# Patient Record
Sex: Male | Born: 1935 | Race: White | Hispanic: No | Smoking: Never smoker
Health system: Southern US, Community
[De-identification: ages and names within clinical notes are randomized; demographics above are authoritative.]

## PROBLEM LIST (undated history)

## (undated) DIAGNOSIS — I639 Cerebral infarction, unspecified: Secondary | ICD-10-CM

## (undated) DIAGNOSIS — E785 Hyperlipidemia, unspecified: Secondary | ICD-10-CM

## (undated) DIAGNOSIS — J189 Pneumonia, unspecified organism: Secondary | ICD-10-CM

## (undated) DIAGNOSIS — I482 Chronic atrial fibrillation, unspecified: Secondary | ICD-10-CM

## (undated) DIAGNOSIS — I10 Essential (primary) hypertension: Secondary | ICD-10-CM

## (undated) DIAGNOSIS — N183 Chronic kidney disease, stage 3 unspecified: Secondary | ICD-10-CM

## (undated) DIAGNOSIS — I4891 Unspecified atrial fibrillation: Secondary | ICD-10-CM

## (undated) DIAGNOSIS — Z8719 Personal history of other diseases of the digestive system: Secondary | ICD-10-CM

## (undated) HISTORY — PX: JOINT REPLACEMENT: SHX530

---

## 1995-08-29 HISTORY — PX: ROTATOR CUFF REPAIR: SHX139

## 1998-12-30 ENCOUNTER — Emergency Department (HOSPITAL_COMMUNITY): Admission: EM | Admit: 1998-12-30 | Discharge: 1998-12-30 | Payer: Self-pay

## 2002-06-22 ENCOUNTER — Encounter: Payer: Self-pay | Admitting: *Deleted

## 2002-06-22 ENCOUNTER — Ambulatory Visit (HOSPITAL_COMMUNITY): Admission: RE | Admit: 2002-06-22 | Discharge: 2002-06-22 | Payer: Self-pay | Admitting: *Deleted

## 2004-05-01 ENCOUNTER — Encounter: Admission: RE | Admit: 2004-05-01 | Discharge: 2004-05-01 | Payer: Self-pay | Admitting: Orthopedic Surgery

## 2004-05-02 ENCOUNTER — Encounter: Admission: RE | Admit: 2004-05-02 | Discharge: 2004-05-02 | Payer: Self-pay | Admitting: Orthopedic Surgery

## 2006-10-28 HISTORY — PX: ANAL FISTULOTOMY: SHX6423

## 2006-11-04 ENCOUNTER — Ambulatory Visit (HOSPITAL_COMMUNITY): Admission: RE | Admit: 2006-11-04 | Discharge: 2006-11-04 | Payer: Self-pay | Admitting: General Surgery

## 2007-03-29 HISTORY — PX: TOTAL KNEE ARTHROPLASTY: SHX125

## 2007-04-27 ENCOUNTER — Inpatient Hospital Stay (HOSPITAL_COMMUNITY): Admission: RE | Admit: 2007-04-27 | Discharge: 2007-05-04 | Payer: Self-pay | Admitting: Orthopedic Surgery

## 2007-07-23 ENCOUNTER — Ambulatory Visit: Payer: Self-pay | Admitting: Gastroenterology

## 2007-07-27 ENCOUNTER — Encounter: Payer: Self-pay | Admitting: Gastroenterology

## 2007-07-27 ENCOUNTER — Ambulatory Visit (HOSPITAL_COMMUNITY): Admission: RE | Admit: 2007-07-27 | Discharge: 2007-07-27 | Payer: Self-pay | Admitting: Gastroenterology

## 2007-07-29 ENCOUNTER — Ambulatory Visit: Payer: Self-pay | Admitting: Gastroenterology

## 2008-01-08 DIAGNOSIS — D518 Other vitamin B12 deficiency anemias: Secondary | ICD-10-CM | POA: Insufficient documentation

## 2008-01-08 DIAGNOSIS — K603 Anal fistula: Secondary | ICD-10-CM | POA: Insufficient documentation

## 2008-04-19 ENCOUNTER — Emergency Department (HOSPITAL_COMMUNITY): Admission: EM | Admit: 2008-04-19 | Discharge: 2008-04-19 | Payer: Self-pay | Admitting: Emergency Medicine

## 2008-04-20 ENCOUNTER — Inpatient Hospital Stay (HOSPITAL_COMMUNITY): Admission: RE | Admit: 2008-04-20 | Discharge: 2008-04-22 | Payer: Self-pay | Admitting: Emergency Medicine

## 2008-04-20 DIAGNOSIS — I639 Cerebral infarction, unspecified: Secondary | ICD-10-CM

## 2008-04-20 HISTORY — DX: Cerebral infarction, unspecified: I63.9

## 2008-04-21 ENCOUNTER — Encounter (INDEPENDENT_AMBULATORY_CARE_PROVIDER_SITE_OTHER): Payer: Self-pay | Admitting: Internal Medicine

## 2008-04-21 ENCOUNTER — Ambulatory Visit: Payer: Self-pay | Admitting: Vascular Surgery

## 2009-03-28 HISTORY — PX: TOTAL KNEE ARTHROPLASTY: SHX125

## 2009-04-10 ENCOUNTER — Inpatient Hospital Stay (HOSPITAL_COMMUNITY): Admission: RE | Admit: 2009-04-10 | Discharge: 2009-04-18 | Payer: Self-pay | Admitting: Orthopedic Surgery

## 2009-12-13 ENCOUNTER — Emergency Department (HOSPITAL_COMMUNITY): Admission: EM | Admit: 2009-12-13 | Discharge: 2009-12-13 | Payer: Self-pay | Admitting: Emergency Medicine

## 2010-01-23 ENCOUNTER — Inpatient Hospital Stay (HOSPITAL_COMMUNITY): Admission: EM | Admit: 2010-01-23 | Discharge: 2010-01-24 | Payer: Self-pay | Admitting: Emergency Medicine

## 2010-01-29 ENCOUNTER — Telehealth (INDEPENDENT_AMBULATORY_CARE_PROVIDER_SITE_OTHER): Payer: Self-pay | Admitting: *Deleted

## 2010-08-21 ENCOUNTER — Emergency Department (HOSPITAL_COMMUNITY): Admission: EM | Admit: 2010-08-21 | Discharge: 2010-08-21 | Payer: Self-pay | Admitting: Emergency Medicine

## 2010-11-18 ENCOUNTER — Encounter: Payer: Self-pay | Admitting: Emergency Medicine

## 2010-11-27 NOTE — Procedures (Signed)
Summary: Gastroenterology colon  Gastroenterology colon   Imported By: Donneta Romberg 01/08/2008 16:45:09  _____________________________________________________________________  External Attachment:    Type:   Image     Comment:   External Document

## 2010-11-27 NOTE — Progress Notes (Signed)
  Phone Note Other Incoming   Request: Send information Summary of Call: Request received from [facility] forwarded to [employer].       

## 2011-01-20 LAB — PROTIME-INR
INR: 1.2 (ref 0.00–1.49)
Prothrombin Time: 15.1 seconds (ref 11.6–15.2)

## 2011-01-20 LAB — CBC
HCT: 34 % — ABNORMAL LOW (ref 39.0–52.0)
HCT: 35 % — ABNORMAL LOW (ref 39.0–52.0)
HCT: 39.2 % (ref 39.0–52.0)
Hemoglobin: 11.5 g/dL — ABNORMAL LOW (ref 13.0–17.0)
Hemoglobin: 11.8 g/dL — ABNORMAL LOW (ref 13.0–17.0)
Hemoglobin: 13.2 g/dL (ref 13.0–17.0)
MCHC: 32.9 g/dL (ref 30.0–36.0)
MCHC: 33.6 g/dL (ref 30.0–36.0)
MCHC: 34.7 g/dL (ref 30.0–36.0)
MCV: 94.5 fL (ref 78.0–100.0)
MCV: 96.1 fL (ref 78.0–100.0)
MCV: 96.4 fL (ref 78.0–100.0)
Platelets: 173 10*3/uL (ref 150–400)
Platelets: 189 10*3/uL (ref 150–400)
Platelets: 249 10*3/uL (ref 150–400)
RBC: 3.6 MIL/uL — ABNORMAL LOW (ref 4.22–5.81)
RBC: 3.64 MIL/uL — ABNORMAL LOW (ref 4.22–5.81)
RBC: 4.07 MIL/uL — ABNORMAL LOW (ref 4.22–5.81)
RDW: 12.5 % (ref 11.5–15.5)
RDW: 12.5 % (ref 11.5–15.5)
RDW: 13.3 % (ref 11.5–15.5)
WBC: 12.1 10*3/uL — ABNORMAL HIGH (ref 4.0–10.5)
WBC: 18.2 10*3/uL — ABNORMAL HIGH (ref 4.0–10.5)
WBC: 20.9 10*3/uL — ABNORMAL HIGH (ref 4.0–10.5)

## 2011-01-20 LAB — HEMOCCULT GUIAC POC 1CARD (OFFICE)
Fecal Occult Bld: NEGATIVE
Fecal Occult Bld: POSITIVE

## 2011-01-20 LAB — URINALYSIS, ROUTINE W REFLEX MICROSCOPIC
Bilirubin Urine: NEGATIVE
Glucose, UA: NEGATIVE mg/dL
Hgb urine dipstick: NEGATIVE
Ketones, ur: NEGATIVE mg/dL
Leukocytes, UA: NEGATIVE
Nitrite: NEGATIVE
Protein, ur: 30 mg/dL — AB
Specific Gravity, Urine: 1.018 (ref 1.005–1.030)
Urobilinogen, UA: 1 mg/dL (ref 0.0–1.0)
pH: 5 (ref 5.0–8.0)

## 2011-01-20 LAB — COMPREHENSIVE METABOLIC PANEL
ALT: 16 U/L (ref 0–53)
ALT: 19 U/L (ref 0–53)
AST: 29 U/L (ref 0–37)
AST: 35 U/L (ref 0–37)
Albumin: 2.7 g/dL — ABNORMAL LOW (ref 3.5–5.2)
Albumin: 3.6 g/dL (ref 3.5–5.2)
Alkaline Phosphatase: 47 U/L (ref 39–117)
Alkaline Phosphatase: 70 U/L (ref 39–117)
BUN: 30 mg/dL — ABNORMAL HIGH (ref 6–23)
BUN: 31 mg/dL — ABNORMAL HIGH (ref 6–23)
CO2: 16 mEq/L — ABNORMAL LOW (ref 19–32)
CO2: 17 mEq/L — ABNORMAL LOW (ref 19–32)
Calcium: 7.2 mg/dL — ABNORMAL LOW (ref 8.4–10.5)
Calcium: 8.6 mg/dL (ref 8.4–10.5)
Chloride: 112 mEq/L (ref 96–112)
Chloride: 120 mEq/L — ABNORMAL HIGH (ref 96–112)
Creatinine, Ser: 1.76 mg/dL — ABNORMAL HIGH (ref 0.4–1.5)
Creatinine, Ser: 2 mg/dL — ABNORMAL HIGH (ref 0.4–1.5)
GFR calc Af Amer: 39 mL/min — ABNORMAL LOW (ref >60)
GFR calc Af Amer: 45 mL/min — ABNORMAL LOW (ref >60)
GFR calc non Af Amer: 32 mL/min — ABNORMAL LOW (ref >60)
GFR calc non Af Amer: 37 mL/min — ABNORMAL LOW (ref >60)
Glucose, Bld: 130 mg/dL — ABNORMAL HIGH (ref 70–99)
Glucose, Bld: 136 mg/dL — ABNORMAL HIGH (ref 70–99)
Potassium: 4.3 mEq/L (ref 3.5–5.1)
Potassium: 4.4 mEq/L (ref 3.5–5.1)
Sodium: 142 mEq/L (ref 135–145)
Sodium: 142 mEq/L (ref 135–145)
Total Bilirubin: 0.4 mg/dL (ref 0.3–1.2)
Total Bilirubin: 0.4 mg/dL (ref 0.3–1.2)
Total Protein: 5.3 g/dL — ABNORMAL LOW (ref 6.0–8.3)
Total Protein: 6.9 g/dL (ref 6.0–8.3)

## 2011-01-20 LAB — TYPE AND SCREEN
ABO/RH(D): O POS
Antibody Screen: NEGATIVE

## 2011-01-20 LAB — POCT CARDIAC MARKERS
CKMB, poc: <1 ng/mL — ABNORMAL LOW (ref 1.0–8.0)
Myoglobin, poc: >500 ng/mL (ref 12–200)
Troponin i, poc: <0.05 ng/mL (ref 0.00–0.09)

## 2011-01-20 LAB — LACTIC ACID, PLASMA
Lactic Acid, Venous: 2.1 mmol/L (ref 0.5–2.2)
Lactic Acid, Venous: 5.5 mmol/L — ABNORMAL HIGH (ref 0.5–2.2)

## 2011-01-20 LAB — BASIC METABOLIC PANEL
BUN: 17 mg/dL (ref 6–23)
CO2: 25 mEq/L (ref 19–32)
Calcium: 7.5 mg/dL — ABNORMAL LOW (ref 8.4–10.5)
Chloride: 108 mEq/L (ref 96–112)
Creatinine, Ser: 1.32 mg/dL (ref 0.4–1.5)
GFR calc Af Amer: >60 mL/min (ref >60)
GFR calc non Af Amer: 51 mL/min — ABNORMAL LOW (ref >60)
Glucose, Bld: 111 mg/dL — ABNORMAL HIGH (ref 70–99)
Potassium: 3.4 mEq/L — ABNORMAL LOW (ref 3.5–5.1)
Sodium: 138 mEq/L (ref 135–145)

## 2011-01-20 LAB — DIFFERENTIAL
Basophils Absolute: 0 10*3/uL (ref 0.0–0.1)
Basophils Absolute: 0 10*3/uL (ref 0.0–0.1)
Basophils Absolute: 0.2 10*3/uL — ABNORMAL HIGH (ref 0.0–0.1)
Basophils Relative: 0 % (ref 0–1)
Basophils Relative: 0 % (ref 0–1)
Basophils Relative: 1 % (ref 0–1)
Eosinophils Absolute: 0 10*3/uL (ref 0.0–0.7)
Eosinophils Absolute: 0 10*3/uL (ref 0.0–0.7)
Eosinophils Absolute: 0.1 10*3/uL (ref 0.0–0.7)
Eosinophils Relative: 0 % (ref 0–5)
Eosinophils Relative: 0 % (ref 0–5)
Eosinophils Relative: 0 % (ref 0–5)
Lymphocytes Relative: 3 % — ABNORMAL LOW (ref 12–46)
Lymphocytes Relative: 8 % — ABNORMAL LOW (ref 12–46)
Lymphocytes Relative: 9 % — ABNORMAL LOW (ref 12–46)
Lymphs Abs: 0.5 10*3/uL — ABNORMAL LOW (ref 0.7–4.0)
Lymphs Abs: 1 10*3/uL (ref 0.7–4.0)
Lymphs Abs: 1.9 10*3/uL (ref 0.7–4.0)
Monocytes Absolute: 0.4 10*3/uL (ref 0.1–1.0)
Monocytes Absolute: 0.6 10*3/uL (ref 0.1–1.0)
Monocytes Absolute: 0.7 10*3/uL (ref 0.1–1.0)
Monocytes Relative: 2 % — ABNORMAL LOW (ref 3–12)
Monocytes Relative: 4 % (ref 3–12)
Monocytes Relative: 5 % (ref 3–12)
Neutro Abs: 10.4 10*3/uL — ABNORMAL HIGH (ref 1.7–7.7)
Neutro Abs: 16.8 10*3/uL — ABNORMAL HIGH (ref 1.7–7.7)
Neutro Abs: 18.5 10*3/uL — ABNORMAL HIGH (ref 1.7–7.7)
Neutrophils Relative %: 86 % — ABNORMAL HIGH (ref 43–77)
Neutrophils Relative %: 88 % — ABNORMAL HIGH (ref 43–77)
Neutrophils Relative %: 92 % — ABNORMAL HIGH (ref 43–77)

## 2011-01-20 LAB — URINE MICROSCOPIC-ADD ON

## 2011-01-20 LAB — CULTURE, BLOOD (ROUTINE X 2)
Culture: NO GROWTH
Culture: NO GROWTH

## 2011-01-20 LAB — RAPID URINE DRUG SCREEN, HOSP PERFORMED
Amphetamines: NOT DETECTED
Barbiturates: NOT DETECTED
Benzodiazepines: POSITIVE — AB
Cocaine: NOT DETECTED
Opiates: POSITIVE — AB
Tetrahydrocannabinol: NOT DETECTED

## 2011-01-20 LAB — DIGOXIN LEVEL: Digoxin Level: 0.2 ng/mL — ABNORMAL LOW (ref 0.8–2.0)

## 2011-01-20 LAB — AMYLASE: Amylase: 164 U/L — ABNORMAL HIGH (ref 0–105)

## 2011-01-20 LAB — CREATININE, URINE, RANDOM: Creatinine, Urine: 97 mg/dL

## 2011-01-20 LAB — APTT: aPTT: 27 seconds (ref 24–37)

## 2011-01-20 LAB — SODIUM, URINE, RANDOM: Sodium, Ur: 92 mEq/L

## 2011-01-20 LAB — LIPASE, BLOOD
Lipase: 25 U/L (ref 11–59)
Lipase: 844 U/L — ABNORMAL HIGH (ref 11–59)
Lipase: 98 U/L — ABNORMAL HIGH (ref 11–59)

## 2011-01-20 LAB — MRSA PCR SCREENING: MRSA by PCR: NEGATIVE

## 2011-01-20 LAB — LACTATE DEHYDROGENASE: LDH: 137 U/L (ref 94–250)

## 2011-02-04 LAB — CBC
HCT: 23.9 % — ABNORMAL LOW (ref 39.0–52.0)
HCT: 25.3 % — ABNORMAL LOW (ref 39.0–52.0)
HCT: 27.4 % — ABNORMAL LOW (ref 39.0–52.0)
HCT: 27.7 % — ABNORMAL LOW (ref 39.0–52.0)
HCT: 31.3 % — ABNORMAL LOW (ref 39.0–52.0)
HCT: 29.3 % — ABNORMAL LOW (ref 39.0–52.0)
HCT: 36.5 % — ABNORMAL LOW (ref 39.0–52.0)
Hemoglobin: 10.6 g/dL — ABNORMAL LOW (ref 13.0–17.0)
Hemoglobin: 8.2 g/dL — ABNORMAL LOW (ref 13.0–17.0)
Hemoglobin: 8.7 g/dL — ABNORMAL LOW (ref 13.0–17.0)
Hemoglobin: 9.4 g/dL — ABNORMAL LOW (ref 13.0–17.0)
Hemoglobin: 9.4 g/dL — ABNORMAL LOW (ref 13.0–17.0)
Hemoglobin: 12.9 g/dL — ABNORMAL LOW (ref 13.0–17.0)
Hemoglobin: 9.9 g/dL — ABNORMAL LOW (ref 13.0–17.0)
MCHC: 33.8 g/dL (ref 30.0–36.0)
MCHC: 33.9 g/dL (ref 30.0–36.0)
MCHC: 34.1 g/dL (ref 30.0–36.0)
MCHC: 34.3 g/dL (ref 30.0–36.0)
MCHC: 34.4 g/dL (ref 30.0–36.0)
MCHC: 33.6 g/dL (ref 30.0–36.0)
MCHC: 35.5 g/dL (ref 30.0–36.0)
MCV: 93.7 fL (ref 78.0–100.0)
MCV: 94.7 fL (ref 78.0–100.0)
MCV: 94.8 fL (ref 78.0–100.0)
MCV: 94.8 fL (ref 78.0–100.0)
MCV: 94.9 fL (ref 78.0–100.0)
MCV: 93.9 fL (ref 78.0–100.0)
MCV: 94.6 fL (ref 78.0–100.0)
Platelets: 164 10*3/uL (ref 150–400)
Platelets: 168 10*3/uL (ref 150–400)
Platelets: 174 10*3/uL (ref 150–400)
Platelets: 193 10*3/uL (ref 150–400)
Platelets: 433 10*3/uL — ABNORMAL HIGH (ref 150–400)
Platelets: 195 10*3/uL (ref 150–400)
Platelets: 222 10*3/uL (ref 150–400)
RBC: 2.52 MIL/uL — ABNORMAL LOW (ref 4.22–5.81)
RBC: 2.67 MIL/uL — ABNORMAL LOW (ref 4.22–5.81)
RBC: 2.9 MIL/uL — ABNORMAL LOW (ref 4.22–5.81)
RBC: 2.92 MIL/uL — ABNORMAL LOW (ref 4.22–5.81)
RBC: 3.34 MIL/uL — ABNORMAL LOW (ref 4.22–5.81)
RBC: 3.12 MIL/uL — ABNORMAL LOW (ref 4.22–5.81)
RBC: 3.85 MIL/uL — ABNORMAL LOW (ref 4.22–5.81)
RDW: 12.3 % (ref 11.5–15.5)
RDW: 12.5 % (ref 11.5–15.5)
RDW: 12.6 % (ref 11.5–15.5)
RDW: 12.7 % (ref 11.5–15.5)
RDW: 13 % (ref 11.5–15.5)
RDW: 11.6 % (ref 11.5–15.5)
RDW: 11.8 % (ref 11.5–15.5)
WBC: 10.1 10*3/uL (ref 4.0–10.5)
WBC: 10.4 10*3/uL (ref 4.0–10.5)
WBC: 11.2 10*3/uL — ABNORMAL HIGH (ref 4.0–10.5)
WBC: 8.1 10*3/uL (ref 4.0–10.5)
WBC: 9.6 10*3/uL (ref 4.0–10.5)
WBC: 5.9 10*3/uL (ref 4.0–10.5)
WBC: 7 10*3/uL (ref 4.0–10.5)

## 2011-02-04 LAB — URINE CULTURE
Colony Count: NO GROWTH
Culture: NO GROWTH
Special Requests: NEGATIVE

## 2011-02-04 LAB — URINALYSIS, ROUTINE W REFLEX MICROSCOPIC
Bilirubin Urine: NEGATIVE
Glucose, UA: NEGATIVE mg/dL
Glucose, UA: NEGATIVE mg/dL
Hgb urine dipstick: NEGATIVE
Ketones, ur: NEGATIVE mg/dL
Leukocytes, UA: NEGATIVE
Nitrite: NEGATIVE
Nitrite: NEGATIVE
Protein, ur: 30 mg/dL — AB
Protein, ur: NEGATIVE mg/dL
Specific Gravity, Urine: 1.019 (ref 1.005–1.030)
Specific Gravity, Urine: 1.024 (ref 1.005–1.030)
Urobilinogen, UA: 1 mg/dL (ref 0.0–1.0)
Urobilinogen, UA: 1 mg/dL (ref 0.0–1.0)
pH: 5.5 (ref 5.0–8.0)
pH: 5 (ref 5.0–8.0)

## 2011-02-04 LAB — CROSSMATCH
ABO/RH(D): O POS
Antibody Screen: NEGATIVE

## 2011-02-04 LAB — PROTIME-INR
INR: 1.6 — ABNORMAL HIGH (ref 0.00–1.49)
INR: 1.6 — ABNORMAL HIGH (ref 0.00–1.49)
INR: 1.7 — ABNORMAL HIGH (ref 0.00–1.49)
INR: 1 (ref 0.00–1.49)
INR: 1.3 (ref 0.00–1.49)
Prothrombin Time: 19.3 seconds — ABNORMAL HIGH (ref 11.6–15.2)
Prothrombin Time: 20 seconds — ABNORMAL HIGH (ref 11.6–15.2)
Prothrombin Time: 21 seconds — ABNORMAL HIGH (ref 11.6–15.2)
Prothrombin Time: 13.4 seconds (ref 11.6–15.2)
Prothrombin Time: 16.3 seconds — ABNORMAL HIGH (ref 11.6–15.2)

## 2011-02-04 LAB — CULTURE, BLOOD (ROUTINE X 2)
Culture: NO GROWTH
Culture: NO GROWTH

## 2011-02-04 LAB — CARDIAC PANEL(CRET KIN+CKTOT+MB+TROPI)
CK, MB: 1.5 ng/mL (ref 0.3–4.0)
CK, MB: 2.1 ng/mL (ref 0.3–4.0)
Relative Index: 0.9 (ref 0.0–2.5)
Relative Index: 0.9 (ref 0.0–2.5)
Total CK: 166 U/L (ref 7–232)
Total CK: 242 U/L — ABNORMAL HIGH (ref 7–232)
Troponin I: 0.08 ng/mL — ABNORMAL HIGH (ref 0.00–0.06)
Troponin I: 0.08 ng/mL — ABNORMAL HIGH (ref 0.00–0.06)

## 2011-02-04 LAB — BASIC METABOLIC PANEL
BUN: 14 mg/dL (ref 6–23)
BUN: 19 mg/dL (ref 6–23)
CO2: 24 mEq/L (ref 19–32)
CO2: 25 mEq/L (ref 19–32)
Calcium: 7.8 mg/dL — ABNORMAL LOW (ref 8.4–10.5)
Calcium: 7.8 mg/dL — ABNORMAL LOW (ref 8.4–10.5)
Chloride: 106 mEq/L (ref 96–112)
Chloride: 106 mEq/L (ref 96–112)
Creatinine, Ser: 1.4 mg/dL (ref 0.4–1.5)
Creatinine, Ser: 1.22 mg/dL (ref 0.4–1.5)
GFR calc Af Amer: 58 mL/min — ABNORMAL LOW (ref >60)
GFR calc Af Amer: >60 mL/min (ref >60)
GFR calc non Af Amer: 48 mL/min — ABNORMAL LOW (ref >60)
GFR calc non Af Amer: 56 mL/min — ABNORMAL LOW (ref >60)
Glucose, Bld: 102 mg/dL — ABNORMAL HIGH (ref 70–99)
Glucose, Bld: 131 mg/dL — ABNORMAL HIGH (ref 70–99)
Potassium: 3.9 mEq/L (ref 3.5–5.1)
Potassium: 4 mEq/L (ref 3.5–5.1)
Sodium: 135 mEq/L (ref 135–145)
Sodium: 135 mEq/L (ref 135–145)

## 2011-02-04 LAB — COMPREHENSIVE METABOLIC PANEL
ALT: 11 U/L (ref 0–53)
ALT: 14 U/L (ref 0–53)
AST: 22 U/L (ref 0–37)
AST: 28 U/L (ref 0–37)
Albumin: 2.1 g/dL — ABNORMAL LOW (ref 3.5–5.2)
Albumin: 3.5 g/dL (ref 3.5–5.2)
Alkaline Phosphatase: 46 U/L (ref 39–117)
Alkaline Phosphatase: 71 U/L (ref 39–117)
BUN: 17 mg/dL (ref 6–23)
BUN: 31 mg/dL — ABNORMAL HIGH (ref 6–23)
CO2: 25 mEq/L (ref 19–32)
CO2: 24 mEq/L (ref 19–32)
Calcium: 7.7 mg/dL — ABNORMAL LOW (ref 8.4–10.5)
Calcium: 9.2 mg/dL (ref 8.4–10.5)
Chloride: 105 mEq/L (ref 96–112)
Chloride: 110 mEq/L (ref 96–112)
Creatinine, Ser: 1.55 mg/dL — ABNORMAL HIGH (ref 0.4–1.5)
Creatinine, Ser: 1.53 mg/dL — ABNORMAL HIGH (ref 0.4–1.5)
GFR calc Af Amer: 52 mL/min — ABNORMAL LOW (ref >60)
GFR calc Af Amer: 53 mL/min — ABNORMAL LOW (ref >60)
GFR calc non Af Amer: 43 mL/min — ABNORMAL LOW (ref >60)
GFR calc non Af Amer: 43 mL/min — ABNORMAL LOW (ref >60)
Glucose, Bld: 133 mg/dL — ABNORMAL HIGH (ref 70–99)
Glucose, Bld: 108 mg/dL — ABNORMAL HIGH (ref 70–99)
Potassium: 3.9 mEq/L (ref 3.5–5.1)
Potassium: 4.5 mEq/L (ref 3.5–5.1)
Sodium: 134 mEq/L — ABNORMAL LOW (ref 135–145)
Sodium: 142 mEq/L (ref 135–145)
Total Bilirubin: 0.8 mg/dL (ref 0.3–1.2)
Total Bilirubin: 0.7 mg/dL (ref 0.3–1.2)
Total Protein: 5.2 g/dL — ABNORMAL LOW (ref 6.0–8.3)
Total Protein: 6.9 g/dL (ref 6.0–8.3)

## 2011-02-04 LAB — URINE MICROSCOPIC-ADD ON

## 2011-02-04 LAB — APTT: aPTT: 27 seconds (ref 24–37)

## 2011-02-04 LAB — TYPE AND SCREEN
ABO/RH(D): O POS
Antibody Screen: NEGATIVE

## 2011-03-12 NOTE — Discharge Summary (Signed)
NAME:  [name], [name] NO.:  [id number]   MEDICAL RECORD NO.:  [id number]          PATIENT TYPE:  INP   LOCATION:  1406                         FACILITY:  [facility]   PHYSICIAN:  Gaspar Garbe, M.D.DATE OF BIRTH:  1922/04/19   DATE OF ADMISSION:  04/20/2008  DATE OF DISCHARGE:                               DISCHARGE SUMMARY   ADMISSION DIAGNOSES:  1. 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  2. Moderate carotid artery disease.  3. Negative echocardiogram with clot.  4. Hypertension.  5. Hyperlipidemia.  6. B12 deficiency.  7. __________carotid artery, stenosed 40%right greater than left.   Echocardiogram shows a normal ejection fraction of 98%.  Clot noted.  Laboratory testsshowed B12 low at 104, TSH normal at 4.99.  C reactive  protein negative at 0.1.  Elevated homocysteine 21.5.  Hemoglobin A1C  5%.  Hemoglobin A1c 5.5%.  Lipid panel:  Cholesterol 152, LDL 109, HDL  57, triglycerides 80.  Sed rate was slightly elevated at 18.  Comprehensive metabolic panel shows BUN and creatinine of 25and  __________ respectively with normalelectrolytes.  CBC normal with white  count of 7, hemoglobin 12.5, hematocrit 36.6, platelets are 39. Marylene Land  are __________ on admission with ___________ obtained at discharge.   PHYSICAL EXAMINATION:  VITAL SIGNS:  Temperature 97, pulse 56, oxygen  saturation 20, blood pressure 126/70, 95% on room air.  HEENT:  Head is normocephalic and atraumatic.  PERRLA.  EOMI.  Mucous  membranes are moist.  NECK:  Supple.  No JVD.  No bruits appreciated.  HEART:  Regular rate and rhythm.  No murmurs, rubs or gallops.  LUNGS:  Clear to auscultation bilaterally.  ABDOMEN:  Soft, nontender, normoactive bowel sounds.   HOSPITAL COURSE:  [name] came to the emergency room with right  upper extremity weakness mostly at the shoulder.  [name] has had  multiple shoulder complaints and surgery in the past and negative  CT's  __________ at that time.  Instructed to take a __________ and follow  with MRI subsequently. __________workup  echocardiogram and carotids without the aforementioned workup.  Neurologic consultation with Dr. __________ as well.  The patient did  not have any recurrence of symptoms.  __________   Follow up in this office __________blood pressure monitor.      Gaspar Garbe, M.D.  Electronically Signed     RWT/MEDQ  D:  04/22/2008  T:  04/22/2008  Job:  [id number]   cc:   Levert Feinstein, MD

## 2011-03-12 NOTE — Discharge Summary (Signed)
NAME:  [name], MASELLI NO.:  [id number]   MEDICAL RECORD NO.:  [id number]          PATIENT TYPE:  INP   LOCATION:  1401                         FACILITY:  [facility]   PHYSICIAN:  Ollen Gross, M.D.    DATE OF BIRTH:  Jan 01, 1922   DATE OF ADMISSION:  04/10/2009  DATE OF DISCHARGE:  04/18/2009                               DISCHARGE SUMMARY   ADMITTING DIAGNOSES:  1. Osteoarthritis of the left knee.  2. Seasonal allergies.  3. History of bronchitis.  4. Hiatal hernia.  5. Reflux disease.  6. History of stroke, June 2009.   DISCHARGE DIAGNOSES:  1. Osteoarthritis of the left knee, status post left total knee      replacement arthroplasty.  2. Postop acute blood loss anemia.  3. Status post transfusion without sequela.  4. Postop supraventricular tachycardia.  5. Postop atrial flutter on top of supraventricular tachycardia.  6. Seasonal allergies.  7. History of bronchitis.  8. Hiatal hernia.  9. Reflux disease.  10.History of stroke, June 2009.   PROCEDURE:  April 10, 2009, left total knee.  Surgeon, Dr. Lequita Halt.  Assistant, Dr. Worthy Rancher.  Anesthesia, general.  Tourniquet time, 35  minutes.   CONSULTS:  Cardiology, Dr. Patty Sermons.   BRIEF HISTORY:  [name] is an 77 year old male with severe end-stage  arthritis of the left knee, progressively worsening pain and  dysfunction, successful right total knee, now presents for left total  knee.   LABORATORY DATA:  Preop CBC showed hemoglobin of 12.9, hematocrit of  36.5, white cell count 5.9, platelets 222, PT/INR 13.4 and 1.0 with a  PTT of 27.  Chem panel on admission slightly elevated BUN of 31 and  elevated creatinine of 1.5 preop, remaining chem panel within normal  limits.  Preop UA was negative.  Serial CBCs were followed.  Hemoglobin  dropped to 9.9 then 9.4 drifted down, got as low as 8.2, given 2 units  of blood, last checked hemoglobin and hematocrit prior to discharge was  hemoglobin 10.6 and  hematocrit of 31.3.  Serial BMETs were followed.  Sodium did drop down from 142 to 135, last noted was 134.  He did have 2  sets of cardiac enzymes drawn, one on April 12, 2009, and one on April 13, 2009.  The first set showed elevated CK of 241, normal MB of 2.1, normal  index of 0.9, and troponin slightly elevated at 0.08.  Second set  normal, CK of 166, normal MB of 1.5, relative index 0.9, troponin  slightly elevated at 0.08.  He had a UA checked on April 12, 2009, also  which showed some small bili, trace ketones, trace blood, few bacteria,  and 3 to 6 red cells.  Urine culture, no growth.  Blood cultures x2, no  growth to date.  X-rays, 2 view, chest, April 04, 2009, large hiatal  hernia, no active cardiopulmonary disease.  EKG, April 04, 2009, sinus  rhythm with premature atrial complexes, otherwise normal, confirmed, Dr.  Armanda Magic.  EKG on April 12, 2009, atrial flutter, variable AV block,  nonspecific ST abnormality, abnormal QR/ST  angle, consider primary T-  wave abnormality, confirmed by Dr. Patty Sermons.  Followup EKG on April 13, 2009, normal sinus rhythm with frequent PVCs, Dr. Patty Sermons.  Followup  EKG, April 15, 2009, atrial fibrillation, rapid ventricular response,  confirmed by Dr. Garen Lah.   HOSPITAL COURSE:  Patient admitted to [facility], taken to the  OR, underwent the above-stated procedure without complication.  Patient  tolerated procedure well, later transferred to recovery room on  Orthopedic floor, started on PCA and p.o. analgesics.  Doing pretty well  on the morning of day 1.  Started getting up out of bed.  Weaned off his  PCA and switched him over to p.o. meds.  Hep locked the IV once he was  taking p.o. as well.  He initially wanted to go to skilled nursing  facility at [place] so we got social work involved.  They were going  to start looking in for placement.  By day 2, he was a little bit  nauseated with a Percocet so we switched that to Dilaudid  which he did a  little bit better.  Dressing changed.  Incision healing well.  Hemoglobin was 9.4.  He was asymptomatic with the anemia.  We started  him on Coumadin protocol.  We were going to make arrangements for him to  go to the skilled nursing facility over the next day or two.  An interim  discharge was done and just waiting on a bed.  Unfortunately, on the  late evening of the postop day 2, he went in to some abnormal cardiac  rhythm and he was moved to monitor floor.  He was seen by Dr. Patty Sermons  and found to have SVT with probable underlying atrial flutter.  He was  started on IV Cardizem drip which did control his rate and by the  following day he was back into sinus rhythm with frequent PACs.  He was  feeling much better.  Hemoglobin was down.  Continued to monitor.  Switched him over to oral Cardizem by the next day and he was placed on  iron supplement.  From a therapy standpoint, once the cardiac issues  resolved, he started getting up and walking with therapy, walking about  50 feet, doing pretty well but then the following day he refused to get  up with therapy.  By April 15, 2009, rhythm continued to vary between  sinus rhythm and atrial fib which was confirmed by another EKG on April 15, 2009.  Patient at that point was refusing therapy, had refused some  blood draws, did not know what his INR was.  The hemoglobin had dropped  a little bit further and he had received a transfusion on the evening of  April 14, 2009, and did not allow his blood to be taken on the following  day of April 15, 2009.  Due to the inability to monitor the Coumadin  safely and effectively, the Coumadin was discontinued and switched over  to Lovenox injections.  Following the Lovenox injections, he did have a  little bit more swelling and ecchymosis develop in the posterior aspect  of the leg and just had a little bit of oozing and bleeding in to the  tissues.  He stayed in the hospital through the  end of the week and into  the weekend for close monitoring.  He had started to refuse his cardiac  meds at that time.  Over the weekend, Dr. Patty Sermons placed cardiac  prescriptions in  the form of Cardizem and also low-dose Lanoxin on his  chart, did recommend that the patient take these on an outpatient basis  and the patient told him at that time that if he still had problems on  an outpatient basis he would make a decision whether he would follow up  at that point but did not know if he would take his meds.  They were  recommended to be filled.  With the extra little bit of oozing and  swelling into the leg, he had started to develop erythema in and around  which was felt to be due to locally irritation from the blood and he was  placed on Keflex for coverage.  He was seen back on the rounds on the  April 17, 2009, on the following Monday.  He still had a fair amount of  erythema but it appeared to be stable.  He discussed with Dr. Lequita Halt  that he had decided he wanted to go home so we wanted to make sure we  got another full day of therapy to be evaluated because the original  plan was him to go home and he had made arrangements with a friend to  have some help at home.  We decided to set up home arrangements.  He  stayed until the next day of April 18, 2009, and he was seen on rounds,  doing well, no complaints.  His rate was high 90s up to 105.  He did  allow his blood to be drawn that last day of the hospital stay and his  hemoglobin was stable at 10.6 but he was asymptomatic with any of the  odd rhythms.  He progressed well with Physical Therapy and was  discharged home at that time.   DISCHARGE PLAN:  1. Patient discharged home on April 18, 2009.  2. Discharge diagnoses:  Please see above.  3. Discharge meds:  Keflex for 7 days, Lovenox for 6 more days,      Lanoxin prescription per Dr. Patty Sermons, Cardizem prescription per      Dr. Patty Sermons, Dilaudid for pain, Robaxin for  spasm.   DIET:  Cardiac, heart-healthy diet.   ACTIVITY:  He is weightbearing as tolerated to the left lower extremity.  Home health PT for home therapy.  We will make those arrangements before  he goes home.  Walker for mobility.  He may start showering, however, do  not submerge the incision under water.   DISPOSITION:  Home.   CONDITION UPON DISCHARGE:  Improving.      Alexzandrew L. Perkins, P.A.C.      Ollen Gross, M.D.  Electronically Signed    ALP/MEDQ  D:  04/18/2009  T:  04/18/2009  Job:  [id number]   cc:   Ollen Gross, M.D.  Fax: [phone]   Cassell Clement, M.D.  Fax: [phone]   Gaspar Garbe, M.D.  Fax: [phone]

## 2011-03-12 NOTE — H&P (Signed)
NAME:  [name], [name] NO.:  [id number]   MEDICAL RECORD NO.:  [id number]          PATIENT TYPE:  INP   LOCATION:  NA                           FACILITY:  [facility]   PHYSICIAN:  Ollen Gross, M.D.    DATE OF BIRTH:  May 30, 1922   DATE OF ADMISSION:  DATE OF DISCHARGE:                              HISTORY & PHYSICAL   CHIEF COMPLAINT:  Left knee pain.   HISTORY OF PRESENT ILLNESS:  The patient is an 77 year old male who has  been seen by Dr. Lequita Halt and has previously undergone a right knee  surgery.  Unfortunately, the left knee continues to be problematic and  progressively getting worse, the right total knee is doing well.  He has  been seen preoperatively by Dr. Wylene Simmer and felt to be stable for  surgery.  Risks and benefits have been discussed and it is felt he would  benefit undergoing total knee replacement on the left.   ALLERGIES:  NO KNOWN DRUG ALLERGIES.   INTOLERANCES:  Codeine causes sickness.   CURRENT MEDICATIONS:  Chloride tabs, Plavix, simvastatin, lisinopril,  vitamin E, vitamin D, B12.   PAST MEDICAL HISTORY:  1. Seasonal allergies.  2. History of bronchitis.  3. Hiatal hernia.  4. Reflux disease.  5. History of stroke June 2009   PAST SURGICAL HISTORY:  1. Rotator cuff surgery November 1996.  2. Rectal fistula repair January 2008.  3. Right total knee replacement July 2008.   FAMILY HISTORY:  Sister with history of cancer.  Brother with rheumatoid  arthritis.  Mother with history of blood clot and father with a ruptured  abdominal vessel.   SOCIAL HISTORY:  Widowed, retired.  No alcohol.  Past smoker, quit 1948.  He does want to look into a skilled rehab facility.   REVIEW OF SYSTEMS:  GENERAL:  No fevers, chills or night sweats.  Neuro:  Seizures, syncope or paralysis.  Respiratory: No shortness breath,  productive cough or hemoptysis.  Cardiovascular:  No chest pain or  orthopnea.  GI: No nausea, vomiting, diarrhea or  constipation.  GU: No  dysuria, hematuria or discharge.  Musculoskeletal: Left knee.   PHYSICAL EXAMINATION:  VITAL SIGNS: Pulse 68, respirations 12, blood  pressure 152/62.  GENERAL: An 77 year old white male, small frame, alert and oriented,  cooperative, pleasant.  HEENT: Normocephalic, atraumatic.  Pupils are reactive.  EOMs intact.  NECK: Supple.  CHEST: Clear.  HEART: Regular rate and rhythm with occasional ectopic beat.  ABDOMEN: Soft, flat, nontender.  Bowel sounds present.  RECTAL/BREASTS/GENITALIA:  Not done, not pertinent to history of present  illness.  EXTREMITIES:  Left knee slight varus malalignment deformity.  Range of  motion 5-115.  No instability.   IMPRESSION:  Osteoarthritis left knee.   PLAN:  The patient is admitted to [facility] to undergo a left  total knee replacement arthroplasty.  Surgery will be performed by Dr.  Ollen Gross.  Dr. Wylene Simmer, his medical physician, will be notified of  the room number on admission and will be consulted if needed for medical  assistance with the patient throughout the  hospital course.  The patient  does want to look into a skilled nursing facility after his hospital  stay.      Alexzandrew L. Perkins, P.A.C.      Ollen Gross, M.D.  Electronically Signed    ALP/MEDQ  D:  04/07/2009  T:  04/08/2009  Job:  [id number]   cc:   Gaspar Garbe, M.D.  Fax: [phone]

## 2011-03-12 NOTE — Assessment & Plan Note (Signed)
[facility]                         GASTROENTEROLOGY OFFICE NOTE   JAJA, SWITALSKI                     MRN:          [id number]  DATE:07/23/2007                            DOB:          16-Jun-1922    PHYSICIAN REQUESTING CONSULT:  Gaspar Garbe, M.D.   REASON FOR CONSULTATION:  Hemoccult positive stool.   HISTORY OF PRESENT ILLNESS:  The patient is a pleasant 77 year old white  male referred through the courtesy of Dr. Wylene Simmer.  He was recently  diagnosed with B12 deficiency anemia.  He was found to have hemoccult  positive stool.  He has not noted any gastrointestinal symptoms and  specifically denies any change in bowel habits, hematochezia, melena,  change in stool caliber, diarrhea, constipation, nausea, vomiting,  abdominal pain, or rectal pain.  He states that he has lost about 10  pounds since his right total knee replacement at the end of June.  There  is no family history of colon cancer, colon polyps, or inflammatory  bowel disease.  He has not previously had colonoscopy.   PAST MEDICAL HISTORY:  Vitamin B12 deficiency anemia  arthritis status  post left rotator cuff surgery  status post right total knee replacement June 2008  status post rectal fistula repair January of 2008  status post hemorrhoidectomy in the remote past   CURRENT MEDICATIONS:  Listed on the chart, updated, and reviewed.   ALLERGIES:  CODEINE.   SOCIAL HISTORY:   REVIEW OF SYSTEMS:  Per the handwritten form.   PHYSICAL EXAMINATION:  GENERAL:  A well-developed, well-nourished,  elderly white male in no acute distress.  VITAL SIGNS:  Height 5 feet 8 inches, weight 147 pounds, blood pressure  130/82, pulse 64 and regular.  HEENT:  Anicteric sclerae, oropharynx clear.  CHEST:  Clear to auscultation bilaterally.  HEART:  Regular rate and rhythm without murmurs appreciated.  ABDOMEN:  Soft, nontender, and nondistended.  Normal active bowel  sounds.  No  palpable organomegaly, masses, or hernias.  RECTAL:  Deferred to time of colonoscopy.  EXTREMITIES:  Without cyanosis, clubbing, or edema.  NEUROLOGY:  Alert and oriented x3.  Grossly nonfocal.   ASSESSMENT:  Hemoccult positive stool and mild weight loss.  Recently  diagnosed B12 deficiency anemia.  Rule out colorectal neoplasms.  Risks,  benefits, and alternatives to colonoscopy with possible biopsy and  possible polypectomy discussed with the patient and he consents to  proceed.  This will be scheduled electively.     Venita Lick. Russella Dar, MD, [facility]  Electronically Signed    MTS/MedQ  DD: 07/23/2007  DT: 07/23/2007  Job #: [id number]   cc:   Gaspar Garbe, M.D.

## 2011-03-12 NOTE — Discharge Summary (Signed)
NAME:  NARADA, UZZLE NO.:  [id number]   MEDICAL RECORD NO.:  [id number]          PATIENT TYPE:  INP   LOCATION:  1619                         FACILITY:  [facility]   PHYSICIAN:  Ollen Gross, M.D.    DATE OF BIRTH:  November 27, 1921   DATE OF ADMISSION:  04/27/2007  DATE OF DISCHARGE:                               DISCHARGE SUMMARY   ADDENDUM   ADMITTING AND DISCHARGE DIAGNOSES:  As previously dictated.   ADDENDUM:  There was a possibility the patient could go last Friday,  although being a holiday was unable to arrange a bed.  He stayed through  the weekend.  He did have an episode of increased pain Thursday  night/Friday morning.  X-rays were done and x-rays were okay.  It was  felt that he over-worked himself in the CPM.  He had some increased  swelling.  He did develop a little bit of erythema and I think it was  more reaction.  He was started on Keflex by the covering physician over  the weekend.  He was seen back on this morning, May 05, 2007.  His  swelling had improved, the erythema had improved.  We think it was just  more of an inflammatory response with the CPM.  The CPM is discontinued  at the time of transfer.  He is feeling much better, afebrile.  He had a  normal white count on his blood work.  We have a bed that came up over  at [facility].  The patient is ready to go and we will transfer him  over at this time.   DISCHARGE PLAN:  Transferred over to [facility], May 04, 2007.   DISCHARGE DIAGNOSES:  Above.   DISCHARGE MEDICATIONS:  Please continue all the current medications as  previously dictated.   New medications:  1. Hydrocortisone 1% cream to back t.i.d. p.r.n.  2. Benadryl 25 mg p.o. b.i.d. to t.i.d. p.r.n. rash.  3. Keflex p.o. t.i.d. for 7 more days to complete course and finish by      May 11, 2007.  4. Tessalon 100 mg p.o. t.i.d. p.r.n. cough.   ACTIVITY AND FOLLOWUP:  As previously dictated.   CONDITION UPON DISCHARGE:   Improving.   DISPOSITION:  [facility].      Alexzandrew L. Perkins, P.A.C.      Ollen Gross, M.D.  Electronically Signed    ALP/MEDQ  D:  05/04/2007  T:  05/04/2007  Job:  [id number]   cc:   Lynne Leader, MD  [facility]   [facility] with the patient

## 2011-03-12 NOTE — Group Therapy Note (Signed)
NAME:  DAMARIO, GILLIE NO.:  [id number]   MEDICAL RECORD NO.:  [id number]          PATIENT TYPE:  INP   LOCATION:  1607                         FACILITY:  [facility]   PHYSICIAN:  Ollen Gross, M.D.    DATE OF BIRTH:  [birth year]                                 PROGRESS NOTE   DISCHARGE DIAGNOSIS:  Osteoarthritis, left knee.   OTHER DIAGNOSES:  1. History of cerebrovascular accident, June 2009.  2. History of bronchitis.  3. Hiatal hernia.  4. Reflux disease.   PROCEDURE:  Left total knee arthroplasty on April 10, 2009.   HOSPITAL COURSE:  Mr. Norwood is an 77 year old male admitted to [facility] via  the operating room on April 10, 2009, at which time he underwent a left  total knee arthroplasty.  Procedure was performed by Dr. Lequita Halt.  Tourniquet time was 35 minutes and there were no complications.  Dr.  Darrelyn Hillock assisted on the procedure.  Patient was transported to the  recovery room postoperatively in stable condition and then to the  orthopedic floor in stable condition.  He had intact neurovascular  function in his left lower extremity throughout the hospital course.  He  was afebrile with stable vital signs on postoperative day #1.  Hemoglobin was 9.9 on postop day 1.  His preop hemoglobin was 12.9.  His  INR was 1.3 on postop day 1 with a normal BMET.  His creatinine was 1.22  and was 1.5 preop.  Postop day 1 he was out of bed to chair.  He became  nauseated after taking Percocet and with his CODEINE ALLERGY we  subsequently switched him over to Dilaudid which he tolerated much  better.  As of postoperative day #2, he is sitting up in bed tolerating  a regular diet.  His PCA was discontinued and his IV is hep locked.  His  Foley catheter was discontinued on postop day 2.  Hemoglobin is 9.4.  He  remains hemodynamically stable.  His temperature was elevated to 100.9  and then he was afebrile after that.  His dressing is changed and  incision is  clean with no erythema or exudate.  Calf is soft and  nontender.  BMET remains within normal limits.  Creatinine is 1.4 which  is still lower than preop.  He was said to ambulate in the hallway with  physical therapy on postop day 2.  I anticipate that he will be stable  and ready for discharge on April 13, 2009, or April 14, 2009.   DISCHARGE MEDICATIONS:  Will include:  1. Dilaudid 2 mg 1 to 2 tablets p.o. every 4 to 6 hours as needed for      pain.  2. Robaxin 500 mg p.o. every 6 hours as needed for muscle spasm.  3. Pravastatin 10 mg q.p.m.  4. Lisinopril 10 mg 1/2 tablet at breakfast daily.  Please do not      restart his lisinopril until the morning of April 15, 2009.  He has      been off of the lisinopril while in the hospital and restart that  on April 15, 2009.  5. Coumadin as per pharmacy protocol to keep his INR between 2 and 3.      He will be on Coumadin with end date being April 30, 2009.  As of      postoperative day #2, his INR is 1.6.  His doses on the night of      surgery and postoperative day #1 were 3 mg on each day.  Once he is      off of the Coumadin, then on May 01, 2009, may restart his Plavix      75 mg p.o. daily.  He is to remain off the Plavix until he is off      the Coumadin.   ACTIVITY LEVEL:  Weightbearing as tolerated, left lower extremity.  Will not need CPM in  the skilled nursing facility.  He may shower at the time of discharge  with the incision uncovered.  Please put a dry dressing on after  showering.   FOLLOWUP:  With Dr. Lequita Halt on April 25, 2009, or April 27, 2009.  Please call [phone] to arrange the appointment.   Addendum will be dictated prior to discharge.      Ollen Gross, M.D.  Electronically Signed     FA/MEDQ  D:  04/12/2009  T:  04/12/2009  Job:  [id number]

## 2011-03-12 NOTE — Consult Note (Signed)
NAME:  [name], [name] NO.:  [id number]   MEDICAL RECORD NO.:  [id number]          PATIENT TYPE:  INP   LOCATION:  1406                         FACILITY:  [facility]   PHYSICIAN:  Levert Feinstein, MD          DATE OF BIRTH:  October 14, 1922   DATE OF CONSULTATION:  04/20/2008  DATE OF DISCHARGE:                                 CONSULTATION   CHIEF COMPLAINT:  Stroke.   HISTORY OF PRESENT ILLNESS:  Patient is an 77 year old very pleasant  right-handed Caucasian male who woke up yesterday morning on April 19, 2008 at 9:00, noticed that he has difficulty raising his right arm above  the shoulder.  He described it as lifeless, but he denies pain or  paresthesias, which has prompted his ER visit.  CT of the brain at that  time did not show any acute lesion, and the symptoms have resolved after  5-6 hours.  He returned to the ER today for prescheduled MRI of the  brain, which has demonstrated small left frontal motor cortex acute  stroke.  In addition, during his MRI scan, he has transient right upper  extremity paresthesias and proximal weakness,  lasting one hour.  He is  currently at his baseline.   REVIEW OF SYSTEMS:  He denied vision change, dysarthria, or gait  difficulty, chest pain, shortness of breath.   PAST MEDICAL HISTORY:  None.   PAST SURGICAL HISTORY:  Left rotator cuff surgery.  He did have a fall  accident a couple of years ago, which has resulting left shoulder  atrophy and right knee replacement.   FAMILY HISTORY:  Noncontributory.   SOCIAL HISTORY:  He lives at his own home by himself and independent on  daily activities.  Denies smoking, drinking.   MEDICATIONS:  1. Aspirin 325 mg every day.  2. Multivitamin.   ALLERGIES:  CODEINE:  Cough, nausea.   PHYSICAL EXAMINATION:  He is afebrile.  Blood pressure 160-175/68-74,  heart rate 67, respirations 18.  CARDIAC:  Regular rate and rhythm.  PULMONARY:  Clear to auscultation bilaterally.  NECK:  Supple.   No carotid bruits.  NEUROLOGIC:  He is alert and oriented.  No dysarthria.  No aphasia.  Cranial nerves II-XII are status post cataract surgery.  Pupils are  equal, round and reactive.  Fundi were sharp bilaterally.  Visual fields  were full on confrontational test.  Facial sensation, strength was  normal.  Shoulder shrug and head-turning were normal and symmetric.  Motor examination:  There was limitation on motor examination due to  bilateral shoulder pain and deformity.  He has atrophy of the left supra-  and infraspinatus muscles, status post previous fall accident, mild left  shoulder abduction weakness.  The right upper extremity, proximal distal  motor strength is normal.  So was the bilateral lower extremity,  essentially was normal to light touch, pinprick, deep tendon reflex,  hypoactive, symmetric plantars, responses were flexor.  Coordination:  Normal finger-to-nose, heel-to-shin.  Gait was deferred.   MRI of the brain without contrast has demonstrated a small left acute  frontal motor cortex stroke.  MRA was mild stenosis in basilar arteries.   MRI cervical has demonstrated multilevel advanced cervical degenerative  changes, but there was no cord signal abnormality.   LABORATORY EVALUATION:  Normal CMP, CBC.  INR was 1.   ASSESSMENT/PLAN:  An 77 year old presenting with acute small left  frontal stroke.  1. Complete stroke workup, including echocardiogram, ultrasound of      carotid artery.  2. He has been  on aspirin for an extended period of time, and we will      switch him to Plavix.  3. Complete stroke lab evaluation, including fasting lipid profile,      TSH, B12, homocysteine, A1C.  4. Goal blood pressure less than or equal to 130/80.  LDL less than      100.      Levert Feinstein, MD  Electronically Signed     YY/MEDQ  D:  04/20/2008  T:  04/20/2008  Job:  [id number]

## 2011-03-12 NOTE — Discharge Summary (Signed)
NAME:  [name], [name] NO.:  [id number]   MEDICAL RECORD NO.:  [id number]          PATIENT TYPE:  INP   LOCATION:  1619                         FACILITY:  [facility]   PHYSICIAN:  Ollen Gross, M.D.    DATE OF BIRTH:  02-05-1922   DATE OF ADMISSION:  04/27/2007  DATE OF DISCHARGE:                               DISCHARGE SUMMARY   TENTATIVE DATE OF DISCHARGE:  Today's date, April 30, 2007.   ADMISSION DIAGNOSES:  1. Osteoarthritis, right knee.  2. Seasonal allergies.  3. Past history of bronchitis.  4. Hiatal hernia.  5. Reflux disease.   DISCHARGE DIAGNOSES:  1. Osteoarthritis, right knee, status post right total knee      arthroplasty.  2. Mild postoperative blood loss anemia.  3. Mild postoperative hyponatremia.  4. Osteoarthritis, right knee.  5. Seasonal allergies.  6. Past history of bronchitis.  7. Hiatal hernia.  8. Reflux disease.   PROCEDURES:  April 27, 2007, right total knee.  Surgeon:  Dr. Lequita Halt.  Assistant:  Avel Peace, PA-C.  Anesthesia:  General.   CONSULTATIONS:  None.   BRIEF HISTORY:  Mr. Coppolino is an 77 year old male with severe end-stage  arthritis of the right knee with severe varus deformity, approximately  20 degrees, progressive worsening pain and dysfunction, and now presents  for a total knee arthroplasty.   LABORATORY DATA:  Preop CBC showed a hemoglobin of 13.1, hematocrit of  39.0, normal white count of 7.2.  Serial CBCs were followed.  Hemoglobin  dropped down to 10.0.  Last noted 9.1, where it stabilized for a couple  of days.  PT/PTT on admission 13.6 and 27, respectively, INR of 1.0.  Serial pro times followed.  Last noted INR 1.9.  Chemistry panel on  admission within normal limits.  Serial BMETs were followed.  Sodium did  drop down from 138 to 134.  Preop UA negative.  Follow-up UA on April 29, 2007, negative with the exception of 0-2 white cells, 0-2 red cells.  Blood group type O positive.  Urine culture pending at  the time of this  dictation.   EKG April 17, 2007:  Possible ectopic atrial rhythm, premature atrial  contractures, confirmed, unable to read signature.   X-RAYS:  November 04, 2006:  Tortuous aorta and COPD, no active disease.  Follow-up chest April 29, 2007:  Small bilateral pleural effusions,  minimal bibasilar atelectasis.   HOSPITAL COURSE:  The patient was admitted to [facility],  tolerated the procedure well, later transferred to the recovery room and  the orthopedic floor.  Started on PCA and p.o. analgesics for pain  control following surgery.  Did fairly well on the evening of surgery  and the morning of day #1, started getting up out of bed.  Due to the  patient's social status, it was felt he would require a skilled nursing  facility.  We got the social worker involved quickly to help arrange for  placement of this patient.  Reduced fluids.  He started getting up out  of bed with therapy.  By day #2 he was doing  very well.  Had a little  bit of elevated temperature so we checked a chest x-ray, which showed a  little congestion and bibasilar atelectasis, so we encouraged incentive  spirometer and antipyretics.  Hemoglobin was down at 9.1 by day #2.  We  placed him on iron.  Hemoglobin stabilized, it was 9.1 the following day  also.  He was weaned off his PCA over to p.o. medications.  From a  therapy standpoint on postop day #2, he was starting to get up out of  bed and did a little bit more therapy.  He walked about 25 feet that  morning, then 95 feet that afternoon.  His urinalysis looked good.  The  urine culture was pending.  On day #3 hemoglobin was stable.  He was  doing well with his therapy.  He was using the incentive spirometer.  His temperature was back down.  It was felt if a bed opened up, they  were working on a skilled nursing facility, that he would be able to go,  making arrangements with discharge planning and social services, and  would have a  possible discharge later that day.   DISCHARGE PLAN:  Possible discharge on April 30, 2007.   DISCHARGE DIAGNOSES:  Please see above.   DISCHARGE MEDICATIONS:  1. Current medications include Coumadin.  He is on Coumadin protocol.      Please titrate the Coumadin level for a target INR between 2.0 and      3.0.  He needs to be on Coumadin for 3 weeks from date of surgery      of April 27, 2007.  2. Colace 100 mg p.o. b.i.d.  3. Nu-Iron 150 mg p.o. b.i.d. x3 weeks.  4. Nexium 20 mg p.o. daily.  Do not substitute.  5. Ambien 5 mg p.o. q.h.s. p.r.n. sleep.  6. Percocet 5 mg one or two every 4-6 hours as needed for pain.  7. Tylenol 325 mg one or two every 4-6 hours as needed for mild pain,      temperature or headache.  8. Reglan 10 mg p.o. q.6h. a.c. and h.s. for 3 more days following      discharge.  9. Robaxin 500 mg p.o. q.6-8h. p.r.n. spasm.  10.Maalox, Mylanta p.o. p.r.n.   DIET:  Diet as tolerated.   ACTIVITY:  He can be weightbearing as tolerated to the right lower  extremity.  He needs total knee protocol, out of bed minimum b.i.d. for  ambulation, range of motion and strengthening exercises for total knee.  Daily dressing change.  He may start showering; however, do not submerge  the incision under water.   FOLLOW-UP:  He needs to follow up with Dr. Lequita Halt approximately 2 weeks  from discharge.  Please contact the office at [phone] to arrange  appointment time and transfer the patient.   DISPOSITION:  Pending, awaiting bed offers.   CONDITION UPON DISCHARGE:  Clinically improving at time of dictation.      Alexzandrew L. Perkins, P.A.C.      Ollen Gross, M.D.  Electronically Signed    ALP/MEDQ  D:  04/30/2007  T:  04/30/2007  Job:  [id number]   cc:   [facility]. [facility]

## 2011-03-12 NOTE — Consult Note (Signed)
NAME:  [name], [name] NO.:  [id number]   MEDICAL RECORD NO.:  [id number]          PATIENT TYPE:  INP   LOCATION:  1401                         [facility]   PHYSICIAN:  Cassell Clement, M.D. DATE OF BIRTH:  [birth year]   DATE OF CONSULTATION:  04/12/2009  DATE OF DISCHARGE:                                 CONSULTATION   I was asked to see this elderly gentleman by Dr. Darrelyn Hillock covering for  Dr. Lequita Halt in regard to sudden onset of rapid heart rate.  This is a  pleasant 77 year old widowed Caucasian gentleman who 2 days ago  underwent uneventful left total knee replacement by Dr. Lequita Halt.  He had  been doing well and has been participating in therapy and was  anticipating going to a skilled nursing rehab facility tomorrow.  Today,  he went into sudden tachycardia.  He denies any chest pain associated  with the tachycardia or any dyspnea.  Despite the rapid rate, he was in  no distress.  He gives a history that Dr. Smitty Cords used to be his doctor  and that he has had these episodes of paroxysmal tachycardia going back  20-25 years.  When they would occur, they would be so forceful that they  would shake the bed.  Normally, they would resolve within several hours  without specific treatment.  The patient was never placed on any  medication by Dr. Smitty Cords for these.  He was on no cardiac medications  until June 2009 when he was hospitalized with a stroke involving  weakness of the right arm.  At that time, he was found to have 40%  stenosis of his carotid artery on the right and was placed on  simvastatin, lisinopril and Plavix.  He has had no recurrent TIA or  stroke since then.  Plavix is temporarily on hold while he is on  Coumadin postoperative for his knee replacement.  As noted today, the  patient did spike a temperature of 102 coincident with the onset of his  tachycardia.  He has had a mild nonproductive cough.  He has had no  dysuria.  He has had no chest pain.   He is not having any evidence of  deep vein thrombosis or phlebitis.  He has had no dysuria.   The patient has no known drug allergies, but he does not tolerate  CODEINE.   MEDICATIONS ON ADMISSION:  Chloride tablets, Plavix, simvastatin,  lisinopril vitamin E, vitamin D, and vitamin B12.   PAST SURGICAL HISTORY:  Includes previous right total knee replacement  in July 2008.  He has also had a rectal fistula repair in January 2008  and a rotator cuff surgery in November 1996.   SOCIAL HISTORY:  Reveals that he has been a widow.  He does not use  alcohol, and he quit smoking in 1948.  He still works as a Editor, commissioning.   FAMILY HISTORY:  Positive for cancer, and mother had a history of blood  clots.  Father had a history of a ruptured abdominal vessel.   REVIEW OF SYSTEMS:  Otherwise unremarkable.  He  denies any change in  gastrointestinal function.  He has had no nausea or vomiting.  He denies  any dysuria.  He has had no pleurisy.  All other systems negative in  detail.   PHYSICAL EXAMINATION:  On physical examination this evening, his blood  pressure is 100/60.  The pulse is 160 and regular.  Temperature is 102.  Respirations are unlabored.  This is an elderly, alert gentleman, smiling and in no distress.  The  skin is flushed.  His skin is warm to the touch.  HEAD AND NECK EXAM:  Revealed the pupils were equal.  Sclerae  nonicteric.  Mouth and pharynx normal.  Carotids reveal no audible bruits.  The jugular venous pressure is  normal.  Thyroid is normal.  The chest is clear to percussion and auscultation.  The heart reveals a regular tachycardia.  There is no murmur, gallop,  rub or click.  There is no abnormal lift or heave.  The abdomen is soft and nontender.  Liver not enlarged.  The extremities show no phlebitis or edema.   His electrocardiogram shows a regular narrow complex supraventricular  tachycardia, probably atrial flutter, although at this rate it  is  difficult to see the flutter waves clearly.  There are no acute ST-  segment changes to suggest ischemia.   RECOMMENDATIONS:  Will going to use IV diltiazem for rate control.  Because of his blood pressure of 100/60, we will increase his IV fluid  rate.  Would encourage p.o. fluids in the face of his fever.  We will  obtain blood cultures, urine culture and a CBC.  Will continue Coumadin  therapy.  Will recheck his cardiac enzymes and recheck his EKG in the  morning.  Many thanks for the opportunity to see this pleasant gentleman  with you.   Will follow with you during the hospital stay.           ______________________________  Cassell Clement, M.D.     TB/MEDQ  D:  04/12/2009  T:  04/13/2009  Job:  [id number]   cc:   Ollen Gross, M.D.  Fax: [phone]   Gaspar Garbe, M.D.  Fax: [phone]

## 2011-03-12 NOTE — Op Note (Signed)
NAME:  [name], [name] NO.:  [id number]   MEDICAL RECORD NO.:  [id number]          PATIENT TYPE:  INP   LOCATION:  1619                         [facility]ACILITY:  [facility]   PHYSICIAN:  Ollen Gross, M.D.    DATE OF BIRTH:  11/23/1921   DATE OF PROCEDURE:  04/27/2007  DATE OF DISCHARGE:                               OPERATIVE REPORT   PREOPERATIVE DIAGNOSIS:  Osteoarthritis right knee.   POSTOPERATIVE DIAGNOSIS:  Osteoarthritis right knee.   PROCEDURE:  Right total knee arthroplasty.   SURGEON:  Dr. Lequita Halt   ASSISTANT:  Avel Peace PA-C   ANESTHESIA:  General with postop Marcaine pain pump.   ESTIMATED BLOOD LOSS:  Minimal.   DRAIN:  None.   TOURNIQUET TIME:  57 minutes at 300 mmHg.   COMPLICATIONS:  None.   CONDITION:  Stable to recovery.   CLINICAL NOTE:  Mr. Bostwick is an 77 year old male who has severe end-  stage arthritis of the right knee with severe varus deformity  approximately 20 degrees.  He had progressively worsening dysfunction.  Despite physical therapy still having a very difficult time with  ambulation and activities of daily living.  Presents now for total knee  arthroplasty.   PROCEDURE IN DETAIL:  After successful administration of general  anesthetic, tourniquet placed on the right thigh and right lower  extremity prepped and draped in the usual sterile fashion.  Extremities  wrapped in Esmarch, knee flexed, tourniquet inflated 300 mmHg.  Midline  incision made with a 10 blade through subcutaneous tissue to the level  of the extensor mechanism.  Fresh blade is used make a medial  parapatellar arthrotomy.  Soft tissue over the proximal medial tibia is  subperiosteally elevated the joint line with the knife into the  semimembranosus bursa with a Cobb elevator.  Soft tissue laterally is  elevated attention being paid to avoid patellar tendon on tibial  tubercle.  Patella subluxed laterally, knee flexed 90 degrees and PCL  removed.  The  ACL was already gone.  Drill was used create a starting  hole in the distal femur canal was thoroughly irrigated.  5 degrees  right valgus alignment guide is placed and referencing off the posterior  condyles rotations marked and the block pinned to remove 12 mm of distal  femur.  Additional resection due to the fact that he had significant  flexion contracture.  Distal femoral resection is made an oscillating  saw.  Sizing blocks placed, size 4 is most appropriate.  Rotations  marked off the epicondylar axis.  Size 4 cutting blocks placed in the  anterior-posterior chamfer cuts are made.   Tibia subluxed forward and menisci removed.  Significant deficiency  medial proximal tibia.  All the osteophytes are removed.  The  extramedullary tibial alignment guides placed referencing proximally at  medial aspect of the tibial tubercle and distally along the second  metatarsal axis tibial crest.  Blocks pinned to remove about 8 mm from  the lateral side which is slightly deficient.  Tibial resection is made  with an oscillating saw.  This did not get to the base  of the medial  defect which was about 5 mm below.  I used a step resection guide and  cut for a 5 mm wedge medially.  We then prepared the proximal tibia with  the modular drill proximal drill for the MB2 revision tray.  Placed a  trial size 4 tray with a 5-mm medial buildup.  This had excellent fit on  the cut bone surface.  We then prepared the proximal tibia with the keel  punch.  The femoral preparation is completed with the intercondylar cut  to size 4.   The tibial trial size 4 MB2 revision tray with a 5 mm medial augment as  well as the size 4 posterior stabilized femur place with 10 mm posterior  stabilized rotating platform insert.  This allowed for full extension  with excellent varus valgus balance throughout full range of motion.  This is a huge difference with excellent alignment now compared to  preop.  Patella everted,  thickness measured 25 mm.  Freehand resection  taken to 15 mm, 38 template placed, lug holes were drilled, trial  patella was placed and it tracks normally.  Osteophytes removed off the  posterior femur with the trial in place.  All trials were removed and  the cut bone surfaces were prepared with pulsatile lavage.  Cement was  mixed and once ready for implantation the size 4 MB2 revision tray with  the 5 mm medial augment is cemented into place and impacted.  Cement is  taken down to a size 3 restricter which had placed.  The size 4  posterior stabilized femur and 38 patella were also cemented in place.  Patella was held the clamp.  Trial 10-mm inserts placed and knee held in  full extension. All extruded cement removed.  Once cement fully hardened  then the permanent 10 mm posterior stabilized rotating platform insert  is placed into the tibial tray.  Wounds copiously irrigated saline  solution and then the FloSeal injected through the posterior capsule and  the mediolateral gutters, suprapatellar area.  Tourniquet was released  with total time of 57 minutes.  Moist sponge is held the wound and minor  bleeding stopped with cautery.  Wound was again irrigated and the  extensor mechanism closed with interrupted #1 PDS.  Flexion against  gravity to 140 degrees.  Subcu closed interrupted 2-0 Vicryl,  subcuticular running 4-0 Monocryl.  Catheter for Marcaine pain pump was  placed and the pump initiated.  Steri-Strips and bulky sterile dressing  are applied and he is awakened, transferred to recovery in stable  condition.      Ollen Gross, M.D.  Electronically Signed     FA/MEDQ  D:  04/27/2007  T:  04/28/2007  Job:  [id number]

## 2011-03-12 NOTE — H&P (Signed)
NAME:  [name], [name] NO.:  [id number]   MEDICAL RECORD NO.:  [id number]          PATIENT TYPE:  INP   LOCATION:  1406                         FACILITY:  [facility]   PHYSICIAN:  Kari Baars, M.D.  DATE OF BIRTH:  05-31-1922   DATE OF ADMISSION:  04/20/2008  DATE OF DISCHARGE:                              HISTORY & PHYSICAL   CHIEF COMPLAINT:  Right arm weakness, resolved.   PRESENT ILLNESS:  [name] is an 77 year old white male, previously  healthy gentleman with osteoarthritis and left rotator cuff who  presented to the emergency department yesterday with an acute onset of  right arm weakness.  He states that he was in his usual state of health  until yesterday when he awoke with inability to move his right arm.  He  states it felt lifeless, and he had difficulty grasping with his hand.  This was a significant change from his typical decreased range of motion  in his shoulders due to osteoarthritis and left rotator cuff disease.  He presented to the emergency department where a CT scan was performed  and showed no acute intracranial abnormalities.  His symptoms did  improve while he was in the emergency room and were completely resolved  by the time he returned home.  They stated they felt that the symptoms  were most consistent with a pinched nerve, and he was scheduled for an  MRI of the brain and neck.  The MRI of the brain was performed this  morning.  This showed an acute small left frontal lobe motor cortex  infarct consistent with his symptoms.  He was sent back to the emergency  department from radiology, and we were called to admit the patient.  The  patient states that after leaving radiology he did have a little bit of  tingling in his right hand but has not had any additional symptoms since  that time.  He denies any prior history of TIA symptoms, numbness or  weakness.  He is on an aspirin 2 daily for the past 60 years which he  takes for  arthritis symptoms regularly.  His only visit with [name]  in August 2008 revealed an LDL of 96 and a blood pressure of 124/68, and  so he has not been treated with statin or antihypertensive therapy in  the past.  Upon presentation to the emergency department, his blood  pressure was 175/74 and is currently 160/66.   PAST MEDICAL HISTORY:  1. Osteoarthritis status post right total knee replacement.  2. Left rotator cuff repair.  3. B12 deficiency.  4. Erectile dysfunction.   MEDICATIONS:  1. Aspirin 325 mg 2 daily.  2. Viagra p.r.n.   ALLERGIES:  Is no known drug allergies.   SOCIAL HISTORY:  He is married with 2 children, 1 grandchild, 1 great-  grandchild.  He smoked 2 packs per day for 4 years but quit 60 years  ago.  Rare alcohol use in the past, none recently.  He is retired from  his job as a Archivist.   FAMILY HISTORY:  Negative for  coronary artery disease.  His mother did  have a brain aneurysm which he attributes to complications from blood  thinners following a dog bite.  She died at age of 1.  His father had  an abdominal aortic aneurysm.  He had one brother who died from  complications from rheumatoid arthritis.   PHYSICAL EXAMINATION:  Temperature 97.5, blood pressure 175/74 initially  - 160/66 on recheck, pulse 68, respirations 18, oxygen saturation 96% on  room air.  GENERAL:  Pleasant gentleman no acute stress.  HEENT:  Pupils equal, round, and reactive to light.  Extraocular  movements intact.  Oropharynx moist without erythema.  NECK:  Supple without lymphadenopathy, JVD or carotid bruits.  HEART:  Regular rate and rhythm without murmurs, rubs or gallops.  LUNGS:  Clear to auscultation bilaterally.  ABDOMEN:  Soft, nondistended, nontender with normoactive bowel sounds.  EXTREMITIES:  No clubbing, cyanosis or edema.  NEUROLOGIC:  Alert and oriented x4.  Motor strength is 5/5 in all  extremities with no focal weakness.  Cranial nerves II-XII  intact.  Sensation grossly intact.  EXTREMITIES:  No clubbing, cyanosis or edema.  No clonus.   LABORATORY DATA:  CBC shows a white count of 9.9, hemoglobin 14.5,  platelets 284.  BUN significant for sodium 141, potassium 4, chloride  107, bicarb 26, BUN 25, creatinine 1.2, glucose 92.   STUDIES:  1. EKG shows normal sinus rhythm with sinus arrhythmia.  No ST      changes.  2. MRI/MRA of the head showed small acute infarct in the left frontal      lobe motor cortex at the area of right upper extremity      representation.  No associated mass effect or hemorrhage.  Moderate      underlying chronic small vessel disease.  No proximal intracranial      occlusions.  Visualized left MCA branches are within normal limits.      Mild stenosis in the mid basilar artery.  3. MRI of the cervical spine without contrast:  Diffusely advanced      cervical degenerative changes with multilevel multifactorial spinal      stenosis, worse at the C4-C5 and C5-C6 levels.  No spinal cord      abnormalities seen.  Multifactorial multilevel bilateral neural      foraminal stenosis, worst at C3-C4.   ASSESSMENT/PLAN:  1. Acute left frontal stroke - his right upper extremity hemiparesis      is most consistent with acute stroke with symptoms resolved in less      than 12 hours with no recurrence.  This is likely due to      microvascular disease in the setting of his elevated blood pressure      and prior smoking history.  His other risk factors have been well      managed and were within goal range in August 2008 at that time his      last visit.  We will admit for secondary workup to include a      carotid ultrasound, echocardiogram, and telemetry monitoring.  Will      obtain fasting lipid profile, homocysteine, TSH, A1c and treat to a      goal LDL of less 70.  Will likely need a statin to achieve this.      Will use antiplatelet therapy with Plavix 75 mg daily due to prior      aspirin use.  2.  Elevated blood pressure - previously controlled blood  pressure with      current goal less than 130/80.  Will avoid hypotension in the      setting of his acute stroke.  Will start low-dose ACE inhibitor      with lisinopril 5 mg daily.  3. Osteoarthritis/spinal stenosis - Tylenol p.r.n.  4. Disposition - anticipate discharge in 24 hours.      Kari Baars, M.D.  Electronically Signed    WS/MEDQ  D:  04/20/2008  T:  04/20/2008  Job:  [id number]   cc:   Gaspar Garbe, M.D.  Fax: [phone]

## 2011-03-12 NOTE — H&P (Signed)
NAME:  [name] NO.:  [id number]   MEDICAL RECORD NO.:  [id number]          PATIENT TYPE:  INP   LOCATION:  NA                           FACILITY:  [facility]   PHYSICIAN:  Ollen Gross, M.D.    DATE OF BIRTH:  03/23/1922   DATE OF ADMISSION:  04/27/2007  DATE OF DISCHARGE:                              HISTORY & PHYSICAL   CHIEF COMPLAINT:  Right knee pain.   HISTORY OF PRESENT ILLNESS:  The patient is an 77 year old male who has  been seen by Dr. Lequita Halt for ongoing pain and dysfunction for quite some  time now.  He is known to have end-stage arthritis and has complained of  some instability and dysfunction.  He has tried physical therapy which  has given him a little bit of strength, but he still has a tremendous  amount of pain.  It is felt he would benefit from undergoing knee  replacement.  Risks and benefits have been discussed and elects to  proceed with surgery.   ALLERGIES:  No known drug allergies.  Intolerances to CODEINE causing  sickness.   CURRENT MEDICATIONS:  Glucosamine, vitamin C, vitamin E, Centrum Silver  multivitamin, antihistamine tabs and aspirin.   PAST MEDICAL HISTORY:  1. Seasonal allergies.  2. History of bronchitis.  3. Hiatal hernia.  4. Reflux disease.   PAST SURGICAL HISTORY:  1. Rotator cuff repair in November 1996.  2. Rectal fistula repair in January 2008.   SOCIAL HISTORY:  Widowed.  Retired.  A two pack smoker, quit in 1948.  No alcohol.  Two children.  Lives alone in a split-level and has no one  to assist him with care after surgery and wants to look into a rehab or  skilled nursing facility.   FAMILY HISTORY:  Sister with history of cancer.  Brother with history of  rheumatoid arthritis.   REVIEW OF SYSTEMS:  GENERAL:  No fevers, chills, night sweats.  NEUROLOGIC:  No seizures, syncope or paralysis.  RESPIRATORY:  No  shortness of breath, productive cough or hemoptysis, although he does  have some seasonal  allergies.  CARDIOVASCULAR:  No chest pain, angina or  orthopnea.  GI: No nausea, vomiting, diarrhea or constipation.  GU:  No  dysuria, hematuria or urgency.  MUSCULOSKELETAL:  Right knee.   PHYSICAL EXAMINATION:  VITAL SIGNS:  Pulse 76, respirations 12, blood  pressure 122/58.  GENERAL:  A 77 year old, white male, well-nourished, well-developed, in  no acute distress.  He is alert, oriented and cooperative, very pleasant  at time of exam.  HEENT:  Normocephalic, atraumatic.  Pupils are round and reactive.  Oropharynx clear.  EOMs intact.  NECK:  Supple.  CHEST:  Clear.  HEART:  Irregular rhythm with ectopic beats.  S1, S2 noted, but he does  have an early systolic ejection murmur best heard over aortic point.  ABDOMEN:  Soft, nontender.  Bowel sounds present.  BREASTS/RECTAL/GENITALIA:  Not done, not pertinent to present illness.  EXTREMITIES:  Left knee, opposite knee, shows range of motion 0-120 and  a slight varus malalignment deformity.  Right knee range  of motion 5-115  marked varus malalignment deformity.  Tender more medial than lateral.   IMPRESSION:  1. Osteoarthritis right knee.  2. Seasonal allergies.  3. Past history of bronchitis.  4. Hiatal hernia.  5. Reflux disease.   PLAN:  The patient admitted to [facility] to undergo a right  total knee replacement arthroplasty.  Surgery will be performed by Dr.  Ollen Gross.      Alexzandrew L. Perkins, P.A.C.      Ollen Gross, M.D.  Electronically Signed    ALP/MEDQ  D:  04/26/2007  T:  04/27/2007  Job:  [id number]   cc:   Lynne Leader, M.D.  [facility]

## 2011-03-12 NOTE — Op Note (Signed)
NAME:  JAMAR, WEATHERALL NO.:  [id number]   MEDICAL RECORD NO.:  [id number]          PATIENT TYPE:  INP   LOCATION:  0002                         FACILITY:  [facility]   PHYSICIAN:  Ollen Gross, M.D.    DATE OF BIRTH:  07-26-1922   DATE OF PROCEDURE:  04/10/2009  DATE OF DISCHARGE:                               OPERATIVE REPORT   PREOPERATIVE DIAGNOSIS:  Osteoarthritis left knee.   POSTOPERATIVE DIAGNOSIS:  Osteoarthritis left knee.   SURGEON:  Ollen Gross, M.D.   ASSISTANT:  Georges Lynch. Darrelyn Hillock, M.D.   ANESTHESIA:  General with postop Marcaine pain pump.   ESTIMATED BLOOD LOSS:  Minimal.   DRAINS:  None.   TOURNIQUET TIME:  35 minutes at 300 mmHg.   COMPLICATIONS:  None.   CONDITION:  Stable to recovery.   CLINICAL NOTE:  Mr. Mclear is an 77 year old male with severe end-stage  arthritis of the left knee with progressively worsening pain and  dysfunction.  He has had a previous successful right total knee  arthroplasty and presents now for left total knee arthroplasty.   PROCEDURE IN DETAIL:  After successful administration of general  anesthetic, a tourniquet was placed on his left thigh and left lower  extremity is prepped and draped in the usual sterile fashion.  Extremities wrapped in Esmarch, knee flexed and tourniquet inflated to  300 mmHg.  The incision was made with a 10 blade through subcutaneous  tissue to the level of the extensor mechanism.  A fresh blade is used to  make a medial parapatellar arthrotomy.  Soft tissue on the proximal  medial tibia is subperiosteally elevated to the joint line with the  knife and into the semimembranosus bursa with a Cobb elevator.  Soft  tissue laterally is elevated with attention being paid to avoiding the  patellar tendon on tibial tubercle.  The patella is subluxed laterally,  knee flexed 90 degrees and ACL and PCL removed.  Drill was used create a  starting hole in the distal femur and the canal was  thoroughly  irrigated.  The 5 degrees left valgus alignment guide is placed and  referencing off the posterior condyle, rotations marked and the block  pinned to remove 10 mm of the distal femur.  Distal femoral resection is  made with an oscillating saw.  Sizing block is placed and size 4 is most  appropriate.  Rotations marked at the epicondylar axis.  Size 4 cutting  block is placed and the anterior, posterior and chamfer cuts are made.   The tibia is subluxed forward and the menisci are removed.  The  extramedullary tibial alignment guide is placed referencing proximally  at the medial aspect of the tibial tubercle and distally along the  second metatarsal axis and tibial crest.  A block is pinned to remove 10  mm off the non deficient lateral side.  This did not get the bottom of  the medial defect.  It took an additional 2 mm to get underneath the  medial defect.  Size 4 is the most appropriate tibial component and the  proximal tibia is  prepared the modular drill and keel punch for the size  4.  Femoral preparation is completed with the intercondylar cut.   Size 4 mobile bearing, tibial trial size 4 posterior stabilized femoral  trial and a 12.5-mm posterior stabilized rotating platform insert trial  are placed.  With the 12.5 full extension is achieved with excellent  varus-valgus and anterior-posterior balance throughout full range of  motion.  The patella is then everted and thickness measured to be 24 mm.  Freehand resection taken to 14 mm, 38 template is placed, lug holes were  drilled, trial patella was placed and it tracks normally.  Osteophytes  removed off the posterior femur with the trial in place.  All trials  were removed and the cut bone surfaces are prepared with pulsatile  lavage.  Cement was mixed and once ready for implantation, the size 4  mobile bearing tibial tray, size 4 posterior stabilized femur and 38  patella are cemented into place.  The patella was held  with a clamp.  Trial 12.5 insert is placed, knee held in full extension and all  extruded cement removed.  The cement fully hardened, the permanent 12.5  mm posterior stabilized rotating platform insert was placed into the  tibial tray.  The wound was copiously irrigated with saline solution and  then the FloSeal injected on the posterior capsule, mediolateral gutters  and suprapatellar area.  Moist sponge is placed and tourniquet released  for a total time of 35 minutes.  Sponge is held for 2 minutes then  removed.  Minimal bleeding was encountered.  Bleeding that is  encountered is stopped with electrocautery.  Wounds again irrigated to  remove the FloSeal then the arthrotomy was closed with interrupted #1  PDS.  Flexion against gravity is 140 degrees with the patella tracking  normally.  Subcu was then closed with interrupted 2-0 Vicryl and  subcuticular running 4-0 Monocryl.  Catheter for Marcaine pain pump is  placed and the pump initiated.  Steri-Strips and bulky sterile dressing  are applied and he is then placed into a knee immobilizer, awakened and  taken to recovery in stable condition.      Ollen Gross, M.D.  Electronically Signed     FA/MEDQ  D:  04/10/2009  T:  04/10/2009  Job:  [id number]

## 2011-03-15 NOTE — Op Note (Signed)
NAME:  [name], [name] NO.:  [id number]   MEDICAL RECORD NO.:  [id number]          PATIENT TYPE:  AMB   LOCATION:  DAY                          FACILITY:  [facility]   PHYSICIAN:  Sharlet Salina T. 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   SURGICAL PROCEDURE:  Anal fistulotomy.   SURGEON:  Lorne Skeens. Hoxworth, M.D.   ANESTHESIA:  General.   BRIEF HISTORY:  Mr. Jaworski is an 77 year old male who several months  ago developed boil at his anus with spontaneous drainage.  He has  continued to have intermittent drainage and discomfort in this area; and  on examination in the office he has an obvious fistula in the right  anterolateral anus, which on palpation, appears to track superficially  up to the anus.  Anal fistulotomy under general anesthesia has been  recommend and accepted.  The nature of the procedure, its indications,  risks of bleeding, infection, recurrence, and degrees of incontinence  have been discussed and understood.  He is now brought to the operating  room for this procedure.   DESCRIPTION OF OPERATION:  Following a rectal prep at home, the patient  is brought to the operating room, and placed in the supine position on  the operating table, and general laryngeal mask anesthesia was induced.  He is carefully positioned in the lithotomy position; and the perineum  sterilely prepped and draped.  Correct patient and procedure were  verified.  The anus was carefully inspected which showed some mild  hemorrhoids.  I did not see an obvious internal opening.  A probe was  passed into the external opening which passed easily with no pressure  whatsoever medially and anteriorly to the dentate line near the anterior  midline where there was a small area of what appeared to be possibly  some granulation  tissue over the anoderm.  This did not pass freely  through this area, but there were no other abnormalities in the area;  and it was thin and the probe passed through this with minimal pressure.  The tissue overlying the probe was then sharply divided down to the  subcu and further division was carried down with cautery.  The fistula  appeared to traverse a minimal portion of the external sphincter; and  the fistulotomy was performed completely.  Hemostasis was obtained with  cautery.  The soft tissue was infiltrated with Marcaine.  Moist saline  gauze dressing was applied; and the patient was taken to recovery in  good condition.      Lorne Skeens. Hoxworth, M.D.  Electronically Signed     BTH/MEDQ  D:  11/04/2006  T:  11/04/2006  Job:  [id number]

## 2011-07-25 LAB — CBC
HCT: 36.6 — ABNORMAL LOW
HCT: 39.6
HCT: 42.3
Hemoglobin: 12.5 — ABNORMAL LOW
Hemoglobin: 13.5
Hemoglobin: 14.5
MCHC: 34.1
MCHC: 34.2
MCHC: 34.2
MCV: 93.9
MCV: 94.3
MCV: 94.7
Platelets: 239
Platelets: 264
Platelets: 284
RBC: 3.86 — ABNORMAL LOW
RBC: 4.21 — ABNORMAL LOW
RBC: 4.49
RDW: 13.1
RDW: 13.3
RDW: 13.5
WBC: 7
WBC: 9.6
WBC: 9.9

## 2011-07-25 LAB — COMPREHENSIVE METABOLIC PANEL
ALT: 16
ALT: 17
AST: 27
AST: 30
Albumin: 3.6
Albumin: 3.9
Alkaline Phosphatase: 68
Alkaline Phosphatase: 81
BUN: 25 — ABNORMAL HIGH
BUN: 28 — ABNORMAL HIGH
CO2: 25
CO2: 26
Calcium: 9.2
Calcium: 9.5
Chloride: 107
Chloride: 107
Creatinine, Ser: 1.2
Creatinine, Ser: 1.22
GFR calc Af Amer: >60
GFR calc Af Amer: >60
GFR calc non Af Amer: 57 — ABNORMAL LOW
GFR calc non Af Amer: 58 — ABNORMAL LOW
Glucose, Bld: 92
Glucose, Bld: 96
Potassium: 3.8
Potassium: 4
Sodium: 141
Sodium: 141
Total Bilirubin: 1
Total Bilirubin: 1
Total Protein: 6.9
Total Protein: 7.4

## 2011-07-25 LAB — DIFFERENTIAL
Basophils Absolute: 0
Basophils Absolute: 0
Basophils Relative: 0
Basophils Relative: 0
Eosinophils Absolute: 0
Eosinophils Absolute: 0
Eosinophils Relative: 0
Eosinophils Relative: 0
Lymphocytes Relative: 10 — ABNORMAL LOW
Lymphocytes Relative: 11 — ABNORMAL LOW
Lymphs Abs: 0.9
Lymphs Abs: 1.1
Monocytes Absolute: 0.5
Monocytes Absolute: 0.6
Monocytes Relative: 5
Monocytes Relative: 6
Neutro Abs: 8 — ABNORMAL HIGH
Neutro Abs: 8.3 — ABNORMAL HIGH
Neutrophils Relative %: 84 — ABNORMAL HIGH
Neutrophils Relative %: 84 — ABNORMAL HIGH

## 2011-07-25 LAB — TSH: TSH: 4.999

## 2011-07-25 LAB — BASIC METABOLIC PANEL
BUN: 25 — ABNORMAL HIGH
CO2: 24
Calcium: 8.6
Chloride: 108
Creatinine, Ser: 1.12
GFR calc Af Amer: >60
GFR calc non Af Amer: >60
Glucose, Bld: 90
Potassium: 3.7
Sodium: 138

## 2011-07-25 LAB — PROTIME-INR
INR: 1
Prothrombin Time: 13

## 2011-07-25 LAB — POCT I-STAT, CHEM 8
BUN: 27 — ABNORMAL HIGH
Calcium, Ion: 1.18
Chloride: 108
Creatinine, Ser: 1.3
Glucose, Bld: 99
HCT: 44
Hemoglobin: 15
Potassium: 4.1
Sodium: 141
TCO2: 23

## 2011-07-25 LAB — HEMOGLOBIN A1C
Hgb A1c MFr Bld: 5.5
Mean Plasma Glucose: 119

## 2011-07-25 LAB — LIPID PANEL
Cholesterol: 162
HDL: 37 — ABNORMAL LOW
LDL Cholesterol: 109 — ABNORMAL HIGH
Total CHOL/HDL Ratio: 4.4
Triglycerides: 80
VLDL: 16

## 2011-07-25 LAB — HOMOCYSTEINE: Homocysteine: 21.5 — ABNORMAL HIGH

## 2011-07-25 LAB — C-REACTIVE PROTEIN: CRP: 0.1 — ABNORMAL LOW (ref <0.6)

## 2011-07-25 LAB — VITAMIN B12: Vitamin B-12: 104 — ABNORMAL LOW (ref 211–911)

## 2011-07-25 LAB — SAMPLE TO BLOOD BANK

## 2011-07-25 LAB — SEDIMENTATION RATE: Sed Rate: 18 — ABNORMAL HIGH

## 2011-07-25 LAB — APTT: aPTT: 27

## 2011-08-13 LAB — URINE MICROSCOPIC-ADD ON

## 2011-08-13 LAB — CBC
HCT: 25.4 — ABNORMAL LOW
HCT: 25.4 — ABNORMAL LOW
HCT: 26.2 — ABNORMAL LOW
HCT: 29 — ABNORMAL LOW
HCT: 29.2 — ABNORMAL LOW
Hemoglobin: 10 — ABNORMAL LOW
Hemoglobin: 10.1 — ABNORMAL LOW
Hemoglobin: 9 — ABNORMAL LOW
Hemoglobin: 9.1 — ABNORMAL LOW
Hemoglobin: 9.1 — ABNORMAL LOW
MCHC: 34.6
MCHC: 34.7
MCHC: 34.7
MCHC: 35.5
MCHC: 35.6
MCV: 101.4 — ABNORMAL HIGH
MCV: 102.3 — ABNORMAL HIGH
MCV: 102.5 — ABNORMAL HIGH
MCV: 103 — ABNORMAL HIGH
MCV: 103.2 — ABNORMAL HIGH
Platelets: 198
Platelets: 206
Platelets: 221
Platelets: 223
Platelets: 274
RBC: 2.48 — ABNORMAL LOW
RBC: 2.5 — ABNORMAL LOW
RBC: 2.54 — ABNORMAL LOW
RBC: 2.81 — ABNORMAL LOW
RBC: 2.85 — ABNORMAL LOW
RDW: 14.8 — ABNORMAL HIGH
RDW: 15.2 — ABNORMAL HIGH
RDW: 15.3 — ABNORMAL HIGH
RDW: 15.5 — ABNORMAL HIGH
RDW: 15.6 — ABNORMAL HIGH
WBC: 6.6
WBC: 7.4
WBC: 7.7
WBC: 8.7
WBC: 9.9

## 2011-08-13 LAB — URINE CULTURE
Colony Count: NO GROWTH
Culture: NO GROWTH
Special Requests: NEGATIVE

## 2011-08-13 LAB — BASIC METABOLIC PANEL
BUN: 15
BUN: 15
BUN: 15
BUN: 16
CO2: 24
CO2: 25
CO2: 27
CO2: 27
Calcium: 7.8 — ABNORMAL LOW
Calcium: 7.8 — ABNORMAL LOW
Calcium: 7.9 — ABNORMAL LOW
Calcium: 8.1 — ABNORMAL LOW
Chloride: 102
Chloride: 104
Chloride: 105
Chloride: 106
Creatinine, Ser: 1.03
Creatinine, Ser: 1.14
Creatinine, Ser: 1.18
Creatinine, Ser: 1.2
GFR calc Af Amer: >60
GFR calc Af Amer: >60
GFR calc Af Amer: >60
GFR calc Af Amer: >60
GFR calc non Af Amer: 58 — ABNORMAL LOW
GFR calc non Af Amer: 59 — ABNORMAL LOW
GFR calc non Af Amer: >60
GFR calc non Af Amer: >60
Glucose, Bld: 105 — ABNORMAL HIGH
Glucose, Bld: 110 — ABNORMAL HIGH
Glucose, Bld: 113 — ABNORMAL HIGH
Glucose, Bld: 120 — ABNORMAL HIGH
Potassium: 3.5
Potassium: 3.7
Potassium: 4
Potassium: 4.1
Sodium: 134 — ABNORMAL LOW
Sodium: 134 — ABNORMAL LOW
Sodium: 136
Sodium: 138

## 2011-08-13 LAB — PROTIME-INR
INR: 1.2
INR: 1.7 — ABNORMAL HIGH
INR: 1.9 — ABNORMAL HIGH
INR: 1.9 — ABNORMAL HIGH
INR: 2 — ABNORMAL HIGH
INR: 2.2 — ABNORMAL HIGH
INR: 2.5 — ABNORMAL HIGH
Prothrombin Time: 15.9 — ABNORMAL HIGH
Prothrombin Time: 20.4 — ABNORMAL HIGH
Prothrombin Time: 22.9 — ABNORMAL HIGH
Prothrombin Time: 22.9 — ABNORMAL HIGH
Prothrombin Time: 23.2 — ABNORMAL HIGH
Prothrombin Time: 25.8 — ABNORMAL HIGH
Prothrombin Time: 28.6 — ABNORMAL HIGH

## 2011-08-13 LAB — URINALYSIS, ROUTINE W REFLEX MICROSCOPIC
Bilirubin Urine: NEGATIVE
Glucose, UA: NEGATIVE
Ketones, ur: NEGATIVE
Leukocytes, UA: NEGATIVE
Nitrite: NEGATIVE
Protein, ur: NEGATIVE
Specific Gravity, Urine: 1.019
Urobilinogen, UA: 1
pH: 5.5

## 2011-08-13 LAB — HEMOGLOBIN: Hemoglobin: 10.5 — ABNORMAL LOW

## 2011-08-13 LAB — CULTURE, BLOOD (ROUTINE X 2): Culture: NO GROWTH

## 2011-08-14 LAB — PROTIME-INR
INR: 1
Prothrombin Time: 13.6

## 2011-08-14 LAB — URINALYSIS, ROUTINE W REFLEX MICROSCOPIC
Glucose, UA: NEGATIVE
Hgb urine dipstick: NEGATIVE
Ketones, ur: NEGATIVE
Nitrite: NEGATIVE
Protein, ur: NEGATIVE
Specific Gravity, Urine: 1.027
Urobilinogen, UA: 1
pH: 5.5

## 2011-08-14 LAB — TYPE AND SCREEN
ABO/RH(D): O POS
Antibody Screen: NEGATIVE

## 2011-08-14 LAB — ABO/RH: ABO/RH(D): O POS

## 2011-08-14 LAB — APTT: aPTT: 27

## 2012-02-01 ENCOUNTER — Emergency Department (HOSPITAL_COMMUNITY)
Admission: EM | Admit: 2012-02-01 | Discharge: 2012-02-01 | Disposition: A | Discharge status: Home / Self Care | Payer: Medicare Other | Attending: Emergency Medicine | Admitting: Emergency Medicine

## 2012-02-01 ENCOUNTER — Encounter (HOSPITAL_COMMUNITY): Payer: Self-pay | Admitting: *Deleted

## 2012-02-01 DIAGNOSIS — I4891 Unspecified atrial fibrillation: Secondary | ICD-10-CM | POA: Insufficient documentation

## 2012-02-01 DIAGNOSIS — Z79899 Other long term (current) drug therapy: Secondary | ICD-10-CM | POA: Diagnosis not present

## 2012-02-01 DIAGNOSIS — Z760 Encounter for issue of repeat prescription: Secondary | ICD-10-CM | POA: Insufficient documentation

## 2012-02-01 DIAGNOSIS — E785 Hyperlipidemia, unspecified: Secondary | ICD-10-CM | POA: Diagnosis not present

## 2012-02-01 DIAGNOSIS — I10 Essential (primary) hypertension: Secondary | ICD-10-CM | POA: Diagnosis not present

## 2012-02-01 HISTORY — DX: Essential (primary) hypertension: I10

## 2012-02-01 HISTORY — DX: Unspecified atrial fibrillation: I48.91

## 2012-02-01 HISTORY — DX: Hyperlipidemia, unspecified: E78.5

## 2012-02-01 MED ORDER — METOPROLOL TARTRATE 50 MG PO TABS: 25.0000 mg | ORAL_TABLET | Freq: Every day | ORAL | Status: DC

## 2012-02-01 NOTE — ED Provider Notes (Signed)
History     CSN: [id number]  Arrival date & time 02/01/12  1247   First MD Initiated Contact with Patient 02/01/12 1255      Chief Complaint  Patient presents with  . Medication Refill     The history is provided by the patient.   the patient reports she ran out of his metoprolol today.  He takes 25 mg once a day.  He gets his medications refilled at the [facility] hospital.  He did not realize he was about to run out.  Patient is without complaints.  He denies chest pain shortness breath.  He denies palpitations.  He has no other complaints.  Past Medical History  Diagnosis Date  . Hypertension   . Hyperlipidemia   . Atrial fibrillation     No past surgical history on file.  No family history on file.  History  Substance Use Topics  . Smoking status: Not on file  . Smokeless tobacco: Not on file  . Alcohol Use:       Review of Systems  All other systems reviewed and are negative.    Allergies  Review of patient's allergies indicates no known allergies.  Home Medications   Current Outpatient Rx  Name Route Sig Dispense Refill  . VITAMIN D 1000 UNITS PO TABS Oral Take 1,000 Units by mouth daily.    Marland Kitchen CLOPIDOGREL BISULFATE 75 MG PO TABS Oral Take 75 mg by mouth daily.    Marland Kitchen FERROUS SULFATE 325 (65 FE) MG PO TABS Oral Take 325 mg by mouth daily with breakfast.    . LISINOPRIL 10 MG PO TABS Oral Take 10 mg by mouth daily.    Marland Kitchen METOPROLOL TARTRATE 50 MG PO TABS Oral Take 25 mg by mouth daily.    Marland Kitchen POLYETHYL GLYCOL-PROPYL GLYCOL 0.4-0.3 % OP GEL Ophthalmic Apply 1 drop to eye daily.    Marland Kitchen PRAVASTATIN SODIUM 20 MG PO TABS Oral Take 20 mg by mouth at bedtime.    Marland Kitchen VITAMIN B-12 100 MCG PO TABS Oral Take 50 mcg by mouth daily.    Marland Kitchen VITAMIN E 400 UNITS PO CAPS Oral Take 400 Units by mouth daily.    Marland Kitchen METOPROLOL TARTRATE 50 MG PO TABS Oral Take 0.5 tablets (25 mg total) by mouth daily. 30 tablet 4    BP 132/61  Pulse 60  Temp(Src) 97.7 F (36.5 C) (Oral)  Resp 18  SpO2  97%  Physical Exam  Constitutional: He is oriented to person, place, and time. He appears well-developed and well-nourished.  HENT:  Head: Normocephalic.  Eyes: EOM are normal.  Neck: Normal range of motion.  Cardiovascular: Normal rate.   Pulmonary/Chest: Effort normal.  Musculoskeletal: Normal range of motion.  Neurological: He is alert and oriented to person, place, and time.  Psychiatric: He has a normal mood and affect.    ED Course  Procedures (including critical care time)  Labs Reviewed - No data to display No results found.   1. Medication refill       MDM  Patient without complaints.  Medication refill.        Lyanne Co, MD 02/01/12 [phone]

## 2012-02-01 NOTE — ED Notes (Addendum)
Patient obtains medications through the [facility]. Did not realize he was out of some of his medications. Here today requesting refill of metoprolol succinate 50mg   (half tablet daily)

## 2012-02-01 NOTE — Discharge Instructions (Signed)
Medication Refill, Emergency Department  We have refilled your medication today as a courtesy to you. It is best for your medical care, however, to take care of getting refills done through your primary caregiver's office. They have your records and can do a better job of follow-up than we can in the emergency department.  On maintenance medications, we often only prescribe enough medications to get you by until you are able to see your regular caregiver. This is a more expensive way to refill medications.  In the future, please plan for refills so that you will not have to use the emergency department for this.  Thank you for your help. Your help allows us to better take care of the daily emergencies that enter our department.  Document Released: 01/31/2004 Document Revised: 10/03/2011 Document Reviewed: 10/14/2005  [employer] Patient Information 2012 [employer].

## 2012-04-02 DIAGNOSIS — I69998 Other sequelae following unspecified cerebrovascular disease: Secondary | ICD-10-CM | POA: Diagnosis not present

## 2012-04-02 DIAGNOSIS — R002 Palpitations: Secondary | ICD-10-CM | POA: Diagnosis not present

## 2012-04-02 DIAGNOSIS — I359 Nonrheumatic aortic valve disorder, unspecified: Secondary | ICD-10-CM | POA: Diagnosis not present

## 2012-04-02 DIAGNOSIS — I4891 Unspecified atrial fibrillation: Secondary | ICD-10-CM | POA: Diagnosis not present

## 2012-04-10 DIAGNOSIS — R002 Palpitations: Secondary | ICD-10-CM | POA: Diagnosis not present

## 2012-04-10 DIAGNOSIS — I359 Nonrheumatic aortic valve disorder, unspecified: Secondary | ICD-10-CM | POA: Diagnosis not present

## 2012-04-10 DIAGNOSIS — I4891 Unspecified atrial fibrillation: Secondary | ICD-10-CM | POA: Diagnosis not present

## 2012-04-10 DIAGNOSIS — I69998 Other sequelae following unspecified cerebrovascular disease: Secondary | ICD-10-CM | POA: Diagnosis not present

## 2012-09-01 DIAGNOSIS — Z23 Encounter for immunization: Secondary | ICD-10-CM | POA: Diagnosis not present

## 2012-09-10 DIAGNOSIS — I679 Cerebrovascular disease, unspecified: Secondary | ICD-10-CM | POA: Diagnosis not present

## 2012-09-10 DIAGNOSIS — I10 Essential (primary) hypertension: Secondary | ICD-10-CM | POA: Diagnosis not present

## 2012-09-10 DIAGNOSIS — I4891 Unspecified atrial fibrillation: Secondary | ICD-10-CM | POA: Diagnosis not present

## 2012-09-10 DIAGNOSIS — E785 Hyperlipidemia, unspecified: Secondary | ICD-10-CM | POA: Diagnosis not present

## 2012-10-08 DIAGNOSIS — I359 Nonrheumatic aortic valve disorder, unspecified: Secondary | ICD-10-CM | POA: Diagnosis not present

## 2012-10-16 DIAGNOSIS — I359 Nonrheumatic aortic valve disorder, unspecified: Secondary | ICD-10-CM | POA: Diagnosis not present

## 2012-10-16 DIAGNOSIS — R0989 Other specified symptoms and signs involving the circulatory and respiratory systems: Secondary | ICD-10-CM | POA: Diagnosis not present

## 2012-10-16 DIAGNOSIS — R002 Palpitations: Secondary | ICD-10-CM | POA: Diagnosis not present

## 2012-11-12 DIAGNOSIS — R0989 Other specified symptoms and signs involving the circulatory and respiratory systems: Secondary | ICD-10-CM | POA: Diagnosis not present

## 2013-03-08 DIAGNOSIS — I10 Essential (primary) hypertension: Secondary | ICD-10-CM | POA: Diagnosis not present

## 2013-03-08 DIAGNOSIS — Z6826 Body mass index (BMI) 26.0-26.9, adult: Secondary | ICD-10-CM | POA: Diagnosis not present

## 2013-03-08 DIAGNOSIS — I359 Nonrheumatic aortic valve disorder, unspecified: Secondary | ICD-10-CM | POA: Diagnosis not present

## 2013-03-08 DIAGNOSIS — I4891 Unspecified atrial fibrillation: Secondary | ICD-10-CM | POA: Diagnosis not present

## 2013-03-08 DIAGNOSIS — M1710 Unilateral primary osteoarthritis, unspecified knee: Secondary | ICD-10-CM | POA: Diagnosis not present

## 2013-03-08 DIAGNOSIS — IMO0002 Reserved for concepts with insufficient information to code with codable children: Secondary | ICD-10-CM | POA: Diagnosis not present

## 2013-03-08 DIAGNOSIS — E785 Hyperlipidemia, unspecified: Secondary | ICD-10-CM | POA: Diagnosis not present

## 2013-03-08 DIAGNOSIS — I679 Cerebrovascular disease, unspecified: Secondary | ICD-10-CM | POA: Diagnosis not present

## 2013-04-22 DIAGNOSIS — I359 Nonrheumatic aortic valve disorder, unspecified: Secondary | ICD-10-CM | POA: Diagnosis not present

## 2013-04-22 DIAGNOSIS — Z8679 Personal history of other diseases of the circulatory system: Secondary | ICD-10-CM | POA: Diagnosis not present

## 2013-04-22 DIAGNOSIS — I69998 Other sequelae following unspecified cerebrovascular disease: Secondary | ICD-10-CM | POA: Diagnosis not present

## 2013-05-02 ENCOUNTER — Emergency Department (HOSPITAL_COMMUNITY)
Admission: EM | Admit: 2013-05-02 | Discharge: 2013-05-02 | Disposition: A | Discharge status: Home / Self Care | Payer: Medicare Other | Attending: Emergency Medicine | Admitting: Emergency Medicine

## 2013-05-02 ENCOUNTER — Emergency Department (HOSPITAL_COMMUNITY): Payer: Medicare Other

## 2013-05-02 ENCOUNTER — Encounter (HOSPITAL_COMMUNITY): Payer: Self-pay | Admitting: Emergency Medicine

## 2013-05-02 DIAGNOSIS — Z79899 Other long term (current) drug therapy: Secondary | ICD-10-CM | POA: Insufficient documentation

## 2013-05-02 DIAGNOSIS — Y939 Activity, unspecified: Secondary | ICD-10-CM | POA: Insufficient documentation

## 2013-05-02 DIAGNOSIS — Z862 Personal history of diseases of the blood and blood-forming organs and certain disorders involving the immune mechanism: Secondary | ICD-10-CM | POA: Insufficient documentation

## 2013-05-02 DIAGNOSIS — Z7902 Long term (current) use of antithrombotics/antiplatelets: Secondary | ICD-10-CM | POA: Diagnosis not present

## 2013-05-02 DIAGNOSIS — Z23 Encounter for immunization: Secondary | ICD-10-CM | POA: Diagnosis not present

## 2013-05-02 DIAGNOSIS — I10 Essential (primary) hypertension: Secondary | ICD-10-CM | POA: Insufficient documentation

## 2013-05-02 DIAGNOSIS — Y92009 Unspecified place in unspecified non-institutional (private) residence as the place of occurrence of the external cause: Secondary | ICD-10-CM | POA: Insufficient documentation

## 2013-05-02 DIAGNOSIS — W010XXA Fall on same level from slipping, tripping and stumbling without subsequent striking against object, initial encounter: Secondary | ICD-10-CM | POA: Insufficient documentation

## 2013-05-02 DIAGNOSIS — I4891 Unspecified atrial fibrillation: Secondary | ICD-10-CM | POA: Insufficient documentation

## 2013-05-02 DIAGNOSIS — S0100XA Unspecified open wound of scalp, initial encounter: Secondary | ICD-10-CM | POA: Insufficient documentation

## 2013-05-02 DIAGNOSIS — S0190XA Unspecified open wound of unspecified part of head, initial encounter: Secondary | ICD-10-CM | POA: Insufficient documentation

## 2013-05-02 DIAGNOSIS — S0180XA Unspecified open wound of other part of head, initial encounter: Secondary | ICD-10-CM | POA: Insufficient documentation

## 2013-05-02 DIAGNOSIS — S0181XA Laceration without foreign body of other part of head, initial encounter: Secondary | ICD-10-CM

## 2013-05-02 DIAGNOSIS — Z8639 Personal history of other endocrine, nutritional and metabolic disease: Secondary | ICD-10-CM | POA: Insufficient documentation

## 2013-05-02 MED ORDER — TETANUS-DIPHTH-ACELL PERTUSSIS 5-2.5-18.5 LF-MCG/0.5 IM SUSP
0.5000 mL | Freq: Once | INTRAMUSCULAR | Status: AC
  Administered 2013-05-02: 0.5 mL via INTRAMUSCULAR
  Filled 2013-05-02: qty 0.5

## 2013-05-02 MED ORDER — TETANUS-DIPHTH-ACELL PERTUSSIS 5-2.5-18.5 LF-MCG/0.5 IM SUSP: 0.5000 mL | Freq: Once | INTRAMUSCULAR | Status: DC

## 2013-05-02 NOTE — ED Notes (Signed)
Patient reports that he tripped over a drop cord. The patient reports that he was in a standing up  - denies LOC

## 2013-05-02 NOTE — ED Provider Notes (Signed)
Medical screening examination/treatment/procedure(s) were conducted as a shared visit with non-physician practitioner(s) and myself.  I personally evaluated the patient during the encounter  Toy Baker, MD 05/02/13 2038

## 2013-05-02 NOTE — ED Provider Notes (Signed)
LACERATION REPAIR Performed by: Carolee Rota Authorized by: Carolee Rota Consent: Verbal consent obtained. Risks and benefits: risks, benefits and alternatives were discussed Consent given by: patient Patient identity confirmed: provided demographic data Prepped and Draped in normal sterile fashion Wound explored  Laceration Location: L forehead  Laceration Length: 3cm  No Foreign Bodies seen or palpated  Anesthesia: local infiltration  Local anesthetic: lidocaine 2% with epinephrine  Anesthetic total: 4 ml  Irrigation method: skin scrub with dermal cleanser Amount of cleaning: standard  Skin closure: 5-0 Prolene  Number of sutures: 4  Technique: simple interrupted  Patient tolerance: Patient tolerated the procedure well with no immediate complications.   Renne Crigler, PA-C 05/02/13 1932

## 2013-05-02 NOTE — ED Provider Notes (Signed)
History    CSN: [id number] Arrival date & time 05/02/13  1528  First MD Initiated Contact with Patient 05/02/13 1556     Chief Complaint  Patient presents with  . Head Injury  . Head Laceration   (Consider location/radiation/quality/duration/timing/severity/associated sxs/prior Treatment) Patient is a 79 y.o. male presenting with head injury and scalp laceration. The history is provided by the patient.  Head Injury Head Laceration   patient here complaining of 4 head  laceration after tripping over a cord at home. Denies a loss of consciousness. Denies any neck pain. No weakness in his upper or lower extremities. Did have some bleeding was controlled with direct pressure. Does take Plavix. Denies any chest or abdominal pain. No vomiting or confusion. No treatment used prior to arrival and symptoms have been persistent Past Medical History  Diagnosis Date  . Hypertension   . Hyperlipidemia   . Atrial fibrillation    History reviewed. No pertinent past surgical history. No family history on file. History  Substance Use Topics  . Smoking status: Never Smoker   . Smokeless tobacco: Not on file  . Alcohol Use: No    Review of Systems  All other systems reviewed and are negative.    Allergies  Review of patient's allergies indicates no known allergies.  Home Medications   Current Outpatient Rx  Name  Route  Sig  Dispense  Refill  . cholecalciferol (VITAMIN D) 1000 UNITS tablet   Oral   Take 1,000 Units by mouth daily.         . clopidogrel (PLAVIX) 75 MG tablet   Oral   Take 75 mg by mouth daily.         . ferrous sulfate 325 (65 FE) MG tablet   Oral   Take 325 mg by mouth daily with breakfast.         . lisinopril (PRINIVIL,ZESTRIL) 10 MG tablet   Oral   Take 10 mg by mouth daily.         . metoprolol (LOPRESSOR) 50 MG tablet   Oral   Take 25 mg by mouth daily.         Marland Kitchen EXPIRED: metoprolol (LOPRESSOR) 50 MG tablet   Oral   Take 0.5 tablets (25  mg total) by mouth daily.   30 tablet   4   . Polyethyl Glycol-Propyl Glycol (SYSTANE) 0.4-0.3 % GEL   Ophthalmic   Apply 1 drop to eye daily.         . pravastatin (PRAVACHOL) 20 MG tablet   Oral   Take 20 mg by mouth at bedtime.         . vitamin B-12 (CYANOCOBALAMIN) 100 MCG tablet   Oral   Take 50 mcg by mouth daily.         . vitamin E 400 UNIT capsule   Oral   Take 400 Units by mouth daily.          BP 135/71  Pulse 94  Temp(Src) 98.7 F (37.1 C) (Oral)  Resp 20  SpO2 100% Physical Exam  Nursing note and vitals reviewed. Constitutional: He is oriented to person, place, and time. He appears well-developed and well-nourished.  Non-toxic appearance. No distress.  HENT:  Head: Normocephalic and atraumatic.    Eyes: Conjunctivae, EOM and lids are normal. Pupils are equal, round, and reactive to light.  Neck: Normal range of motion. Neck supple. No tracheal deviation present. No mass present.  Cardiovascular: Normal rate, regular rhythm and  normal heart sounds.  Exam reveals no gallop.   No murmur heard. Pulmonary/Chest: Effort normal and breath sounds normal. No stridor. No respiratory distress. He has no decreased breath sounds. He has no wheezes. He has no rhonchi. He has no rales.  Abdominal: Soft. Normal appearance and bowel sounds are normal. He exhibits no distension. There is no tenderness. There is no rebound and no CVA tenderness.  Musculoskeletal: Normal range of motion. He exhibits no edema and no tenderness.  Neurological: He is alert and oriented to person, place, and time. He has normal strength. No cranial nerve deficit or sensory deficit. GCS eye subscore is 4. GCS verbal subscore is 5. GCS motor subscore is 6.  Skin: Skin is warm and dry. No abrasion and no rash noted.  Psychiatric: He has a normal mood and affect. His speech is normal and behavior is normal.    ED Course  Procedures (including critical care time) Labs Reviewed - No data to  display No results found. No diagnosis found.  MDM  Head CT is negative for acute findings. Laceration repair by the physician assistant please see their note.  Toy Baker, MD 05/02/13 [phone]

## 2013-05-07 DIAGNOSIS — Z6826 Body mass index (BMI) 26.0-26.9, adult: Secondary | ICD-10-CM | POA: Diagnosis not present

## 2013-05-07 DIAGNOSIS — I10 Essential (primary) hypertension: Secondary | ICD-10-CM | POA: Diagnosis not present

## 2013-05-07 DIAGNOSIS — Z4802 Encounter for removal of sutures: Secondary | ICD-10-CM | POA: Diagnosis not present

## 2013-05-07 DIAGNOSIS — S1095XA Superficial foreign body of unspecified part of neck, initial encounter: Secondary | ICD-10-CM | POA: Diagnosis not present

## 2013-05-07 DIAGNOSIS — S0005XA Superficial foreign body of scalp, initial encounter: Secondary | ICD-10-CM | POA: Diagnosis not present

## 2013-05-07 DIAGNOSIS — I4891 Unspecified atrial fibrillation: Secondary | ICD-10-CM | POA: Diagnosis not present

## 2013-09-07 DIAGNOSIS — IMO0002 Reserved for concepts with insufficient information to code with codable children: Secondary | ICD-10-CM | POA: Diagnosis not present

## 2013-09-07 DIAGNOSIS — R5381 Other malaise: Secondary | ICD-10-CM | POA: Diagnosis not present

## 2013-09-07 DIAGNOSIS — Z23 Encounter for immunization: Secondary | ICD-10-CM | POA: Diagnosis not present

## 2013-09-07 DIAGNOSIS — E785 Hyperlipidemia, unspecified: Secondary | ICD-10-CM | POA: Diagnosis not present

## 2013-09-07 DIAGNOSIS — I10 Essential (primary) hypertension: Secondary | ICD-10-CM | POA: Diagnosis not present

## 2013-09-07 DIAGNOSIS — I359 Nonrheumatic aortic valve disorder, unspecified: Secondary | ICD-10-CM | POA: Diagnosis not present

## 2013-09-07 DIAGNOSIS — Z0000 Encounter for general adult medical examination without abnormal findings: Secondary | ICD-10-CM | POA: Diagnosis not present

## 2013-09-07 DIAGNOSIS — I4891 Unspecified atrial fibrillation: Secondary | ICD-10-CM | POA: Diagnosis not present

## 2013-09-07 DIAGNOSIS — M1710 Unilateral primary osteoarthritis, unspecified knee: Secondary | ICD-10-CM | POA: Diagnosis not present

## 2013-09-07 DIAGNOSIS — I679 Cerebrovascular disease, unspecified: Secondary | ICD-10-CM | POA: Diagnosis not present

## 2013-09-07 DIAGNOSIS — Z1331 Encounter for screening for depression: Secondary | ICD-10-CM | POA: Diagnosis not present

## 2013-09-21 DIAGNOSIS — R5381 Other malaise: Secondary | ICD-10-CM | POA: Diagnosis not present

## 2013-09-21 DIAGNOSIS — I10 Essential (primary) hypertension: Secondary | ICD-10-CM | POA: Diagnosis not present

## 2013-09-21 DIAGNOSIS — Z6826 Body mass index (BMI) 26.0-26.9, adult: Secondary | ICD-10-CM | POA: Diagnosis not present

## 2013-09-21 DIAGNOSIS — I679 Cerebrovascular disease, unspecified: Secondary | ICD-10-CM | POA: Diagnosis not present

## 2013-09-21 DIAGNOSIS — R42 Dizziness and giddiness: Secondary | ICD-10-CM | POA: Diagnosis not present

## 2013-11-29 ENCOUNTER — Emergency Department (HOSPITAL_COMMUNITY): Payer: Medicare Other

## 2013-11-29 ENCOUNTER — Inpatient Hospital Stay (HOSPITAL_COMMUNITY)
Admission: EM | Admit: 2013-11-29 | Discharge: 2013-12-02 | DRG: 308 | Disposition: A | Discharge status: Rehabilitation Facility | Payer: Medicare Other | Attending: Cardiology | Admitting: Cardiology

## 2013-11-29 ENCOUNTER — Encounter (HOSPITAL_COMMUNITY): Payer: Self-pay | Admitting: Emergency Medicine

## 2013-11-29 DIAGNOSIS — R4182 Altered mental status, unspecified: Secondary | ICD-10-CM | POA: Diagnosis not present

## 2013-11-29 DIAGNOSIS — I672 Cerebral atherosclerosis: Secondary | ICD-10-CM | POA: Diagnosis present

## 2013-11-29 DIAGNOSIS — R6889 Other general symptoms and signs: Secondary | ICD-10-CM | POA: Diagnosis not present

## 2013-11-29 DIAGNOSIS — Z96659 Presence of unspecified artificial knee joint: Secondary | ICD-10-CM

## 2013-11-29 DIAGNOSIS — I509 Heart failure, unspecified: Secondary | ICD-10-CM | POA: Diagnosis not present

## 2013-11-29 DIAGNOSIS — I639 Cerebral infarction, unspecified: Secondary | ICD-10-CM

## 2013-11-29 DIAGNOSIS — E860 Dehydration: Secondary | ICD-10-CM | POA: Diagnosis not present

## 2013-11-29 DIAGNOSIS — G3184 Mild cognitive impairment, so stated: Secondary | ICD-10-CM | POA: Diagnosis present

## 2013-11-29 DIAGNOSIS — I5032 Chronic diastolic (congestive) heart failure: Secondary | ICD-10-CM | POA: Diagnosis not present

## 2013-11-29 DIAGNOSIS — I6789 Other cerebrovascular disease: Secondary | ICD-10-CM | POA: Diagnosis not present

## 2013-11-29 DIAGNOSIS — I10 Essential (primary) hypertension: Secondary | ICD-10-CM | POA: Diagnosis present

## 2013-11-29 DIAGNOSIS — E785 Hyperlipidemia, unspecified: Secondary | ICD-10-CM | POA: Diagnosis present

## 2013-11-29 DIAGNOSIS — Z8673 Personal history of transient ischemic attack (TIA), and cerebral infarction without residual deficits: Secondary | ICD-10-CM

## 2013-11-29 DIAGNOSIS — R5381 Other malaise: Secondary | ICD-10-CM | POA: Diagnosis not present

## 2013-11-29 DIAGNOSIS — R0989 Other specified symptoms and signs involving the circulatory and respiratory systems: Secondary | ICD-10-CM | POA: Diagnosis not present

## 2013-11-29 DIAGNOSIS — I6350 Cerebral infarction due to unspecified occlusion or stenosis of unspecified cerebral artery: Secondary | ICD-10-CM

## 2013-11-29 DIAGNOSIS — I482 Chronic atrial fibrillation, unspecified: Secondary | ICD-10-CM | POA: Diagnosis present

## 2013-11-29 DIAGNOSIS — Z79899 Other long term (current) drug therapy: Secondary | ICD-10-CM | POA: Diagnosis not present

## 2013-11-29 DIAGNOSIS — Z7902 Long term (current) use of antithrombotics/antiplatelets: Secondary | ICD-10-CM | POA: Diagnosis not present

## 2013-11-29 DIAGNOSIS — I4891 Unspecified atrial fibrillation: Secondary | ICD-10-CM

## 2013-11-29 DIAGNOSIS — I6340 Cerebral infarction due to embolism of unspecified cerebral artery: Secondary | ICD-10-CM | POA: Diagnosis not present

## 2013-11-29 DIAGNOSIS — G8110 Spastic hemiplegia affecting unspecified side: Secondary | ICD-10-CM | POA: Diagnosis not present

## 2013-11-29 DIAGNOSIS — I6330 Cerebral infarction due to thrombosis of unspecified cerebral artery: Secondary | ICD-10-CM | POA: Diagnosis not present

## 2013-11-29 LAB — COMPREHENSIVE METABOLIC PANEL
ALT: 18 U/L (ref 0–53)
AST: 54 U/L — ABNORMAL HIGH (ref 0–37)
Albumin: 3.5 g/dL (ref 3.5–5.2)
Alkaline Phosphatase: 76 U/L (ref 39–117)
BUN: 24 mg/dL — ABNORMAL HIGH (ref 6–23)
CO2: 19 mEq/L (ref 19–32)
Calcium: 9.3 mg/dL (ref 8.4–10.5)
Chloride: 107 mEq/L (ref 96–112)
Creatinine, Ser: 1.3 mg/dL (ref 0.50–1.35)
GFR calc Af Amer: 54 mL/min — ABNORMAL LOW (ref >90)
GFR calc non Af Amer: 47 mL/min — ABNORMAL LOW (ref >90)
Glucose, Bld: 114 mg/dL — ABNORMAL HIGH (ref 70–99)
Potassium: 4.6 mEq/L (ref 3.7–5.3)
Sodium: 143 mEq/L (ref 137–147)
Total Bilirubin: 0.7 mg/dL (ref 0.3–1.2)
Total Protein: 7.3 g/dL (ref 6.0–8.3)

## 2013-11-29 LAB — CBC WITH DIFFERENTIAL/PLATELET
Basophils Absolute: 0 10*3/uL (ref 0.0–0.1)
Basophils Relative: 0 % (ref 0–1)
Eosinophils Absolute: 0 10*3/uL (ref 0.0–0.7)
Eosinophils Relative: 0 % (ref 0–5)
HCT: 41.4 % (ref 39.0–52.0)
Hemoglobin: 13.9 g/dL (ref 13.0–17.0)
Lymphocytes Relative: 12 % (ref 12–46)
Lymphs Abs: 1.5 10*3/uL (ref 0.7–4.0)
MCH: 32 pg (ref 26.0–34.0)
MCHC: 33.6 g/dL (ref 30.0–36.0)
MCV: 95.4 fL (ref 78.0–100.0)
Monocytes Absolute: 1.2 10*3/uL — ABNORMAL HIGH (ref 0.1–1.0)
Monocytes Relative: 9 % (ref 3–12)
Neutro Abs: 9.7 10*3/uL — ABNORMAL HIGH (ref 1.7–7.7)
Neutrophils Relative %: 78 % — ABNORMAL HIGH (ref 43–77)
Platelets: 246 10*3/uL (ref 150–400)
RBC: 4.34 MIL/uL (ref 4.22–5.81)
RDW: 13.2 % (ref 11.5–15.5)
WBC: 12.4 10*3/uL — ABNORMAL HIGH (ref 4.0–10.5)

## 2013-11-29 LAB — URINALYSIS, ROUTINE W REFLEX MICROSCOPIC
Glucose, UA: NEGATIVE mg/dL
Hgb urine dipstick: NEGATIVE
Ketones, ur: 15 mg/dL — AB
Leukocytes, UA: NEGATIVE
Nitrite: NEGATIVE
Protein, ur: NEGATIVE mg/dL
Specific Gravity, Urine: 1.021 (ref 1.005–1.030)
Urobilinogen, UA: 0.2 mg/dL (ref 0.0–1.0)
pH: 5.5 (ref 5.0–8.0)

## 2013-11-29 LAB — PRO B NATRIURETIC PEPTIDE: Pro B Natriuretic peptide (BNP): 1916 pg/mL — ABNORMAL HIGH (ref 0–450)

## 2013-11-29 LAB — TROPONIN I: Troponin I: <0.3 ng/mL (ref <0.30)

## 2013-11-29 LAB — MRSA PCR SCREENING: MRSA by PCR: NEGATIVE

## 2013-11-29 LAB — CK: Total CK: 1595 U/L — ABNORMAL HIGH (ref 7–232)

## 2013-11-29 MED ORDER — SODIUM CHLORIDE 0.9 % IV SOLN
INTRAVENOUS | Status: DC
  Administered 2013-11-29 (×2): via INTRAVENOUS

## 2013-11-29 MED ORDER — DILTIAZEM HCL 100 MG IV SOLR
5.0000 mg/h | INTRAVENOUS | Status: DC
  Administered 2013-11-29: 5 mg/h via INTRAVENOUS
  Administered 2013-11-29: 15 mg/h via INTRAVENOUS
  Administered 2013-11-30 (×2): 10 mg/h via INTRAVENOUS
  Administered 2013-12-01: 5 mg/h via INTRAVENOUS

## 2013-11-29 MED ORDER — DEXTROSE-NACL 5-0.45 % IV SOLN
INTRAVENOUS | Status: AC
Start: 2013-11-29 — End: 2013-11-30
  Administered 2013-11-29: via INTRAVENOUS

## 2013-11-29 MED ORDER — ASPIRIN 300 MG RE SUPP
300.0000 mg | Freq: Every day | RECTAL | Status: DC
  Administered 2013-11-30: 300 mg via RECTAL
  Filled 2013-11-29 (×2): qty 1

## 2013-11-29 MED ORDER — ASPIRIN 325 MG PO TABS
325.0000 mg | ORAL_TABLET | Freq: Every day | ORAL | Status: DC
  Administered 2013-12-01: 325 mg via ORAL
  Filled 2013-11-29 (×2): qty 1

## 2013-11-29 MED ORDER — METOPROLOL TARTRATE 1 MG/ML IV SOLN
2.5000 mg | Freq: Once | INTRAVENOUS | Status: AC
  Administered 2013-11-29: 2.5 mg via INTRAVENOUS
  Filled 2013-11-29: qty 5

## 2013-11-29 MED ORDER — SENNOSIDES-DOCUSATE SODIUM 8.6-50 MG PO TABS
2.0000 | ORAL_TABLET | Freq: Every evening | ORAL | Status: DC | PRN
  Filled 2013-11-29: qty 2

## 2013-11-29 MED ORDER — METOPROLOL TARTRATE 1 MG/ML IV SOLN
2.5000 mg | Freq: Four times a day (QID) | INTRAVENOUS | Status: DC
  Administered 2013-11-29 – 2013-12-01 (×5): 2.5 mg via INTRAVENOUS
  Filled 2013-11-29 (×10): qty 5

## 2013-11-29 MED ORDER — FUROSEMIDE 10 MG/ML IJ SOLN
40.0000 mg | Freq: Once | INTRAMUSCULAR | Status: AC
  Administered 2013-11-29: 40 mg via INTRAVENOUS
  Filled 2013-11-29: qty 4

## 2013-11-29 NOTE — Consult Note (Signed)
Referring Physician: Tisovec    Chief Complaint: Weakness  HPI: [name] is an 80 y.o. male who has had generalized weakness for the past 2 days.  The patient is very difficult to get a history from.  He reports that because he has been weak he lowers himself to the floor.  On yesterday he was on the floor about 4 hours before he was able tog et himself up.  Today he called his friend because he could not get off the floor.  His friend called paramedics.  His friend also felt that the patient had abnormal behavior.  Date last known well: Unable to determine Time last known well: Unable to determine tPA Given: No: Unable to determine LKW  Past Medical History  Diagnosis Date  . Hypertension   . Hyperlipidemia   . Atrial fibrillation     Past surgical history: Bilateral knee replacement, left shoulder rotator cuff surgery  Family history: Mother died due to a cerebral aneurysm, father died with what appears to be an abdominal aortic aneurysm.  Social History:  reports that he has never smoked. He does not have any smokeless tobacco history on file. He reports that he does not drink alcohol or use illicit drugs.  Allergies:  Allergies  Allergen Reactions  . Codeine Nausea And Vomiting    Medications:  I have reviewed the patient's current medications. Prior to Admission:  Prescriptions prior to admission  Medication Sig Dispense Refill  . cholecalciferol (VITAMIN D) 1000 UNITS tablet Take 1,000 Units by mouth daily.      . clopidogrel (PLAVIX) 75 MG tablet Take 75 mg by mouth daily.      . ferrous sulfate 325 (65 FE) MG tablet Take 325 mg by mouth daily with breakfast.      . lisinopril (PRINIVIL,ZESTRIL) 10 MG tablet Take 10 mg by mouth daily.      . metoprolol (LOPRESSOR) 50 MG tablet Take 25 mg by mouth daily.      Vladimir Faster Glycol-Propyl Glycol (SYSTANE) 0.4-0.3 % GEL Place 1 drop into both eyes at bedtime.       . pravastatin (PRAVACHOL) 20 MG tablet Take 20 mg by  mouth at bedtime.      . vitamin B-12 (CYANOCOBALAMIN) 100 MCG tablet Take 100 mcg by mouth daily.       . vitamin E 400 UNIT capsule Take 400 Units by mouth daily.       Scheduled: . aspirin  300 mg Rectal Daily   Or  . aspirin  325 mg Oral Daily  . metoprolol  2.5 mg Intravenous Q6H    ROS: History obtained from the patient  General ROS: negative for - chills, fatigue, fever, night sweats, weight gain or weight loss Psychological ROS: negative for - behavioral disorder, hallucinations, memory difficulties, mood swings or suicidal ideation Ophthalmic ROS: negative for - blurry vision, double vision, eye pain or loss of vision ENT ROS: negative for - epistaxis, nasal discharge, oral lesions, sore throat, tinnitus or vertigo Allergy and Immunology ROS: negative for - hives or itchy/watery eyes Hematological and Lymphatic ROS: negative for - bleeding problems, bruising or swollen lymph nodes Endocrine ROS: negative for - galactorrhea, hair pattern changes, polydipsia/polyuria or temperature intolerance Respiratory ROS: negative for - cough, hemoptysis, shortness of breath or wheezing Cardiovascular ROS: negative for - chest pain, dyspnea on exertion, edema or irregular heartbeat Gastrointestinal ROS: negative for - abdominal pain, diarrhea, hematemesis, nausea/vomiting or stool incontinence Genito-Urinary ROS: negative for - dysuria,  hematuria, incontinence or urinary frequency/urgency Musculoskeletal ROS: negative for - joint swelling Neurological ROS: as noted in HPI Dermatological ROS: negative for rash and skin lesion changes  Physical Examination: Blood pressure 121/94, pulse 161, temperature 98.4 F (36.9 C), temperature source Oral, resp. rate 21, height 5\' 5"  (1.651 m), weight 66.4 kg (146 lb 6.2 oz), SpO2 96.00%.  Neurologic Examination: Mental Status: Alert, oriented.  Speech fluent without evidence of aphasia.  Able to follow 3 step commands without difficulty. Cranial  Nerves: II: Discs flat bilaterally; Visual fields grossly normal, pupils equal, round, reactive to light and accommodation III,IV, VI: ptosis not present, extra-ocular motions intact bilaterally V,VII: left facial droop, facial light touch sensation normal bilaterally VIII: hearing normal bilaterally IX,X: gag reflex present XI: bilateral shoulder shrug XII: midline tongue extension Motor: Right : Upper extremity   5/5    Left:     Upper extremity   Limited range of motion with full strength within range  Lower extremity   5/5     Lower extremity   5/5 Tone and bulk:normal tone throughout; no atrophy noted Sensory: Pinprick and light touch intact throughout, bilaterally Deep Tendon Reflexes: 1+ in the upper extremity and absent in the lower extremities Plantars: Right: downgoing   Left: downgoing Cerebellar: normal finger-to-nose and normal heel-to-shin test Gait: Unable to test safely CV: pulses palpable throughout    Laboratory Studies:  Basic Metabolic Panel:  Recent Labs Lab 11/29/13 1531  NA 143  K 4.6  CL 107  CO2 19  GLUCOSE 114*  BUN 24*  CREATININE 1.30  CALCIUM 9.3    Liver Function Tests:  Recent Labs Lab 11/29/13 1531  AST 54*  ALT 18  ALKPHOS 76  BILITOT 0.7  PROT 7.3  ALBUMIN 3.5   No results found for this basename: LIPASE, AMYLASE,  in the last 168 hours No results found for this basename: AMMONIA,  in the last 168 hours  CBC:  Recent Labs Lab 11/29/13 1531  WBC 12.4*  NEUTROABS 9.7*  HGB 13.9  HCT 41.4  MCV 95.4  PLT 246    Cardiac Enzymes:  Recent Labs Lab 11/29/13 1531  CKTOTAL 1595*  TROPONINI <0.30    BNP: No components found with this basename: POCBNP,   CBG: No results found for this basename: GLUCAP,  in the last 168 hours  Microbiology: Results for orders placed during the hospital encounter of 01/23/10  CULTURE, BLOOD (ROUTINE X 2)     Status: None   Collection Time    01/22/10  9:41 PM      Result Value  Range Status   Specimen Description     Final   Value: BLOOD BOTTLES DRAWN AEROBIC AND ANAEROBIC LEFT HAND AERO 5CC ANA 4CC   Special Requests NONE   Final   Culture NO GROWTH 5 DAYS   Final   Report Status 01/29/2010 FINAL   Final  CULTURE, BLOOD (ROUTINE X 2)     Status: None   Collection Time    01/22/10  9:44 PM      Result Value Range Status   Specimen Description     Final   Value: BLOOD BOTTLES DRAWN AEROBIC AND ANAEROBIC RIGHT HAND [facility] EACH   Special Requests NONE   Final   Culture NO GROWTH 5 DAYS   Final   Report Status 01/29/2010 FINAL   Final  MRSA PCR SCREENING     Status: None   Collection Time    01/23/10  3:18 AM  Result Value Range Status   MRSA by PCR    NEGATIVE Final   Value: NEGATIVE            The GeneXpert MRSA Assay (FDA     approved for NASAL specimens     only), is one component of a     comprehensive MRSA colonization     surveillance program. It is not     intended to diagnose MRSA     infection nor to guide or     monitor treatment for     MRSA infections.    Coagulation Studies: No results found for this basename: LABPROT, INR,  in the last 72 hours  Urinalysis:  Recent Labs Lab 11/29/13 1619  COLORURINE YELLOW  LABSPEC 1.021  PHURINE 5.5  GLUCOSEU NEGATIVE  HGBUR NEGATIVE  BILIRUBINUR SMALL*  KETONESUR 15*  PROTEINUR NEGATIVE  UROBILINOGEN 0.2  NITRITE NEGATIVE  LEUKOCYTESUR NEGATIVE    Lipid Panel:    Component Value Date/Time   CHOL  Value: 162        ATP III CLASSIFICATION:  <200     mg/dL   Desirable  200-239  mg/dL   Borderline High  >=240    mg/dL   High 04/21/2008 0515   TRIG 80 04/21/2008 0515   HDL 37* 04/21/2008 0515   CHOLHDL 4.4 04/21/2008 0515   VLDL 16 04/21/2008 0515   LDLCALC  Value: 109        Total Cholesterol/HDL:CHD Risk Coronary Heart Disease Risk Table                     Men   Women  1/2 Average Risk   3.4   3.3* 04/21/2008 0515    HgbA1C:  Lab Results  Component Value Date   HGBA1C  Value: 5.5  (NOTE)   The ADA recommends the following therapeutic goal for glycemic   control related to Hgb A1C measurement:   Goal of Therapy:   < 7.0% Hgb A1C   Reference: American Diabetes Association: Clinical Practice   Recommendations 2008, Diabetes Care,  2008, 31:(Suppl 1). 04/21/2008    Urine Drug Screen:     Component Value Date/Time   LABOPIA POSITIVE* 01/22/2010 2015   COCAINSCRNUR NONE DETECTED 01/22/2010 2015   LABBENZ POSITIVE* 01/22/2010 2015   AMPHETMU NONE DETECTED 01/22/2010 2015   THCU NONE DETECTED 01/22/2010 2015   LABBARB  Value: NONE DETECTED        DRUG SCREEN FOR MEDICAL PURPOSES ONLY.  IF CONFIRMATION IS NEEDED FOR ANY PURPOSE, NOTIFY LAB WITHIN 5 DAYS.        LOWEST DETECTABLE LIMITS FOR URINE DRUG SCREEN Drug Class       Cutoff (ng/mL) Amphetamine      1000 Barbiturate      200 Benzodiazepine   564 Tricyclics       332 Opiates          300 Cocaine          300 THC              50 01/22/2010 2015    Alcohol Level: No results found for this basename: ETH,  in the last 168 hours  Other results: EKG: atrial fibrillation with RVR at 151.  Imaging: Ct Head Wo Contrast  11/29/2013   CLINICAL DATA:  Altered mental status.  EXAM: CT HEAD WITHOUT CONTRAST  TECHNIQUE: Contiguous axial images were obtained from the base of the skull through the vertex without intravenous contrast.  COMPARISON:  05/02/2013.  FINDINGS: Low density involving the gray and white matter in a right middle cerebral artery distribution. Patchy white matter low density elsewhere in both cerebral hemispheres without significant change. No significant change in diffuse enlargement of the ventricles and subarachnoid spaces. 4 mm of shift of midline structures from right to left. No evidence of transtentorial herniation. No intracranial hemorrhage. Dense arterial calcifications at the skull base. Left maxillary sinus mucosal thickening with a maximum thickness of 5 mm. Left maxillary sinus retention cyst.  IMPRESSION: 1.  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 2. Stable atrophy and chronic small vessel white matter ischemic changes in both cerebral hemispheres. 3. Chronic left maxillary sinusitis and retention cyst without significant change.   Electronically Signed   By: Enrique Sack M.D.   On: 11/29/2013 17:17   Dg Chest Port 1 View  11/29/2013   CLINICAL DATA:  Altered mental status.  EXAM: PORTABLE CHEST - 1 VIEW  COMPARISON:  04/04/2009.  FINDINGS: Interval enlargement of the cardiac silhouette with a mild increase in prominence of the pulmonary vasculature and interstitial markings, all accentuated by a decreased inspiration and the portable AP technique. A large hiatal hernia is again demonstrated. Prominence of the superior mediastinum is not changed significantly. Bilateral shoulder degenerative changes with superior migration of both humeral heads, compatible with large bilateral chronic rotator cuff tears.  IMPRESSION: 1. Interval cardiomegaly, mild pulmonary vascular congestion and probable mild interstitial pulmonary edema. 2. Stable large hiatal hernia. 3. 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   Electronically Signed   By: Enrique Sack M.D.   On: 11/29/2013 17:05    Assessment: 80 y.o. male presenting with a complaint of generalized weakness.  In atrial fibrillation with RVR.  Friends complained of strange behavior.  Head CT performed and reviewed.  CT shows a hypodensity in the right MCA distribution with 3mm midline shift.  Interestingly, neurological examination is quite benign and shows no significant focality.  Would be concerned about another etiology other than stroke.  Further work up recommended.    Stroke Risk Factors - atrial fibrillation, hyperlipidemia and hypertension  Plan: 1. HgbA1c, fasting lipid panel 2. MRI, MRA  of the brain without contrast 3. PT consult, OT consult, Speech consult 4.  Echocardiogram 5. Carotid dopplers 6. Prophylactic therapy-Agree with continued aspirin 7. Cardiology addressing atrial fibrillation with RVR 8. Telemetry monitoring 9. Frequent neuro checks   Alexis Goodell, MD [facility] [phone] 11/29/2013, 8:32 PM

## 2013-11-29 NOTE — ED Notes (Signed)
Pt states that he was trying to use the restroom at home when he fell and could not get up. Denies hitting his head.

## 2013-11-29 NOTE — H&P (Addendum)
[name] is an 80 y.o. male.   Chief Complaint: Generalised weakness HPI: Ms. [name] is a very pleasant 80 year old Caucasian male, who was about to turn 80 years of age tomorrow, who I have been seeing for the past 3 years and states that he has had a stroke in 2009, was told to have atrial fibrillation at that time.  No further records are available to me and when he establish with me, he was in sinus rhythm and was on aspirin and Plavix.  I been seeing him for the last 3 years and he is always maintained sinus rhythm with PVCs and PACs.  He has had a normal stress EKG on 02/26/2011 and an echocardiogram in June 2013 had revealed moderate aortic regurgitation, trace aortic stenosis, unchanged from 2004.  He was doing well until yesterday, he states that he fell from his bed, could not get up as he felt extremely weak in his arms.  His neighbor, who he calls her a friend, called on him to check on him today and as she did not get any reply, she went to his house and found him laying on the floor.  He was then eventually transported via EMS to the emergency room.  Patient responds appropriately and is alert and oriented 3, he recognized me immediately on entering the room.  In fact he was is jovial self except he was extremely fatigued and obviously dehydrated.  He denied any chest pain or shortness of breath.  No syncope or dizziness prior to the onset of fall.  No leg edema.  No visual disturbances, no speech disturbances.  No incontinence.  No change in his weight.  Past Medical History  Diagnosis Date  . Hypertension   . Hyperlipidemia   . Atrial fibrillation     History reviewed. No pertinent past surgical history.  No family history on file. Social History:  reports that he has never smoked. He does not have any smokeless tobacco history on file. He reports that he does not drink alcohol or use illicit drugs.  Allergies:  Allergies  Allergen Reactions  . Codeine Nausea  And Vomiting    Medications Prior to Admission  Medication Sig Dispense Refill  . cholecalciferol (VITAMIN D) 1000 UNITS tablet Take 1,000 Units by mouth daily.      . clopidogrel (PLAVIX) 75 MG tablet Take 75 mg by mouth daily.      . ferrous sulfate 325 (65 FE) MG tablet Take 325 mg by mouth daily with breakfast.      . lisinopril (PRINIVIL,ZESTRIL) 10 MG tablet Take 10 mg by mouth daily.      . metoprolol (LOPRESSOR) 50 MG tablet Take 25 mg by mouth daily.      Vladimir Faster Glycol-Propyl Glycol (SYSTANE) 0.4-0.3 % GEL Place 1 drop into both eyes at bedtime.       . pravastatin (PRAVACHOL) 20 MG tablet Take 20 mg by mouth at bedtime.      . vitamin B-12 (CYANOCOBALAMIN) 100 MCG tablet Take 100 mcg by mouth daily.       . vitamin E 400 UNIT capsule Take 400 Units by mouth daily.       Review of Systems - Negative except generalized weakness, denies neurological deficit, denies any GI disturbances.  No fever, nausea or vomiting.  Blood pressure 122/99, pulse 98, temperature 98.4 F (36.9 C), temperature source Oral, resp. rate 21, height 5' 5" (1.651 m), weight 66.4 kg (146 lb 6.2 oz), SpO2  96.00%. General appearance: alert, cooperative, appears stated age, no distress and appears dehydrated. Eyes: negative findings: lids and lashes normal Neck: no adenopathy, no carotid bruit, no JVD, supple, symmetrical, trachea midline and thyroid not enlarged, symmetric, no tenderness/mass/nodules Neck: JVP - normal, carotids 2+= without bruits Resp: clear to auscultation bilaterally Chest wall: no tenderness Cardio: Tachycardia present, S1 variable with normal S2.  II/VI SEM in apex and II/VI EDM in the right sternal border. GI: soft, non-tender; bowel sounds normal; no masses,  no organomegaly Extremities: extremities normal, atraumatic, no cyanosis or edema Pulses: 2+ and symmetric Skin: loss of skin turgor Neurologic: Grossly normal  Results for orders placed during the hospital encounter of  11/29/13 (from the past 48 hour(s))  TROPONIN I     Status: None   Collection Time    11/29/13  3:31 PM      Result Value Range   Troponin I <0.30  <0.30 ng/mL   Comment:            Due to the release kinetics of cTnI,     a negative result within the first hours     of the onset of symptoms does not rule out     myocardial infarction with certainty.     If myocardial infarction is still suspected,     repeat the test at appropriate intervals.  CBC WITH DIFFERENTIAL     Status: Abnormal   Collection Time    11/29/13  3:31 PM      Result Value Range   WBC 12.4 (*) 4.0 - 10.5 K/uL   RBC 4.34  4.22 - 5.81 MIL/uL   Hemoglobin 13.9  13.0 - 17.0 g/dL   HCT 41.4  39.0 - 52.0 %   MCV 95.4  78.0 - 100.0 fL   MCH 32.0  26.0 - 34.0 pg   MCHC 33.6  30.0 - 36.0 g/dL   RDW 13.2  11.5 - 15.5 %   Platelets 246  150 - 400 K/uL   Neutrophils Relative % 78 (*) 43 - 77 %   Neutro Abs 9.7 (*) 1.7 - 7.7 K/uL   Lymphocytes Relative 12  12 - 46 %   Lymphs Abs 1.5  0.7 - 4.0 K/uL   Monocytes Relative 9  3 - 12 %   Monocytes Absolute 1.2 (*) 0.1 - 1.0 K/uL   Eosinophils Relative 0  0 - 5 %   Eosinophils Absolute 0.0  0.0 - 0.7 K/uL   Basophils Relative 0  0 - 1 %   Basophils Absolute 0.0  0.0 - 0.1 K/uL  COMPREHENSIVE METABOLIC PANEL     Status: Abnormal   Collection Time    11/29/13  3:31 PM      Result Value Range   Sodium 143  137 - 147 mEq/L   Potassium 4.6  3.7 - 5.3 mEq/L   Chloride 107  96 - 112 mEq/L   CO2 19  19 - 32 mEq/L   Glucose, Bld 114 (*) 70 - 99 mg/dL   BUN 24 (*) 6 - 23 mg/dL   Creatinine, Ser 1.30  0.50 - 1.35 mg/dL   Calcium 9.3  8.4 - 10.5 mg/dL   Total Protein 7.3  6.0 - 8.3 g/dL   Albumin 3.5  3.5 - 5.2 g/dL   AST 54 (*) 0 - 37 U/L   ALT 18  0 - 53 U/L   Alkaline Phosphatase 76  39 - 117 U/L   Total Bilirubin 0.7  0.3 - 1.2 mg/dL   GFR calc non Af Amer 47 (*) >90 mL/min   GFR calc Af Amer 54 (*) >90 mL/min   Comment: (NOTE)     The eGFR has been calculated using  the CKD EPI equation.     This calculation has not been validated in all clinical situations.     eGFR's persistently <90 mL/min signify possible Chronic Kidney     Disease.  PRO B NATRIURETIC PEPTIDE     Status: Abnormal   Collection Time    11/29/13  3:31 PM      Result Value Range   Pro B Natriuretic peptide (BNP) 1916.0 (*) 0 - 450 pg/mL  CK     Status: Abnormal   Collection Time    11/29/13  3:31 PM      Result Value Range   Total CK 1595 (*) 7 - 232 U/L  URINALYSIS, ROUTINE W REFLEX MICROSCOPIC     Status: Abnormal   Collection Time    11/29/13  4:19 PM      Result Value Range   Color, Urine YELLOW  YELLOW   APPearance HAZY (*) CLEAR   Specific Gravity, Urine 1.021  1.005 - 1.030   pH 5.5  5.0 - 8.0   Glucose, UA NEGATIVE  NEGATIVE mg/dL   Hgb urine dipstick NEGATIVE  NEGATIVE   Bilirubin Urine SMALL (*) NEGATIVE   Ketones, ur 15 (*) NEGATIVE mg/dL   Protein, ur NEGATIVE  NEGATIVE mg/dL   Urobilinogen, UA 0.2  0.0 - 1.0 mg/dL   Nitrite NEGATIVE  NEGATIVE   Leukocytes, UA NEGATIVE  NEGATIVE   Comment: MICROSCOPIC NOT DONE ON URINES WITH NEGATIVE PROTEIN, BLOOD, LEUKOCYTES, NITRITE, OR GLUCOSE <1000 mg/dL.   Ct Head Wo Contrast  11/29/2013   CLINICAL DATA:  Altered mental status.  EXAM: CT HEAD WITHOUT CONTRAST  TECHNIQUE: Contiguous axial images were obtained from the base of the skull through the vertex without intravenous contrast.  COMPARISON:  05/02/2013.  FINDINGS: Low density involving the gray and white matter in a right middle cerebral artery distribution. Patchy white matter low density elsewhere in both cerebral hemispheres without significant change. No significant change in diffuse enlargement of the ventricles and subarachnoid spaces. 4 mm of shift of midline structures from right to left. No evidence of transtentorial herniation. No intracranial hemorrhage. Dense arterial calcifications at the skull base. Left maxillary sinus mucosal thickening with a maximum  thickness of 5 mm. Left maxillary sinus retention cyst.  IMPRESSION: 1. 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 2. Stable atrophy and chronic small vessel white matter ischemic changes in both cerebral hemispheres. 3. Chronic left maxillary sinusitis and retention cyst without significant change.   Electronically Signed   By: Enrique Sack M.D.   On: 11/29/2013 17:17   Dg Chest Port 1 View  11/29/2013   CLINICAL DATA:  Altered mental status.  EXAM: PORTABLE CHEST - 1 VIEW  COMPARISON:  04/04/2009.  FINDINGS: Interval enlargement of the cardiac silhouette with a mild increase in prominence of the pulmonary vasculature and interstitial markings, all accentuated by a decreased inspiration and the portable AP technique. A large hiatal hernia is again demonstrated. Prominence of the superior mediastinum is not changed significantly. Bilateral shoulder degenerative changes with superior migration of both humeral heads, compatible with large bilateral chronic rotator cuff tears.  IMPRESSION: 1. Interval cardiomegaly, mild pulmonary vascular congestion and probable mild interstitial  pulmonary edema. 2. Stable large hiatal hernia. 3. 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   Electronically Signed   By: Enrique Sack M.D.   On: 11/29/2013 17:05    Labs:   Lab Results  Component Value Date   WBC 12.4* 11/29/2013   HGB 13.9 11/29/2013   HCT 41.4 11/29/2013   MCV 95.4 11/29/2013   PLT 246 11/29/2013    Recent Labs Lab 11/29/13 1531  NA 143  K 4.6  CL 107  CO2 19  BUN 24*  CREATININE 1.30  CALCIUM 9.3  PROT 7.3  BILITOT 0.7  ALKPHOS 76  ALT 18  AST 54*  GLUCOSE 114*   Lab Results  Component Value Date   CKTOTAL 1595* 11/29/2013   CKMB 1.5 04/13/2009   TROPONINI <0.30 11/29/2013    Lipid Panel     Component Value Date/Time   CHOL  Value: 162        ATP III CLASSIFICATION:  <200     mg/dL   Desirable  200-239   mg/dL   Borderline High  >=240    mg/dL   High 04/21/2008 0515   TRIG 80 04/21/2008 0515   HDL 37* 04/21/2008 0515   CHOLHDL 4.4 04/21/2008 0515   VLDL 16 04/21/2008 0515   LDLCALC  Value: 109        Total Cholesterol/HDL:CHD Risk Coronary Heart Disease Risk Table                     Men   Women  1/2 Average Risk   3.4   3.3* 04/21/2008 0515    EKG: 11/29/2013: Atrial fibrillation with rapid ventricular response, LVH, no evidence of ischemia.   Assessment/Plan 1. a). 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 b). Stable atrophy and chronic small vessel white matter ischemic changes in both cerebral hemispheres.  2.  Atrial fibrillation with rapid ventricular response. Patient states that he has had a stroke in 2009, remote history of atrial fibrillation without recurrence, I have been seeing him since 3 years ago when he was on aspirin and Plavix.  No documented atrial fibrillation since I have been seeing him on a six-month basis. 3. Dehydration. 4.  Normal lipid status in the past, on low dose of statins due to history of stroke and mild carotid plaque by carotid duplex in the past.  Recommendation: I discussed with neurology, the findings of CT scan is fairly dramatic compared to his physical examination which does not reveal significant neurologic deficits.  Differential diagnosis includes tumor mass.Dr. Alexis Goodell would like to perform MRI tomorrow to further delineate the pathology that is found on CT scan of the brain.  Also although patient has had atrial fibrillation, the stroke is focal and does not appear to be embolic.  I'll perform echocardiogram and also carotid duplex.  Patient will not be on any anticoagulation for now until cleared by neurology.  I'll inform Dr. Domenick Gong about his hospital admission.  Although I was called initially for admission for atrial fibrillation with RVR and "congestive  heart failure" clinically he is extremely dry, I will start him on IV fluids for at least 24 hours.  He'll be kept nothing by mouth as he was unable to swallow.  I'll repeat BMP in the morning.  I'll also repeat BNP.   Laverda Page, MD 11/29/2013, 10:04 PM [facility] Pager: [phone] Office: [phone] If  no answer: Cell:  [phone]

## 2013-11-29 NOTE — ED Notes (Signed)
Pt arrives EMS from home for evaluation of altered mental status, family friend states that she found pt today on the floor this morning, denies any pain. Friend states that pt behavior "abnormal," ems reports pt is alert and oriented.

## 2013-11-29 NOTE — ED Notes (Signed)
Due to pt being incontinent upon arrival. Cleaned pt up of urine and bowel movement and placed pt on chux pads with clean linen

## 2013-11-29 NOTE — ED Provider Notes (Signed)
CSN: [id number]     Arrival date & time 11/29/13  1506 History   First MD Initiated Contact with Patient 11/29/13 1519     Chief Complaint  Patient presents with  . Altered Mental Status   (Consider location/radiation/quality/duration/timing/severity/associated sxs/prior Treatment) Patient is a 80 y.o. male presenting with altered mental status. The history is provided by the patient.  Altered Mental Status  patient here complaining of increased weakness x2 days. Denies any syncope or near-syncope. States he was on the floor intentionally as he was tried to stand up. Denies any chest pain or shortness of breath. No abdominal pain. He denies recent vomiting, fever, diarrhea. Patient called his friend do to his whole-body weakness and they called the paramedics. He had a similar episode yesterday where he was on the floor for possibly 4 hours and eventually was able to help himself back up to his bed. He has baseline upper extremity weakness due to a prior fall. Symptoms have been persistent and no treatment used prior to arrival.  Past Medical History  Diagnosis Date  . Hypertension   . Hyperlipidemia   . Atrial fibrillation    History reviewed. No pertinent past surgical history. No family history on file. History  Substance Use Topics  . Smoking status: Never Smoker   . Smokeless tobacco: Not on file  . Alcohol Use: No    Review of Systems  All other systems reviewed and are negative.    Allergies  Codeine  Home Medications   Current Outpatient Rx  Name  Route  Sig  Dispense  Refill  . cholecalciferol (VITAMIN D) 1000 UNITS tablet   Oral   Take 1,000 Units by mouth daily.         . clopidogrel (PLAVIX) 75 MG tablet   Oral   Take 75 mg by mouth daily.         . ferrous sulfate 325 (65 FE) MG tablet   Oral   Take 325 mg by mouth daily with breakfast.         . lisinopril (PRINIVIL,ZESTRIL) 10 MG tablet   Oral   Take 10 mg by mouth daily.         .  metoprolol (LOPRESSOR) 50 MG tablet   Oral   Take 25 mg by mouth daily.         Vladimir Faster Glycol-Propyl Glycol (SYSTANE) 0.4-0.3 % GEL   Both Eyes   Place 1 drop into both eyes at bedtime.          . pravastatin (PRAVACHOL) 20 MG tablet   Oral   Take 20 mg by mouth at bedtime.         . vitamin B-12 (CYANOCOBALAMIN) 100 MCG tablet   Oral   Take 100 mcg by mouth daily.          . vitamin E 400 UNIT capsule   Oral   Take 400 Units by mouth daily.          BP 123/90  Pulse 152  Temp(Src) 98.9 F (37.2 C) (Rectal)  SpO2 96% Physical Exam  Nursing note and vitals reviewed. Constitutional: He is oriented to person, place, and time. He appears well-developed and well-nourished.  Non-toxic appearance. No distress.  HENT:  Head: Normocephalic and atraumatic.  Eyes: Conjunctivae, EOM and lids are normal. Pupils are equal, round, and reactive to light.  Neck: Normal range of motion. Neck supple. No tracheal deviation present. No mass present.  Cardiovascular: Normal heart sounds.  An irregularly irregular rhythm present. Tachycardia present.  Exam reveals no gallop.   No murmur heard. Pulmonary/Chest: Effort normal and breath sounds normal. No stridor. No respiratory distress. He has no decreased breath sounds. He has no wheezes. He has no rhonchi. He has no rales.  Abdominal: Soft. Normal appearance and bowel sounds are normal. He exhibits no distension. There is no tenderness. There is no rebound and no CVA tenderness.  Musculoskeletal: Normal range of motion. He exhibits no edema and no tenderness.  Neurological: He is alert and oriented to person, place, and time. He has normal strength. No cranial nerve deficit or sensory deficit. GCS eye subscore is 4. GCS verbal subscore is 5. GCS motor subscore is 6.  Skin: Skin is warm and dry. No abrasion and no rash noted.  Psychiatric: He has a normal mood and affect. His speech is normal and behavior is normal.    ED Course   Procedures (including critical care time) Labs Review Labs Reviewed - No data to display Imaging Review No results found.  EKG Interpretation    Date/Time:  Monday November 29 2013 15:16:02 EST Ventricular Rate:  151 PR Interval:    QRS Duration: 94 QT Interval:  302 QTC Calculation: 479 R Axis:   104 Text Interpretation:  Atrial fibrillation with rapid V-rate Right axis deviation Repolarization abnormality, prob rate related Artifact in lead(s) I II III aVR aVL aVF V2 Confirmed by Demarious Kapur  MD, Jt Brabec ([id number]) on 11/29/2013 3:31:06 PM            MDM  No diagnosis found.  Patient given Lopressor 2.5 mg x3 for persistent atrial fibrillation with rapid ventricular rate response. He was subsequently also given Lasix 40 mg for congestive heart failure. Patient has maintain his airway propyl he is mentating appropriately. He has no complaints of anginal chest pain at this time. Spoke with his cardiologist and patient to be admitted   CRITICAL CARE Performed by: Leota Jacobsen Total critical care time: 45 Critical care time was exclusive of separately billable procedures and treating other patients. Critical care was necessary to treat or prevent imminent or life-threatening deterioration. Critical care was time spent personally by me on the following activities: development of treatment plan with patient and/or surrogate as well as nursing, discussions with consultants, evaluation of patient's response to treatment, examination of patient, obtaining history from patient or surrogate, ordering and performing treatments and interventions, ordering and review of laboratory studies, ordering and review of radiographic studies, pulse oximetry and re-evaluation of patient's condition.   Leota Jacobsen, MD 11/29/13 [phone]

## 2013-11-30 ENCOUNTER — Inpatient Hospital Stay (HOSPITAL_COMMUNITY): Payer: Medicare Other

## 2013-11-30 DIAGNOSIS — I509 Heart failure, unspecified: Secondary | ICD-10-CM | POA: Diagnosis not present

## 2013-11-30 DIAGNOSIS — I672 Cerebral atherosclerosis: Secondary | ICD-10-CM | POA: Diagnosis not present

## 2013-11-30 DIAGNOSIS — I6350 Cerebral infarction due to unspecified occlusion or stenosis of unspecified cerebral artery: Secondary | ICD-10-CM

## 2013-11-30 DIAGNOSIS — I6789 Other cerebrovascular disease: Secondary | ICD-10-CM | POA: Diagnosis not present

## 2013-11-30 DIAGNOSIS — E860 Dehydration: Secondary | ICD-10-CM | POA: Diagnosis not present

## 2013-11-30 DIAGNOSIS — I5032 Chronic diastolic (congestive) heart failure: Secondary | ICD-10-CM | POA: Diagnosis not present

## 2013-11-30 DIAGNOSIS — I4891 Unspecified atrial fibrillation: Secondary | ICD-10-CM | POA: Diagnosis not present

## 2013-11-30 LAB — HEMOGLOBIN A1C
Hgb A1c MFr Bld: 5.7 % — ABNORMAL HIGH (ref <5.7)
Mean Plasma Glucose: 117 mg/dL — ABNORMAL HIGH (ref <117)

## 2013-11-30 LAB — LIPID PANEL
Cholesterol: 140 mg/dL (ref 0–200)
HDL: 49 mg/dL (ref >39)
LDL Cholesterol: 71 mg/dL (ref 0–99)
Total CHOL/HDL Ratio: 2.9 RATIO
Triglycerides: 98 mg/dL (ref <150)
VLDL: 20 mg/dL (ref 0–40)

## 2013-11-30 LAB — URINE CULTURE: Colony Count: 40000

## 2013-11-30 LAB — PRO B NATRIURETIC PEPTIDE: Pro B Natriuretic peptide (BNP): 1928 pg/mL — ABNORMAL HIGH (ref 0–450)

## 2013-11-30 MED ORDER — RESOURCE THICKENUP CLEAR PO POWD
ORAL | Status: DC | PRN
  Filled 2013-11-30: qty 125

## 2013-11-30 MED ORDER — ATORVASTATIN CALCIUM 20 MG PO TABS
20.0000 mg | ORAL_TABLET | Freq: Every day | ORAL | Status: DC
  Administered 2013-11-30 – 2013-12-01 (×2): 20 mg via ORAL
  Filled 2013-11-30 (×3): qty 1

## 2013-11-30 MED ORDER — SIMVASTATIN 40 MG PO TABS
40.0000 mg | ORAL_TABLET | Freq: Every day | ORAL | Status: DC
  Filled 2013-11-30: qty 1

## 2013-11-30 NOTE — Progress Notes (Signed)
Converted to NSR with pac's, EKG done , Dr Einar Gip made aware and gave order to keep cardizem at 5 ml/hr.

## 2013-11-30 NOTE — Evaluation (Signed)
Physical Therapy Evaluation Patient Details Name: [name] MRN: [id number] DOB: 1922-02-24 Today's Date: 11/30/2013 Time: 1226-1259 PT Time Calculation (min): 33 min  PT Assessment / Plan / Recommendation History of Present Illness  he fell from his bed, could not get up as he felt extremely weak in his arms.  His neighbor, who he calls her a friend, called on him to check on him today and as she did not get any reply, she went to his house and found him laying on the floor. CT brain revealed hypodensity in the right MCA distribution with 77mm midline shift and MRI brain has been requested to further evaluate that finding  Clinical Impression  Pt pleasant and quite determined that he would like to return home. 2 dgtrs present but live in [place] and will be returning this week. Pt with impaired balance, cognition and function and will benefit from acute therapy to maximize gait, balance and function to decrease burden of care and return pt to PLOF. Pt with HR up to 143 with gait in room and up to 138 sitting trying to don socks.     PT Assessment  Patient needs continued PT services    Follow Up Recommendations  CIR;Supervision/Assistance - 24 hour    Does the patient have the potential to tolerate intense rehabilitation      Barriers to Discharge Decreased caregiver support      Equipment Recommendations  Rolling walker with 5" wheels    Recommendations for Other Services Rehab consult   Frequency Min 3X/week    Precautions / Restrictions Precautions Precautions: Fall Precaution Comments: incontinent   Pertinent Vitals/Pain No pain     Mobility  Bed Mobility Overal bed mobility: Needs Assistance Bed Mobility: Supine to Sit Supine to sit: Supervision General bed mobility comments: cueing for safety and to decrease speed as pt impulsive and trying to get up before rail and lines moved Transfers Overall transfer level: Needs assistance Transfers: Sit to/from Stand Sit  to Stand: Min assist General transfer comment: cueing for hand placement and safety Ambulation/Gait Ambulation/Gait assistance: Min assist Ambulation Distance (Feet): 30 Feet Assistive device: None Gait Pattern/deviations: Narrow base of support;Staggering left;Staggering right Gait velocity interpretation: <1.8 ft/sec, indicative of risk for recurrent falls General Gait Details: pt with unsteady gait with assist to maintain balance and directional cues as pt trying to go out of room to go to the bathroom. Pt urinating throughout gait and unaware Modified Rankin (Stroke Patients Only) Pre-Morbid Rankin Score: No symptoms Modified Rankin: Moderately severe disability    Exercises     PT Diagnosis: Difficulty walking;Altered mental status  PT Problem List: Decreased cognition;Decreased activity tolerance;Decreased balance;Decreased mobility;Decreased coordination;Cardiopulmonary status limiting activity;Decreased safety awareness PT Treatment Interventions: Gait training;DME instruction;Balance training;Functional mobility training;Patient/family education;Therapeutic activities;Therapeutic exercise;Cognitive remediation     PT Goals(Current goals can be found in the care plan section) Acute Rehab PT Goals Patient Stated Goal: return home PT Goal Formulation: With patient Time For Goal Achievement: 12/14/13 Potential to Achieve Goals: Fair  Visit Information  Last PT Received On: 11/30/13 Assistance Needed: +1 History of Present Illness: he fell from his bed, could not get up as he felt extremely weak in his arms.  His neighbor, who he calls her a friend, called on him to check on him today and as she did not get any reply, she went to his house and found him laying on the floor. CT brain revealed hypodensity in the right MCA distribution with 107mm midline shift  and MRI brain has been requested to further evaluate that finding       Prior [place]  expects to be discharged to:: Private residence Living Arrangements: Alone Available Help at Discharge: Friend(s);Available PRN/intermittently Type of Home: House Home Access: Stairs to enter Entrance Stairs-Number of Steps: 4 Home Layout: Two level Alternate Level Stairs-Number of Steps: 8 Home Equipment: None Prior Function Level of Independence: Independent Communication Communication: HOH    Cognition  Cognition Arousal/Alertness: Awake/alert Behavior During Therapy: WFL for tasks assessed/performed Overall Cognitive Status: Impaired/Different from baseline Area of Impairment: Memory;Safety/judgement;Problem solving Memory: Decreased short-term memory Safety/Judgement: Decreased awareness of safety;Decreased awareness of deficits Problem Solving: Difficulty sequencing General Comments: pt with sequential cues for washing hands and forgot to turn off water, unable to recall events leading to hospitalization, unaware of incontinence with gait, unable to don socks after repeated attempts    Extremity/Trunk Assessment Upper Extremity Assessment Upper Extremity Assessment: Defer to OT evaluation (bil decreased shoulder ROM) Lower Extremity Assessment Lower Extremity Assessment: Generalized weakness Cervical / Trunk Assessment Cervical / Trunk Assessment: Normal   Balance Balance Overall balance assessment: Needs assistance Sitting balance-Leahy Scale: Good Sitting balance - Comments: pt able to reach down to feet but very taxed breathing and propping on forearm Standing balance-Leahy Scale: Fair  End of Session PT - End of Session Equipment Utilized During Treatment: Gait belt Activity Tolerance: Patient tolerated treatment well Patient left: in chair;with call bell/phone within reach;with family/visitor present Nurse Communication: Mobility status;Precautions  GP     Lanetta Inch Beth 11/30/2013, 1:09 PM Elwyn Reach, [place]

## 2013-11-30 NOTE — Procedures (Signed)
Objective Swallowing Evaluation: Modified Barium Swallowing Study  Patient Details  Name: [name] MRN: [id number] Date of Birth: 18-Sep-1922  Today's Date: 11/30/2013 Time: 1330-1400 SLP Time Calculation (min): 30 min  Past Medical History:  Past Medical History  Diagnosis Date  . Hypertension   . Hyperlipidemia   . Atrial fibrillation    Past Surgical History: History reviewed. No pertinent past surgical history. HPI:  80 y.o. male presenting with a complaint of generalized weakness.  In atrial fibrillation with RVR.  Friends complained of strange behavior.  Head CT performed and reviewed.  CT shows a hypodensity in the right MCA distribution with 47mm midline shift.  Interestingly, neurological examination is quite benign and shows no significant focality.  Would be concerned about another etiology other than stroke.  Further work up recommended.        Assessment / Plan / Recommendation Clinical Impression  Dysphagia Diagnosis: Moderate oral phase dysphagia;Moderate pharyngeal phase dysphagia;Moderate cervical esophageal phase dysphagia;Suspected primary esophageal dysphagia Clinical impression: Pt presents with a moderate to severe dysphagia that appears chronic in nature, with possible acute exacerbation.   Oral phase may impaired due to left lingual/labial weakness with observed pooling and oral residual/anterior spillage particularly on the left. Oropharyngeal phase however is impaired mainly due to decreased closure of airway durig the swallow and severe residuals left after the swallow due to hypertensive UES. CP does not sufficiently open to pass bolus unless a chin tuck in performed which both seved to increased laryngeal closure and pull open CP segment more effectively. With thin liquids the pt continues to aspirate despite chin tuck. It is only effective with nectar thick liquids. Esophageal sweep also reveals torturous appearance and stasis clearing with sips of liquids to  clear solids.   Pt likely had some chronic aspiraiton in years prior to admission. With further questioning he admits to occasional choking and choking leading to sneezing at breakfast every monring. For now pt is recommended to consume a dys 3 (mechanical soft) diet with nectar thick liquids. Will consult with pt and family regarding his wishes as there is unlikely to be significant improvement in swallow function in near future based on etiology.     Treatment Recommendation  Therapy as outlined in treatment plan below    Diet Recommendation Dysphagia 3 (Mechanical Soft);Nectar-thick liquid   Liquid Administration via: Cup Medication Administration: Crushed with puree Supervision: Full supervision/cueing for compensatory strategies Compensations: Slow rate;Small sips/bites;Multiple dry swallows after each bite/sip;Follow solids with liquid;Clear throat intermittently Postural Changes and/or Swallow Maneuvers: Chin tuck;Seated upright 90 degrees    Other  Recommendations Oral Care Recommendations: Oral care BID Other Recommendations: Order thickener from pharmacy   Follow Up Recommendations  Inpatient Rehab    Frequency and Duration min 2x/week  2 weeks   Pertinent Vitals/Pain NA    SLP Swallow Goals     General HPI: 80 y.o. male presenting with a complaint of generalized weakness.  In atrial fibrillation with RVR.  Friends complained of strange behavior.  Head CT performed and reviewed.  CT shows a hypodensity in the right MCA distribution with 25mm midline shift.  Interestingly, neurological examination is quite benign and shows no significant focality.  Would be concerned about another etiology other than stroke.  Further work up recommended.    Type of Study: Modified Barium Swallowing Study Reason for Referral: Objectively evaluate swallowing function Diet Prior to this Study: NPO Temperature Spikes Noted: No Respiratory Status: Nasal cannula History of Recent Intubation:  No  Behavior/Cognition: Alert;Cooperative;Pleasant mood Oral Cavity - Dentition: Adequate natural dentition;Missing dentition Oral Motor / Sensory Function: Impaired - see Bedside swallow eval Self-Feeding Abilities: Able to feed self Patient Positioning: Upright in chair Baseline Vocal Quality: Clear Volitional Cough: Strong Volitional Swallow: Able to elicit Anatomy:  (appearance of hypertensive UES) Pharyngeal Secretions: Not observed secondary MBS    Reason for Referral Objectively evaluate swallowing function   Oral Phase Oral Preparation/Oral Phase Oral Phase: Impaired Oral - Nectar Oral - Nectar Cup: Left pocketing in lateral sulci;Lingual/palatal residue;Left anterior bolus loss Oral - Nectar Straw: Left pocketing in lateral sulci;Lingual/palatal residue Oral - Thin Oral - Thin Cup: Left pocketing in lateral sulci;Lingual/palatal residue Oral - Solids Oral - Puree: Left pocketing in lateral sulci;Lingual/palatal residue Oral - Mechanical Soft: Left pocketing in lateral sulci;Lingual/palatal residue   Pharyngeal Phase Pharyngeal Phase Pharyngeal Phase: Impaired Pharyngeal - Nectar Pharyngeal - Nectar Cup: Reduced airway/laryngeal closure;Reduced laryngeal elevation;Reduced anterior laryngeal mobility;Penetration/Aspiration during swallow;Penetration/Aspiration after swallow;Trace aspiration;Pharyngeal residue - valleculae;Pharyngeal residue - pyriform sinuses (UES opening improved with Chin tuck, reduced residuals) Penetration/Aspiration details (nectar cup): Material enters airway, passes BELOW cords then ejected out Pharyngeal - Nectar Straw: Reduced airway/laryngeal closure;Reduced laryngeal elevation;Reduced anterior laryngeal mobility;Pharyngeal residue - valleculae;Pharyngeal residue - pyriform sinuses (improved with chin tuck) Penetration/Aspiration details (nectar straw): Material enters airway, remains ABOVE vocal cords then ejected out;Material does not enter  airway Pharyngeal - Thin Pharyngeal - Thin Cup: Reduced airway/laryngeal closure;Reduced laryngeal elevation;Reduced anterior laryngeal mobility;Penetration/Aspiration during swallow;Penetration/Aspiration after swallow;Pharyngeal residue - valleculae;Pharyngeal residue - pyriform sinuses;Significant aspiration (Amount);Compensatory strategies attempted (Comment) (chin tuck worsed aspiration) Penetration/Aspiration details (thin cup): Material enters airway, passes BELOW cords and not ejected out despite cough attempt by patient Pharyngeal - Solids Pharyngeal - Puree: Reduced laryngeal elevation;Reduced anterior laryngeal mobility;Pharyngeal residue - valleculae;Pharyngeal residue - pyriform sinuses Pharyngeal - Mechanical Soft: Pharyngeal residue - valleculae;Pharyngeal residue - pyriform sinuses;Reduced laryngeal elevation;Reduced airway/laryngeal closure;Reduced anterior laryngeal mobility Pharyngeal - Pill: Pharyngeal residue - cp segment (Pill loged at UES)  Cervical Esophageal Phase    GO    Cervical Esophageal Phase Cervical Esophageal Phase: Impaired Cervical Esophageal Phase - Comment Cervical Esophageal Comment: Apperance of limited opening of UES with moderate residuals pooling in pyriforms. Pill lodged at UES, required several boluses to transit (no pt awareness). Chin tuck aided in opening of UES        [facility], [place] CCC-SLP [phone]  [name] 11/30/2013, 2:31 PM

## 2013-11-30 NOTE — Progress Notes (Signed)
Speech Language Pathology Treatment: Dysphagia  Patient Details Name: [name] MRN: [id number] DOB: 03-21-1922 Today's Date: 11/30/2013 Time: 1430-1440 SLP Time Calculation (min): 10 min  Assessment / Plan / Recommendation Clinical Impression  Following MBS pt asked for water. SLP provided visual and verbal cueing for pt to follow recommended strategies. During this treatment pt continued to have coughing indicative of penetration despite strategies. Pt also noted to have increased oral residue than seen during MBS. Further encouraged pt to complete second or third swallow to clear oral residuals. Use of a straw with small sips may facilitate chin tuck posture and reduce oral residuals. Will practice with pt at next session as he adamantly refused straw today.    HPI HPI: 80 y.o. male presenting with a complaint of generalized weakness.  In atrial fibrillation with RVR.  Friends complained of strange behavior.  Head CT performed and reviewed.  CT shows a hypodensity in the right MCA distribution with 52mm midline shift.  Interestingly, neurological examination is quite benign and shows no significant focality.  Would be concerned about another etiology other than stroke.  Further work up recommended.      Pertinent Vitals NA  SLP Plan  Continue with current plan of care    Recommendations Diet recommendations: Dysphagia 3 (mechanical soft);Nectar-thick liquid Liquids provided via: Cup Medication Administration: Crushed with puree Supervision: Full supervision/cueing for compensatory strategies Compensations: Slow rate;Small sips/bites;Multiple dry swallows after each bite/sip;Follow solids with liquid;Clear throat intermittently Postural Changes and/or Swallow Maneuvers: Chin tuck;Seated upright 90 degrees              Oral Care Recommendations: Oral care BID;Oral care before and after PO (clear mouth after PO) Follow up Recommendations: Inpatient Rehab Plan: Continue with current  plan of care    GO    [facility], [place] CCC-SLP [phone]  [name] 11/30/2013, 2:36 PM

## 2013-11-30 NOTE — Care Management Note (Addendum)
    Page 1 of 1   12/01/2013     10:53:06 AM   CARE MANAGEMENT NOTE 12/01/2013  Patient:  [name], [name]   Account Number:  [id number]  Date Initiated:  11/30/2013  Documentation initiated by:  Elissa Hefty  Subjective/Objective Assessment:   adm w at fib w rvr     Action/Plan:   lives alone, supp neighbor   Anticipated DC Date:     Anticipated DC Plan:    In-house referral  Clinical Social Worker      DC Forensic scientist  CM consult      PAC Choice  IP REHAB   Choice offered to / List presented to:             Status of service:   Medicare Important Message given?   (If response is "NO", the following Medicare IM given date fields will be blank) Date Medicare IM given:   Date Additional Medicare IM given:    Discharge Disposition:    Per UR Regulation:  Reviewed for med. necessity/level of care/duration of stay  If discussed at [place] of Stay Meetings, dates discussed:    Comments:  2/4 [place] rn,bsn spoke w jeannie w inpt rehab. they feel pt may be cir candidate. order obtained. sw for back up in case no beds or not accepted.

## 2013-11-30 NOTE — Progress Notes (Signed)
transported to the [facility] for barium swallow and mri of the head. And back after 2 hours without any incidence.

## 2013-11-30 NOTE — Progress Notes (Signed)
PT Cancellation Note  Patient Details Name: [name] MRN: [id number] DOB: 21-Nov-1921   Cancelled Treatment:    Reason Eval/Treat Not Completed: Other (comment) (pt currently on bedrest and await increased activity)   Melford Aase 11/30/2013, 7:21 AM Elwyn Reach, [place]

## 2013-11-30 NOTE — Progress Notes (Signed)
VASCULAR LAB PRELIMINARY  PRELIMINARY  PRELIMINARY  PRELIMINARY  Carotid duplex  completed.    Preliminary report:  Bilateral:  1-39% ICA stenosis.  Vertebral artery flow is antegrade.      Kolyn Rozario, RVT 11/30/2013, 10:50 AM

## 2013-11-30 NOTE — Evaluation (Addendum)
Clinical/Bedside Swallow Evaluation Patient Details  Name: [name] MRN: [id number] Date of Birth: [birth year]  Today's Date: 11/30/2013 Time: 0930-0950 SLP Time Calculation (min): 20 min  Past Medical History:  Past Medical History  Diagnosis Date  . Hypertension   . Hyperlipidemia   . Atrial fibrillation    Past Surgical History: History reviewed. No pertinent past surgical history. HPI:  80 y.o. male presenting with a complaint of generalized weakness.  In atrial fibrillation with RVR.  Friends complained of strange behavior.  Head CT performed and reviewed.  CT shows a hypodensity in the right MCA distribution with 89mm midline shift.  Interestingly, neurological examination is quite benign and shows no significant focality.  Would be concerned about another etiology other than stroke.  Further work up recommended.      Assessment / Plan / Recommendation Clinical Impression  Pt demonstrates overt evidence of aspiration with each sip of water given. Evidence is persistent. Also question left labial or lingual weakness. Pt had anterior spillage from mouth on left with small cup sips. Plan for MBS to determine if POs are safe. Discussed with MD    Aspiration Risk  Moderate    Diet Recommendation NPO        Other  Recommendations Recommended Consults: MBS Oral Care Recommendations: Oral care Q4 per protocol   Follow Up Recommendations       Frequency and Duration        Pertinent Vitals/Pain NA    SLP Swallow Goals     Swallow Study Prior Functional Status  Type of Home: House Available Help at Discharge: Friend(s);Available PRN/intermittently    General HPI: 80 y.o. male presenting with a complaint of generalized weakness.  In atrial fibrillation with RVR.  Friends complained of strange behavior.  Head CT performed and reviewed.  CT shows a hypodensity in the right MCA distribution with 101mm midline shift.  Interestingly, neurological examination is quite benign  and shows no significant focality.  Would be concerned about another etiology other than stroke.  Further work up recommended.    Type of Study: Bedside swallow evaluation Diet Prior to this Study: NPO Temperature Spikes Noted: No Respiratory Status: Nasal cannula History of Recent Intubation: No Behavior/Cognition: Alert;Cooperative;Pleasant mood (labile) Oral Cavity - Dentition: Adequate natural dentition;Missing dentition Self-Feeding Abilities: Able to feed self;Needs assist Patient Positioning: Upright in bed Baseline Vocal Quality: Clear Volitional Cough: Strong Volitional Swallow: Able to elicit    Oral/Motor/Sensory Function Overall Oral Motor/Sensory Function: Impaired Labial ROM: Within Functional Limits Labial Symmetry: Abnormal symmetry left (questionable???) Labial Strength: Reduced Labial Sensation: Within Functional Limits Lingual ROM: Reduced left (questionable ??) Lingual Symmetry: Within Functional Limits Lingual Strength: Within Functional Limits Lingual Sensation: Within Functional Limits Facial ROM: Within Functional Limits Facial Symmetry: Within Functional Limits Facial Strength: Within Functional Limits Facial Sensation: Within Functional Limits Velum: Within Functional Limits Mandible: Within Functional Limits   Ice Chips Ice chips: Not tested   Thin Liquid Thin Liquid: Impaired Presentation: Cup;Straw;Self Fed Oral Phase Impairments: Reduced labial seal Oral Phase Functional Implications: Left anterior spillage Pharyngeal  Phase Impairments: Suspected delayed Swallow;Cough - Immediate;Wet Vocal Quality    Nectar Thick Nectar Thick Liquid: Not tested   Honey Thick Honey Thick Liquid: Not tested   Puree Puree: Impaired Presentation: Spoon Pharyngeal Phase Impairments: Cough - Delayed   Solid   GO    Solid: Not tested      Bryan [place], [place] CCC-SLP [phone]  [name] 11/30/2013,1:07 PM

## 2013-11-30 NOTE — Progress Notes (Signed)
Subjective:  Patient is much more alert, his 2 daughters are at the bedside.  Denies any chest pain or shortness of breath.  Wants to drink water.  No palpitations.  Objective:  Vital Signs in the last 24 hours: Temp:  [97.1 F (36.2 C)-98.5 F (36.9 C)] 98.2 F (36.8 C) (02/03 1600) Pulse Rate:  [50-161] 106 (02/03 1600) Resp:  [14-27] 15 (02/03 1800) BP: (82-135)/(39-99) 108/46 mmHg (02/03 1800) SpO2:  [92 %-98 %] 98 % (02/03 1600) Weight:  [66 kg (145 lb 8.1 oz)-66.4 kg (146 lb 6.2 oz)] 66 kg (145 lb 8.1 oz) (02/03 0400)  Intake/Output from previous day: 02/02 0701 - 02/03 0700 In: 2617.9 [I.V.:2617.9] Out: 1070 [Urine:1070]  Physical Exam: General appearance: alert, cooperative, appears stated age, no distress and appears dehydrated.  Eyes: negative findings: lids and lashes normal  Neck: no adenopathy, no carotid bruit, no JVD, supple, symmetrical, trachea midline and thyroid not enlarged, symmetric, no tenderness/mass/nodules  Neck: JVP - normal, carotids 2+= without bruits  Resp: clear to auscultation bilaterally  Chest wall: no tenderness  Cardio: Tachycardia present, S1 variable with normal S2. II/VI SEM in apex and II/VI EDM in the right sternal border.  GI: soft, non-tender; bowel sounds normal; no masses, no organomegaly  Extremities: extremities normal, atraumatic, no cyanosis or edema  Pulses: 2+ and symmetric  Neurologic: Grossly normal    Lab Results:  Recent Labs  11/29/13 1531  WBC 12.4*  HGB 13.9  PLT 246    Recent Labs  11/29/13 1531  NA 143  K 4.6  CL 107  CO2 19  GLUCOSE 114*  BUN 24*  CREATININE 1.30    Recent Labs  11/29/13 1531  TROPONINI <0.30   Hepatic Function Panel  Recent Labs  11/29/13 1531  PROT 7.3  ALBUMIN 3.5  AST 54*  ALT 18  ALKPHOS 76  BILITOT 0.7    Recent Labs  11/30/13 0245  CHOL 140   No results found for this basename: PROTIME,  in the last 72 hours Lipid Panel     Component Value Date/Time    CHOL 140 11/30/2013 0245   TRIG 98 11/30/2013 0245   HDL 49 11/30/2013 0245   CHOLHDL 2.9 11/30/2013 0245   VLDL 20 11/30/2013 0245   LDLCALC 71 11/30/2013 0245     Assessment/Plan:  1.  Atrial fibrillation with rapid ventricular response 2.  Stroke, no gross motor deficits, however agent has been having difficulty in swallowing.  Swallowing evaluation has been ordered.  Neurology is on board. 3.  Hypertension  Recommendation: Patient is presently doing well and we cannot resume any by mouth medications.  Hence I will continue IV Cardizem for now.  He'll also continue with IV beta blocker.  I'll follow the recommendation of the neurology.  I'll await for them to see when I can start him on anticoagulation given his high risk for recurrence of stroke.  I met with the daughters and explained the long-term prognosis.   Laverda Page, M.D. 11/30/2013, 7:07 PM [facility] Pager: [phone] Office: [phone] If no answer: [phone]

## 2013-11-30 NOTE — Progress Notes (Signed)
Rehab Admissions Coordinator Note:  Patient was screened by Retta Diones for appropriateness for an Inpatient Acute Rehab Consult.  At this time, we are recommending Inpatient Rehab consult.  Jodell Cipro M 11/30/2013, 5:01 PM  I can be reached at [phone].

## 2013-11-30 NOTE — Progress Notes (Signed)
Echo Lab  2D Echocardiogram completed.  [place], RDCS 11/30/2013 9:42 AM

## 2013-11-30 NOTE — Progress Notes (Signed)
NEURO HOSPITALIST PROGRESS NOTE   SUBJECTIVE:                                                                                                                        Mr. [name] has no neurological complains this morning. He said that he will feel much better if we allow him to go home today. CT brain yesterday revealed hypodensity in the right MCA distribution with 17mm midline shift and MRI brain has been requested to further evaluate that finding. CUS showed no hemodynamically significant carotid disease.  TTE completed, results pending. On aspirin.  OBJECTIVE:                                                                                                                           Vital signs in last 24 hours: Temp:  [97.1 F (36.2 C)-98.9 F (37.2 C)] 98.5 F (36.9 C) (02/03 0755) Pulse Rate:  [43-161] 99 (02/03 1000) Resp:  [13-27] 24 (02/03 1000) BP: (82-135)/(39-105) 135/49 mmHg (02/03 1000) SpO2:  [92 %-98 %] 96 % (02/03 1000) Weight:  [66 kg (145 lb 8.1 oz)-66.4 kg (146 lb 6.2 oz)] 66 kg (145 lb 8.1 oz) (02/03 0400)  Intake/Output from previous day: 02/02 0701 - 02/03 0700 In: 2617.9 [I.V.:2617.9] Out: 3976 [Urine:1070] Intake/Output this shift: Total I/O In: 110 [I.V.:110] Out: -  Nutritional status: NPO  Past Medical History  Diagnosis Date  . Hypertension   . Hyperlipidemia   . Atrial fibrillation      Neurologic Exam:  Mental Status:  Alert, oriented. Speech fluent without evidence of aphasia. Able to follow 3 step commands without difficulty.  Cranial Nerves:  II: Discs flat bilaterally; Visual fields grossly normal, pupils equal, round, reactive to light and accommodation  III,IV, VI: ptosis not present, extra-ocular motions intact bilaterally  V,VII: left facial droop, facial light touch sensation normal bilaterally  VIII: hearing normal bilaterally  IX,X: gag reflex present  XI: bilateral shoulder shrug  XII:  midline tongue extension  Motor:  Right : Upper extremity 5/5 Left: Upper extremity Limited range of motion with full strength within range  Lower extremity 5/5 Lower extremity 5/5  Tone and bulk:normal tone throughout; no atrophy noted  Sensory: Pinprick and light touch  intact throughout, bilaterally  Deep Tendon Reflexes: 1+ in the upper extremity and absent in the lower extremities  Plantars:  Right: downgoing Left: downgoing  Cerebellar:  normal finger-to-nose and normal heel-to-shin test  Gait: Unable to test   Lab Results: Lab Results  Component Value Date/Time   CHOL 140 11/30/2013  2:45 AM   Lipid Panel  Recent Labs  11/30/13 0245  CHOL 140  TRIG 98  HDL 49  CHOLHDL 2.9  VLDL 20  LDLCALC 71    Studies/Results: Ct Head Wo Contrast  11/29/2013   CLINICAL DATA:  Altered mental status.  EXAM: CT HEAD WITHOUT CONTRAST  TECHNIQUE: Contiguous axial images were obtained from the base of the skull through the vertex without intravenous contrast.  COMPARISON:  05/02/2013.  FINDINGS: Low density involving the gray and white matter in a right middle cerebral artery distribution. Patchy white matter low density elsewhere in both cerebral hemispheres without significant change. No significant change in diffuse enlargement of the ventricles and subarachnoid spaces. 4 mm of shift of midline structures from right to left. No evidence of transtentorial herniation. No intracranial hemorrhage. Dense arterial calcifications at the skull base. Left maxillary sinus mucosal thickening with a maximum thickness of 5 mm. Left maxillary sinus retention cyst.  IMPRESSION: 1. 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 2. Stable atrophy and chronic small vessel white matter ischemic changes in both cerebral hemispheres. 3. Chronic left maxillary sinusitis and retention cyst without significant change.   Electronically Signed    By: Enrique Sack M.D.   On: 11/29/2013 17:17   Dg Chest Port 1 View  11/29/2013   CLINICAL DATA:  Altered mental status.  EXAM: PORTABLE CHEST - 1 VIEW  COMPARISON:  04/04/2009.  FINDINGS: Interval enlargement of the cardiac silhouette with a mild increase in prominence of the pulmonary vasculature and interstitial markings, all accentuated by a decreased inspiration and the portable AP technique. A large hiatal hernia is again demonstrated. Prominence of the superior mediastinum is not changed significantly. Bilateral shoulder degenerative changes with superior migration of both humeral heads, compatible with large bilateral chronic rotator cuff tears.  IMPRESSION: 1. Interval cardiomegaly, mild pulmonary vascular congestion and probable mild interstitial pulmonary edema. 2. Stable large hiatal hernia. 3. 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   Electronically Signed   By: Enrique Sack M.D.   On: 11/29/2013 17:05    MEDICATIONS                                                                                                                       I have reviewed the patient's current medications.  ASSESSMENT/PLAN:  80 y.o. male presenting with a complaint of generalized weakness. Strange behavior reported by friends. In atrial fibrillation with RVR upon arrival to the ED.CT shows a hypodensity in the right MCA distribution with 44mm midline shift. However, patient's neurological status is virtually intact at this moment. Awaiting MRI brain. Will follow up.  Dorian Pod, MD [facility] [phone]  11/30/2013, 10:52 AM

## 2013-12-01 DIAGNOSIS — I509 Heart failure, unspecified: Secondary | ICD-10-CM | POA: Diagnosis not present

## 2013-12-01 DIAGNOSIS — I6789 Other cerebrovascular disease: Secondary | ICD-10-CM | POA: Diagnosis not present

## 2013-12-01 DIAGNOSIS — I6350 Cerebral infarction due to unspecified occlusion or stenosis of unspecified cerebral artery: Secondary | ICD-10-CM | POA: Diagnosis not present

## 2013-12-01 DIAGNOSIS — I4891 Unspecified atrial fibrillation: Secondary | ICD-10-CM | POA: Diagnosis not present

## 2013-12-01 MED ORDER — DILTIAZEM HCL ER COATED BEADS 180 MG PO CP24
180.0000 mg | ORAL_CAPSULE | Freq: Every day | ORAL | Status: DC
  Administered 2013-12-01 – 2013-12-02 (×2): 180 mg via ORAL
  Filled 2013-12-01 (×2): qty 1

## 2013-12-01 MED ORDER — METOPROLOL TARTRATE 25 MG PO TABS
25.0000 mg | ORAL_TABLET | Freq: Two times a day (BID) | ORAL | Status: DC
  Administered 2013-12-01 – 2013-12-02 (×3): 25 mg via ORAL
  Filled 2013-12-01 (×4): qty 1

## 2013-12-01 MED ORDER — SODIUM CHLORIDE 0.9 % IV SOLN
INTRAVENOUS | Status: DC
  Administered 2013-12-01: via INTRAVENOUS

## 2013-12-01 MED ORDER — AMIODARONE HCL 200 MG PO TABS
400.0000 mg | ORAL_TABLET | Freq: Two times a day (BID) | ORAL | Status: DC
  Administered 2013-12-01 – 2013-12-02 (×3): 400 mg via ORAL
  Filled 2013-12-01 (×4): qty 2

## 2013-12-01 MED ORDER — APIXABAN 2.5 MG PO TABS
2.5000 mg | ORAL_TABLET | Freq: Two times a day (BID) | ORAL | Status: DC
  Administered 2013-12-01 – 2013-12-02 (×2): 2.5 mg via ORAL
  Filled 2013-12-01 (×3): qty 1

## 2013-12-01 NOTE — Consult Note (Signed)
Physical Medicine and Rehabilitation Consult Reason for Consult: CVA Referring Physician: Dr. Einar Gip   HPI: [name] is a 80 y.o. right-handed male with history of hypertension, atrial fibrillation as well as CVA 2009 with little residual. Patient independent living alone and driving prior to admission. Admitted 11/29/2013 with generalized weakness x2 days after being found in the floor by a friend. MRI of the brain showed large posterior and inferior right MCA division infarct without hemorrhage. MRA of the brain with focal high grade stenosis of the posterior inferior right M2 division. Carotid Dopplers with no ICA stenosis. Echocardiogram with ejection fraction of 55% with normal systolic function. Patient did not receive TPA. Neurology services consulted maintained on aspirin for CVA prophylaxis. Followup cardiology services for atrial fibrillation with rapid ventricular response maintained on Cardizem and metoprolol. Await plan for possible Coumadin therapy given PAF. Currently on dysphagia 3 nectar thick liquid diet. Physical therapy evaluation completed 3 2015 with recommendations for physical medicine rehabilitation consult to consider inpatient rehabilitation services.   Review of Systems  Cardiovascular: Positive for palpitations.  Musculoskeletal: Positive for falls and myalgias.  All other systems reviewed and are negative.   Past Medical History  Diagnosis Date  . Hypertension   . Hyperlipidemia   . Atrial fibrillation    History reviewed. No pertinent past surgical history. No family history on file. Social History:  reports that he has never smoked. He does not have any smokeless tobacco history on file. He reports that he does not drink alcohol or use illicit drugs. Allergies:  Allergies  Allergen Reactions  . Codeine Nausea And Vomiting   Medications Prior to Admission  Medication Sig Dispense Refill  . cholecalciferol (VITAMIN D) 1000 UNITS tablet Take  1,000 Units by mouth daily.      . clopidogrel (PLAVIX) 75 MG tablet Take 75 mg by mouth daily.      . ferrous sulfate 325 (65 FE) MG tablet Take 325 mg by mouth daily with breakfast.      . lisinopril (PRINIVIL,ZESTRIL) 10 MG tablet Take 10 mg by mouth daily.      . metoprolol (LOPRESSOR) 50 MG tablet Take 25 mg by mouth daily.      Vladimir Faster Glycol-Propyl Glycol (SYSTANE) 0.4-0.3 % GEL Place 1 drop into both eyes at bedtime.       . pravastatin (PRAVACHOL) 20 MG tablet Take 20 mg by mouth at bedtime.      . vitamin B-12 (CYANOCOBALAMIN) 100 MCG tablet Take 100 mcg by mouth daily.       . vitamin E 400 UNIT capsule Take 400 Units by mouth daily.        Home: Home Living Family/patient expects to be discharged to:: Private residence Living Arrangements: Alone Available Help at Discharge: Friend(s);Available PRN/intermittently Type of Home: House Home Access: Stairs to enter Entrance Stairs-Number of Steps: 4 Home Layout: Two level Alternate Level Stairs-Number of Steps: 8 Home Equipment: None  Functional History:   Functional Status:  Mobility:     Ambulation/Gait Ambulation Distance (Feet): 30 Feet General Gait Details: pt with unsteady gait with assist to maintain balance and directional cues as pt trying to go out of room to go to the bathroom. Pt urinating throughout gait and unaware    ADL:    Cognition: Cognition Overall Cognitive Status: Impaired/Different from baseline Orientation Level: Oriented X4;Oriented to place;Oriented to person;Oriented to situation;Disoriented to time Cognition Arousal/Alertness: Awake/alert Behavior During Therapy: WFL for tasks assessed/performed Overall  Cognitive Status: Impaired/Different from baseline Area of Impairment: Memory;Safety/judgement;Problem solving Memory: Decreased short-term memory Safety/Judgement: Decreased awareness of safety;Decreased awareness of deficits Problem Solving: Difficulty sequencing General  Comments: pt with sequential cues for washing hands and forgot to turn off water, unable to recall events leading to hospitalization, unaware of incontinence with gait, unable to don socks after repeated attempts  Blood pressure 104/59, pulse 72, temperature 97.4 F (36.3 C), temperature source Oral, resp. rate 22, height 5\' 5"  (1.651 m), weight 66 kg (145 lb 8.1 oz), SpO2 98.00%. Physical Exam  Vitals reviewed. Constitutional: He is oriented to person, place, and time. He appears well-developed.  HENT:  Head: Normocephalic.  Eyes: EOM are normal.  Neck: Normal range of motion. Neck supple. No thyromegaly present.  Cardiovascular:  Cardiac rate controlled  Respiratory: Effort normal and breath sounds normal. No respiratory distress.  GI: Soft. Bowel sounds are normal. He exhibits no distension.  Neurological: He is alert and oriented to person, place, and time.  Speech is a bit hoarse but fully intelligible. He follows full commands. Mild left central 7 and tongue deviation. Attends well to the left. [place] decreased left. No limb ataxia. No pronator drift. RUE 5/5. LUE 4 to 4+/5. RLE 4/5 prox to distal. LLE 4-/5 prox to distal. No obvious sensory loss. DTR's 1+. Cognition generally appropriate. A little impulsive.  Skin: Skin is warm and dry.  Psychiatric:  Pleasant and in good spirits.     No results found for this or any previous visit (from the past 24 hour(s)). Ct Head Wo Contrast  11/29/2013   CLINICAL DATA:  Altered mental status.  EXAM: CT HEAD WITHOUT CONTRAST  TECHNIQUE: Contiguous axial images were obtained from the base of the skull through the vertex without intravenous contrast.  COMPARISON:  05/02/2013.  FINDINGS: Low density involving the gray and white matter in a right middle cerebral artery distribution. Patchy white matter low density elsewhere in both cerebral hemispheres without significant change. No significant change in diffuse enlargement of the ventricles and  subarachnoid spaces. 4 mm of shift of midline structures from right to left. No evidence of transtentorial herniation. No intracranial hemorrhage. Dense arterial calcifications at the skull base. Left maxillary sinus mucosal thickening with a maximum thickness of 5 mm. Left maxillary sinus retention cyst.  IMPRESSION: 1. 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 2. Stable atrophy and chronic small vessel white matter ischemic changes in both cerebral hemispheres. 3. Chronic left maxillary sinusitis and retention cyst without significant change.   Electronically Signed   By: Enrique Sack M.D.   On: 11/29/2013 17:17   Mr Jodene Nam Head Wo Contrast  11/30/2013   CLINICAL DATA:  Weakness. The CT scan suggests a right MCA territory infarct. The patient's neurologic exam is nonfocal.  EXAM: MRI HEAD WITHOUT CONTRAST  MRA HEAD WITHOUT CONTRAST  TECHNIQUE: Multiplanar, multiecho pulse sequences of the brain and surrounding structures were obtained without intravenous contrast. Angiographic images of the head were obtained using MRA technique without contrast.  COMPARISON:  CT head without contrast 2/2/ 15.  FINDINGS: MRI HEAD FINDINGS  The diffusion-weighted images confirm an acute nonhemorrhagic right MCA territory infarct with sparing of the basal ganglia. Much of the primary motor cortex is also spared. Infarct predominantly involves the right temporal lobe and inferior parietal lobe. The right insular cortex is involved.  Extensive T2 signal abnormality corresponds with the areas of infarction. There is effacement  of sulci and swelling of the cortex. Midline shift of 4 mm is again seen. Atrophy and moderate periventricular white matter signal abnormality is present in addition to the infarcted territory is.  Flow is present in the major intracranial arteries. The patient is status post bilateral lens extractions. Mucosal thickening is present  in the left maxillary sinus, particularly along the anterior aspect. There is some fluid in the left mastoid air cells.  MRA HEAD FINDINGS  The internal carotid arteries are within normal limits from the high cervical segments through the ICA termini. The A1 and M1 segments are normal. The anterior communicating artery is patent. The A1 and M1 segments are normal. A high-grade stenosis is present within the inferior right M2 division. The left MCA bifurcation is intact. The small vessels are unremarkable.  The left vertebral artery is slightly dominant to the right. The PICA origins are visualized and normal bilaterally. There is slight irregularity within the basilar artery without focal stenosis. The posterior cerebral arteries both originate from the basilar tip. A small right posterior communicating artery is patent. The PCA branch vessels are intact.  IMPRESSION: 1. Large posterior and inferior right MCA division infarct without evidence for hemorrhage. Much of the primary motor cortex is spared. 2. Atrophy and moderate white matter disease is present bilaterally, likely chronic. 3. Focal high-grade stenosis of the posterior inferior right M2 division.   Electronically Signed   By: Lawrence Santiago M.D.   On: 11/30/2013 15:34   Mr Brain Wo Contrast  11/30/2013   CLINICAL DATA:  Weakness. The CT scan suggests a right MCA territory infarct. The patient's neurologic exam is nonfocal.  EXAM: MRI HEAD WITHOUT CONTRAST  MRA HEAD WITHOUT CONTRAST  TECHNIQUE: Multiplanar, multiecho pulse sequences of the brain and surrounding structures were obtained without intravenous contrast. Angiographic images of the head were obtained using MRA technique without contrast.  COMPARISON:  CT head without contrast 2/2/ 15.  FINDINGS: MRI HEAD FINDINGS  The diffusion-weighted images confirm an acute nonhemorrhagic right MCA territory infarct with sparing of the basal ganglia. Much of the primary motor cortex is also spared. Infarct  predominantly involves the right temporal lobe and inferior parietal lobe. The right insular cortex is involved.  Extensive T2 signal abnormality corresponds with the areas of infarction. There is effacement of sulci and swelling of the cortex. Midline shift of 4 mm is again seen. Atrophy and moderate periventricular white matter signal abnormality is present in addition to the infarcted territory is.  Flow is present in the major intracranial arteries. The patient is status post bilateral lens extractions. Mucosal thickening is present in the left maxillary sinus, particularly along the anterior aspect. There is some fluid in the left mastoid air cells.  MRA HEAD FINDINGS  The internal carotid arteries are within normal limits from the high cervical segments through the ICA termini. The A1 and M1 segments are normal. The anterior communicating artery is patent. The A1 and M1 segments are normal. A high-grade stenosis is present within the inferior right M2 division. The left MCA bifurcation is intact. The small vessels are unremarkable.  The left vertebral artery is slightly dominant to the right. The PICA origins are visualized and normal bilaterally. There is slight irregularity within the basilar artery without focal stenosis. The posterior cerebral arteries both originate from the basilar tip. A small right posterior communicating artery is patent. The PCA branch vessels are intact.  IMPRESSION: 1. Large posterior and inferior right MCA division infarct without evidence  for hemorrhage. Much of the primary motor cortex is spared. 2. Atrophy and moderate white matter disease is present bilaterally, likely chronic. 3. Focal high-grade stenosis of the posterior inferior right M2 division.   Electronically Signed   By: Lawrence Santiago M.D.   On: 11/30/2013 15:34   Dg Chest Port 1 View  11/29/2013   CLINICAL DATA:  Altered mental status.  EXAM: PORTABLE CHEST - 1 VIEW  COMPARISON:  04/04/2009.  FINDINGS: Interval  enlargement of the cardiac silhouette with a mild increase in prominence of the pulmonary vasculature and interstitial markings, all accentuated by a decreased inspiration and the portable AP technique. A large hiatal hernia is again demonstrated. Prominence of the superior mediastinum is not changed significantly. Bilateral shoulder degenerative changes with superior migration of both humeral heads, compatible with large bilateral chronic rotator cuff tears.  IMPRESSION: 1. Interval cardiomegaly, mild pulmonary vascular congestion and probable mild interstitial pulmonary edema. 2. Stable large hiatal hernia. 3. 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   Electronically Signed   By: Enrique Sack M.D.   On: 11/29/2013 17:05   Dg Swallowing Func-speech Pathology  11/30/2013   Katherene Ponto [place], CCC-SLP     11/30/2013  2:33 PM Objective Swallowing Evaluation: Modified Barium Swallowing Study   Patient Details  Name: [name] MRN: [id number] Date of Birth: 1922-09-17  Today's Date: 11/30/2013 Time: 1330-1400 SLP Time Calculation (min): 30 min  Past Medical History:  Past Medical History  Diagnosis Date  . Hypertension   . Hyperlipidemia   . Atrial fibrillation    Past Surgical History: History reviewed. No pertinent past  surgical history. HPI:  80 y.o. male presenting with a complaint of generalized weakness.   In atrial fibrillation with RVR.  Friends complained of strange  behavior.  Head CT performed and reviewed.  CT shows a  hypodensity in the right MCA distribution with 22mm midline shift.   Interestingly, neurological examination is quite benign and  shows no significant focality.  Would be concerned about another  etiology other than stroke.  Further work up recommended.        Assessment / Plan / Recommendation Clinical Impression  Dysphagia Diagnosis: Moderate oral phase dysphagia;Moderate  pharyngeal phase dysphagia;Moderate cervical esophageal phase  dysphagia;Suspected primary  esophageal dysphagia Clinical impression: Pt presents with a moderate to severe  dysphagia that appears chronic in nature, with possible acute  exacerbation.   Oral phase may impaired due to left lingual/labial weakness with  observed pooling and oral residual/anterior spillage particularly  on the left. Oropharyngeal phase however is impaired mainly due  to decreased closure of airway durig the swallow and severe  residuals left after the swallow due to hypertensive UES. CP does  not sufficiently open to pass bolus unless a chin tuck in  performed which both seved to increased laryngeal closure and  pull open CP segment more effectively. With thin liquids the pt  continues to aspirate despite chin tuck. It is only effective  with nectar thick liquids. Esophageal sweep also reveals  torturous appearance and stasis clearing with sips of liquids to  clear solids.   Pt likely had some chronic aspiraiton in years prior to  admission. With further questioning he admits to occasional  choking and choking leading to sneezing at breakfast every  monring. For now pt is recommended to consume a dys 3 (mechanical  soft) diet with nectar thick liquids. Will consult with pt and  family regarding his wishes as there is  unlikely to be  significant improvement in swallow function in near future based  on etiology.     Treatment Recommendation  Therapy as outlined in treatment plan below    Diet Recommendation Dysphagia 3 (Mechanical Soft);Nectar-thick  liquid   Liquid Administration via: Cup Medication Administration: Crushed with puree Supervision: Full supervision/cueing for compensatory strategies Compensations: Slow rate;Small sips/bites;Multiple dry swallows  after each bite/sip;Follow solids with liquid;Clear throat  intermittently Postural Changes and/or Swallow Maneuvers: Chin tuck;Seated  upright 90 degrees    Other  Recommendations Oral Care Recommendations: Oral care BID Other Recommendations: Order thickener from  pharmacy   Follow Up Recommendations  Inpatient Rehab    Frequency and Duration min 2x/week  2 weeks   Pertinent Vitals/Pain NA    SLP Swallow Goals     General HPI: 80 y.o. male presenting with a complaint of  generalized weakness.  In atrial fibrillation with RVR.  Friends  complained of strange behavior.  Head CT performed and reviewed.   CT shows a hypodensity in the right MCA distribution with 25mm  midline shift.  Interestingly, neurological examination is quite  benign and shows no significant focality.  Would be concerned  about another etiology other than stroke.  Further work up  recommended.    Type of Study: Modified Barium Swallowing Study Reason for Referral: Objectively evaluate swallowing function Diet Prior to this Study: NPO Temperature Spikes Noted: No Respiratory Status: Nasal cannula History of Recent Intubation: No Behavior/Cognition: Alert;Cooperative;Pleasant mood Oral Cavity - Dentition: Adequate natural dentition;Missing  dentition Oral Motor / Sensory Function: Impaired - see Bedside swallow  eval Self-Feeding Abilities: Able to feed self Patient Positioning: Upright in chair Baseline Vocal Quality: Clear Volitional Cough: Strong Volitional Swallow: Able to elicit Anatomy:  (appearance of hypertensive UES) Pharyngeal Secretions: Not observed secondary MBS    Reason for Referral Objectively evaluate swallowing function   Oral Phase Oral Preparation/Oral Phase Oral Phase: Impaired Oral - Nectar Oral - Nectar Cup: Left pocketing in lateral  sulci;Lingual/palatal residue;Left anterior bolus loss Oral - Nectar Straw: Left pocketing in lateral  sulci;Lingual/palatal residue Oral - Thin Oral - Thin Cup: Left pocketing in lateral sulci;Lingual/palatal  residue Oral - Solids Oral - Puree: Left pocketing in lateral sulci;Lingual/palatal  residue Oral - Mechanical Soft: Left pocketing in lateral  sulci;Lingual/palatal residue   Pharyngeal Phase Pharyngeal Phase Pharyngeal Phase: Impaired Pharyngeal -  Nectar Pharyngeal - Nectar Cup: Reduced airway/laryngeal closure;Reduced  laryngeal elevation;Reduced anterior laryngeal  mobility;Penetration/Aspiration during  swallow;Penetration/Aspiration after swallow;Trace  aspiration;Pharyngeal residue - valleculae;Pharyngeal residue -  pyriform sinuses (UES opening improved with Chin tuck, reduced  residuals) Penetration/Aspiration details (nectar cup): Material enters  airway, passes BELOW cords then ejected out Pharyngeal - Nectar Straw: Reduced airway/laryngeal  closure;Reduced laryngeal elevation;Reduced anterior laryngeal  mobility;Pharyngeal residue - valleculae;Pharyngeal residue -  pyriform sinuses (improved with chin tuck) Penetration/Aspiration details (nectar straw): Material enters  airway, remains ABOVE vocal cords then ejected out;Material does  not enter airway Pharyngeal - Thin Pharyngeal - Thin Cup: Reduced airway/laryngeal closure;Reduced  laryngeal elevation;Reduced anterior laryngeal  mobility;Penetration/Aspiration during  swallow;Penetration/Aspiration after swallow;Pharyngeal residue -  valleculae;Pharyngeal residue - pyriform sinuses;Significant  aspiration (Amount);Compensatory strategies attempted (Comment)  (chin tuck worsed aspiration) Penetration/Aspiration details (thin cup): Material enters  airway, passes BELOW cords and not ejected out despite cough  attempt by patient Pharyngeal - Solids Pharyngeal - Puree: Reduced laryngeal elevation;Reduced anterior  laryngeal mobility;Pharyngeal residue - valleculae;Pharyngeal  residue - pyriform sinuses Pharyngeal - Mechanical Soft: Pharyngeal residue -  valleculae;Pharyngeal  residue - pyriform sinuses;Reduced  laryngeal elevation;Reduced airway/laryngeal closure;Reduced  anterior laryngeal mobility Pharyngeal - Pill: Pharyngeal residue - cp segment (Pill loged at  UES)  Cervical Esophageal Phase    GO    Cervical Esophageal Phase Cervical Esophageal Phase: Impaired Cervical Esophageal Phase - Comment  Cervical Esophageal Comment: Apperance of limited opening of UES  with moderate residuals pooling in pyriforms. Pill lodged at UES,  required several boluses to transit (no pt awareness). Chin tuck  aided in opening of UES        [name], [place] CCC-SLP [phone]  [name] 11/30/2013, 2:31 PM     Assessment/Plan: Diagnosis: Right MCA infarct 1. Does the need for close, 24 hr/day medical supervision in concert with the patient's rehab needs make it unreasonable for this patient to be served in a less intensive setting? Yes 2. Co-Morbidities requiring supervision/potential complications: afib 3. Due to bladder management, bowel management, safety, skin/wound care, disease management, medication administration, pain management and patient education, does the patient require 24 hr/day rehab nursing? Yes 4. Does the patient require coordinated care of a physician, rehab nurse, PT (1-2 hrs/day, 5 days/week) and OT (1-2 hrs/day, 5 days/week) to address physical and functional deficits in the context of the above medical diagnosis(es)? Yes Addressing deficits in the following areas: balance, endurance, locomotion, strength, transferring, bowel/bladder control, bathing, dressing, feeding, grooming, toileting and psychosocial support 5. Can the patient actively participate in an intensive therapy program of at least 3 hrs of therapy per day at least 5 days per week? Yes 6. The potential for patient to make measurable gains while on inpatient rehab is excellent 7. Anticipated functional outcomes upon discharge from inpatient rehab are mod I with PT, mod I with OT, n/a with SLP. 8. Estimated rehab length of stay to reach the above functional goals is: 7 days 9. Does the patient have adequate social supports to accommodate these discharge functional goals? Yes and Potentially 10. Anticipated D/C setting: Home 11. Anticipated post D/C treatments: [place] therapy 12. Overall Rehab/Functional Prognosis:  excellent  RECOMMENDATIONS: This patient's condition is appropriate for continued rehabilitative care in the following setting: CIR Patient has agreed to participate in recommended program. Yes Note that insurance prior authorization may be required for reimbursement for recommended care.  Comment: Rehab Admissions Coordinator to follow up.  Thanks,  Meredith Staggers, MD, Mellody Drown     12/01/2013

## 2013-12-01 NOTE — Progress Notes (Addendum)
Subjective:  Patient is much more alert, his 2 daughters are at the bedside.  Denies any chest pain or shortness of breath.  Having breakfast. In and out of A. Fib. BP borderline.  Objective:  Vital Signs in the last 24 hours: Temp:  [97.4 F (36.3 C)-98.6 F (37 C)] 97.4 F (36.3 C) (02/04 0732) Pulse Rate:  [52-115] 72 (02/04 0732) Resp:  [0-24] 20 (02/04 0732) BP: (94-135)/(41-75) 102/61 mmHg (02/04 0732) SpO2:  [92 %-98 %] 98 % (02/04 0732)  Intake/Output from previous day: 02/03 0701 - 02/04 0700 In: 2074 [P.O.:480; I.V.:1594] Out: 350 [Urine:350]  Physical Exam: General appearance: alert, cooperative, appears stated age, no distress and appears dehydrated.  Eyes: negative findings: lids and lashes normal  Neck: no adenopathy, no carotid bruit, no JVD, supple, symmetrical, trachea midline and thyroid not enlarged, symmetric, no tenderness/mass/nodules  Neck: JVP - normal, carotids 2+= without bruits  Resp: clear to auscultation bilaterally  Chest wall: no tenderness  Cardio: S1 variable with normal S2. II/VI SEM in apex and II/VI EDM in the right sternal border.  GI: soft, non-tender; bowel sounds normal; no masses, no organomegaly  Extremities: extremities normal, atraumatic, no cyanosis or edema  Pulses: 2+ and symmetric  Neurologic: Grossly normal  Lab Results:  Recent Labs  11/29/13 1531  WBC 12.4*  HGB 13.9  PLT 246    Recent Labs  11/29/13 1531  NA 143  K 4.6  CL 107  CO2 19  GLUCOSE 114*  BUN 24*  CREATININE 1.30    Recent Labs  11/29/13 1531  TROPONINI <0.30   Hepatic Function Panel  Recent Labs  11/29/13 1531  PROT 7.3  ALBUMIN 3.5  AST 54*  ALT 18  ALKPHOS 76  BILITOT 0.7    Recent Labs  11/30/13 0245  CHOL 140   Lipid Panel     Component Value Date/Time   CHOL 140 11/30/2013 0245   TRIG 98 11/30/2013 0245   HDL 49 11/30/2013 0245   CHOLHDL 2.9 11/30/2013 0245   VLDL 20 11/30/2013 0245   LDLCALC 71 11/30/2013 0245   Scheduled  Meds: . aspirin  300 mg Rectal Daily   Or  . aspirin  325 mg Oral Daily  . atorvastatin  20 mg Oral q1800  . metoprolol  2.5 mg Intravenous Q6H   Continuous Infusions: . sodium chloride 20 mL/hr at 12/01/13 0011  . diltiazem (CARDIZEM) infusion 5 mg/hr (12/01/13 0805)   PRN Meds:.[place], senna-docusate  Echo 11/30/2013: Left ventricle: The cavity size was normal. There was mild concentric hypertrophy. Systolic function was normal. The estimated ejection fraction was in the range of 50% to 55%. Mild AS and AR.   Assessment/Plan:  1.  Atrial fibrillation with rapid ventricular response still on IV cardizem and IV metoprolol  2.  Stroke, no gross motor deficits MRI 11/30/2013 1. Large posterior and inferior right MCA division infarct without  evidence for hemorrhage. Much of the primary motor cortex is spared.  2. Atrophy and moderate white matter disease is present bilaterally,  likely chronic.  3. Focal high-grade stenosis of the posterior inferior right M2  division.  3.  Hypertension  Recommendation: I will switch to PO cardizem and metoprolol. Transfer to tele and ambulate when HR stable. Start Amio as BP is borderline. Will need SNF. Would prefer to start Warfarin given Paroxysmal A. FIb. Await neurology input regarding the anticoagulation.  Laverda Page, M.D. 12/01/2013, 8:34 AM [facility] Pager: [phone] Office: [phone]  If no answer: [phone]  D/W Dr. Leonie Man. Patient with paroxysmal A. Fib is at very high risk for recurrent stroke and instead of IV heparin probably best option to start Eliquis. Will start tonight.

## 2013-12-01 NOTE — Progress Notes (Signed)
Speech Language Pathology Treatment: Dysphagia  Patient Details Name: [name] MRN: [id number] DOB: September 19, 1922 Today's Date: 12/01/2013 Time: 9509-3267 SLP Time Calculation (min): 23 min  Assessment / Plan / Recommendation Clinical Impression  Pt seen during breakfast with two daughters present. SLP provided verbal and visual cues for pt to correctly and effectively complete chin tuck, which he responded to well, with good carry over into meal. Needed min verbal cues for lingual sweep to left buccal cavity. Do suspect there is some sensory impairment on left. Provided explanation to family regarding suspected chronic nature of pts dysphagia and expectation that function is unlikely to improve by much. We did discuss option of continuing restrictive diet during rehabilitation to improve pts functional reserve with the possibility of liberalizing diet when pt is stronger. They felt comfortable with that idea. Also provided instruction regarding thickening liquids and appropriate textures. Pt observed to have adequate functional memory and attention, but is demonstrating signs of labile behavior, becoming quickly tearful, which daughters have noticed as new as well. Recommend CIR.    HPI HPI: 80 y.o. male presenting with a complaint of generalized weakness.  In atrial fibrillation with RVR.  Friends complained of strange behavior.  Head CT performed and reviewed.  CT shows a hypodensity in the right MCA distribution with 59mm midline shift.  Interestingly, neurological examination is quite benign and shows no significant focality.  Would be concerned about another etiology other than stroke.  Further work up recommended.      Pertinent Vitals NA  SLP Plan  Continue with current plan of care    Recommendations Diet recommendations: Dysphagia 3 (mechanical soft);Nectar-thick liquid Liquids provided via: Cup Medication Administration: Crushed with puree Supervision: Full supervision/cueing for  compensatory strategies Compensations: Slow rate;Small sips/bites;Multiple dry swallows after each bite/sip;Follow solids with liquid;Clear throat intermittently Postural Changes and/or Swallow Maneuvers: Chin tuck;Seated upright 90 degrees              General recommendations: Rehab consult Oral Care Recommendations: Oral care BID;Oral care before and after PO Follow up Recommendations: Inpatient Rehab Plan: Continue with current plan of care    GO    [facility], MA CCC-SLP [phone]  [name] 12/01/2013, 10:44 AM

## 2013-12-01 NOTE — Progress Notes (Signed)
ANTICOAGULATION CONSULT NOTE - Initial Consult  Pharmacy Consult for Apixaban Indication: Afib, acute CVA  Allergies  Allergen Reactions  . Codeine Nausea And Vomiting    Patient Measurements: Height: 5\' 5"  (165.1 cm) Weight: 145 lb 8.1 oz (66 kg) IBW/kg (Calculated) : 61.5 Heparin Dosing Weight:   Vital Signs: Temp: 98.4 F (36.9 C) (02/04 1600) Temp src: Oral (02/04 1600) BP: 120/61 mmHg (02/04 1600) Pulse Rate: 86 (02/04 1130)  Labs:  Recent Labs  11/29/13 1531  HGB 13.9  HCT 41.4  PLT 246  CREATININE 1.30  CKTOTAL 1595*  TROPONINI <0.30    Estimated Creatinine Clearance: 32.9 ml/min (by C-G formula based on Cr of 1.3).   Medical History: Past Medical History  Diagnosis Date  . Hypertension   . Hyperlipidemia   . Atrial fibrillation     Medications:  Prescriptions prior to admission  Medication Sig Dispense Refill  . cholecalciferol (VITAMIN D) 1000 UNITS tablet Take 1,000 Units by mouth daily.      . clopidogrel (PLAVIX) 75 MG tablet Take 75 mg by mouth daily.      . ferrous sulfate 325 (65 FE) MG tablet Take 325 mg by mouth daily with breakfast.      . lisinopril (PRINIVIL,ZESTRIL) 10 MG tablet Take 10 mg by mouth daily.      . metoprolol (LOPRESSOR) 50 MG tablet Take 25 mg by mouth daily.      Vladimir Faster Glycol-Propyl Glycol (SYSTANE) 0.4-0.3 % GEL Place 1 drop into both eyes at bedtime.       . pravastatin (PRAVACHOL) 20 MG tablet Take 20 mg by mouth at bedtime.      . vitamin B-12 (CYANOCOBALAMIN) 100 MCG tablet Take 100 mcg by mouth daily.       . vitamin E 400 UNIT capsule Take 400 Units by mouth daily.        Assessment: 80yom with hx Afib (no anticoagulation PTA) admitted with acute CVA to start Apixaban. Based on patient characteristics (Age 80, Wt 66kg, SCr 1.3 mg/dl), patient should receive 5mg  BID. However, Stroke team has recommended to start Apixaban 2.5mg  BID. Spoke with Dr. Einar Gip - recommended to start with Apixaban 2.5mg  BID tonight  and follow-up with Stroke team for clarification of lower dose. - H/H and Plts wnl - No significant bleeding reported - AST slightly elevated (54), ALT wnl  Plan:  1. Apixaban 2.5mg  PO BID 2. Follow-up with Stroke team regarding lower than recommended dose (78mg  BID per patient stats)  Earleen Newport [phone] 12/01/2013,6:31 PM

## 2013-12-01 NOTE — Progress Notes (Signed)
12/01/2013 1200  Cardizem gtt staopped d/t hr 50-60 and SR with PAC's
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
## 2013-12-02 ENCOUNTER — Encounter (HOSPITAL_COMMUNITY): Payer: Self-pay | Admitting: General Practice

## 2013-12-02 ENCOUNTER — Inpatient Hospital Stay (HOSPITAL_COMMUNITY)
Admission: RE | Admit: 2013-12-02 | Discharge: 2013-12-10 | DRG: 945 | Disposition: A | Discharge status: Home / Self Care | Payer: Medicare Other | Source: Intra-hospital | Attending: Physical Medicine & Rehabilitation | Admitting: Physical Medicine & Rehabilitation

## 2013-12-02 DIAGNOSIS — I4891 Unspecified atrial fibrillation: Secondary | ICD-10-CM

## 2013-12-02 DIAGNOSIS — I6789 Other cerebrovascular disease: Secondary | ICD-10-CM | POA: Diagnosis not present

## 2013-12-02 DIAGNOSIS — D518 Other vitamin B12 deficiency anemias: Secondary | ICD-10-CM

## 2013-12-02 DIAGNOSIS — Z8673 Personal history of transient ischemic attack (TIA), and cerebral infarction without residual deficits: Secondary | ICD-10-CM | POA: Diagnosis present

## 2013-12-02 DIAGNOSIS — R269 Unspecified abnormalities of gait and mobility: Secondary | ICD-10-CM | POA: Diagnosis not present

## 2013-12-02 DIAGNOSIS — I498 Other specified cardiac arrhythmias: Secondary | ICD-10-CM | POA: Diagnosis not present

## 2013-12-02 DIAGNOSIS — G8110 Spastic hemiplegia affecting unspecified side: Secondary | ICD-10-CM

## 2013-12-02 DIAGNOSIS — Z79899 Other long term (current) drug therapy: Secondary | ICD-10-CM

## 2013-12-02 DIAGNOSIS — Z5189 Encounter for other specified aftercare: Principal | ICD-10-CM

## 2013-12-02 DIAGNOSIS — I10 Essential (primary) hypertension: Secondary | ICD-10-CM

## 2013-12-02 DIAGNOSIS — E785 Hyperlipidemia, unspecified: Secondary | ICD-10-CM

## 2013-12-02 DIAGNOSIS — Z7902 Long term (current) use of antithrombotics/antiplatelets: Secondary | ICD-10-CM | POA: Diagnosis not present

## 2013-12-02 DIAGNOSIS — R1310 Dysphagia, unspecified: Secondary | ICD-10-CM

## 2013-12-02 DIAGNOSIS — I639 Cerebral infarction, unspecified: Secondary | ICD-10-CM

## 2013-12-02 DIAGNOSIS — I509 Heart failure, unspecified: Secondary | ICD-10-CM | POA: Diagnosis not present

## 2013-12-02 DIAGNOSIS — I6340 Cerebral infarction due to embolism of unspecified cerebral artery: Secondary | ICD-10-CM

## 2013-12-02 DIAGNOSIS — I6350 Cerebral infarction due to unspecified occlusion or stenosis of unspecified cerebral artery: Secondary | ICD-10-CM | POA: Diagnosis not present

## 2013-12-02 DIAGNOSIS — I6330 Cerebral infarction due to thrombosis of unspecified cerebral artery: Secondary | ICD-10-CM

## 2013-12-02 MED ORDER — APIXABAN 2.5 MG PO TABS
2.5000 mg | ORAL_TABLET | Freq: Two times a day (BID) | ORAL | Status: DC
  Administered 2013-12-02: 2.5 mg via ORAL
  Filled 2013-12-02 (×2): qty 1

## 2013-12-02 MED ORDER — AMIODARONE HCL 400 MG PO TABS: 200.0000 mg | ORAL_TABLET | Freq: Every day | ORAL | Status: DC

## 2013-12-02 MED ORDER — AMIODARONE HCL 200 MG PO TABS
400.0000 mg | ORAL_TABLET | Freq: Two times a day (BID) | ORAL | Status: DC
  Administered 2013-12-02: 400 mg via ORAL
  Filled 2013-12-02 (×2): qty 2

## 2013-12-02 MED ORDER — ONDANSETRON HCL 4 MG/2ML IJ SOLN: 4.0000 mg | Freq: Four times a day (QID) | INTRAMUSCULAR | Status: DC | PRN

## 2013-12-02 MED ORDER — APIXABAN 5 MG PO TABS
5.0000 mg | ORAL_TABLET | Freq: Two times a day (BID) | ORAL | Status: DC
  Administered 2013-12-03 – 2013-12-10 (×15): 5 mg via ORAL
  Filled 2013-12-02 (×18): qty 1

## 2013-12-02 MED ORDER — SENNOSIDES-DOCUSATE SODIUM 8.6-50 MG PO TABS: 2.0000 | ORAL_TABLET | Freq: Every evening | ORAL | Status: DC | PRN

## 2013-12-02 MED ORDER — SORBITOL 70 % SOLN: 30.0000 mL | Freq: Every day | Status: DC | PRN

## 2013-12-02 MED ORDER — DILTIAZEM HCL ER COATED BEADS 180 MG PO CP24: 180.0000 mg | ORAL_CAPSULE | Freq: Every day | ORAL | Status: DC

## 2013-12-02 MED ORDER — DILTIAZEM HCL ER COATED BEADS 180 MG PO CP24
180.0000 mg | ORAL_CAPSULE | Freq: Every day | ORAL | Status: DC
  Administered 2013-12-03 – 2013-12-07 (×5): 180 mg via ORAL
  Filled 2013-12-02 (×7): qty 1

## 2013-12-02 MED ORDER — ONDANSETRON HCL 4 MG PO TABS: 4.0000 mg | ORAL_TABLET | Freq: Four times a day (QID) | ORAL | Status: DC | PRN

## 2013-12-02 MED ORDER — APIXABAN 5 MG PO TABS: 2.5000 mg | ORAL_TABLET | Freq: Two times a day (BID) | ORAL | Status: DC

## 2013-12-02 MED ORDER — AMIODARONE HCL 200 MG PO TABS
200.0000 mg | ORAL_TABLET | Freq: Every day | ORAL | Status: DC
  Administered 2013-12-03 – 2013-12-07 (×5): 200 mg via ORAL
  Filled 2013-12-02 (×7): qty 1

## 2013-12-02 MED ORDER — ATORVASTATIN CALCIUM 20 MG PO TABS
20.0000 mg | ORAL_TABLET | Freq: Every day | ORAL | Status: DC
  Administered 2013-12-02 – 2013-12-09 (×8): 20 mg via ORAL
  Filled 2013-12-02 (×9): qty 1

## 2013-12-02 MED ORDER — RESOURCE THICKENUP CLEAR PO POWD
ORAL | Status: DC | PRN
  Filled 2013-12-02: qty 125

## 2013-12-02 MED ORDER — ACETAMINOPHEN 325 MG PO TABS: 325.0000 mg | ORAL_TABLET | ORAL | Status: DC | PRN

## 2013-12-02 MED ORDER — METOPROLOL TARTRATE 25 MG PO TABS
25.0000 mg | ORAL_TABLET | Freq: Two times a day (BID) | ORAL | Status: DC
  Administered 2013-12-02 – 2013-12-10 (×16): 25 mg via ORAL
  Filled 2013-12-02 (×19): qty 1

## 2013-12-02 NOTE — Progress Notes (Addendum)
Clinical Social Work Department CLINICAL SOCIAL WORK PLACEMENT NOTE 12/02/2013  Patient:  [name], [name]  Account Number:  [id number] Admit date:  11/29/2013  Clinical Social Worker:  Ky Barban, Latanya Presser  Date/time:  12/02/2013 11:04 AM  Clinical Social Work is seeking post-discharge placement for this patient at the following level of care:   [place]   (*CSW will update this form in Epic as items are completed)   N/A-clinicals sent to [facility] at pt/daughter request  Patient/family provided with [place] Department of Clinical Social Work's list of facilities offering this level of care within the geographic area requested by the patient (or if unable, by the patient's family).  12/02/2013  Patient/family informed of their freedom to choose among providers that offer the needed level of care, that participate in Medicare, Medicaid or managed care program needed by the patient, have an available bed and are willing to accept the patient.  N/A-clinicals not sent to this facility  Patient/family informed of [facility]' ownership interest in [facility], as well as of the fact that they are under no obligation to receive care at this facility.  PASARR submitted to EDS on EXISTING PASARR number received from EDS on EXISTING  FL2 transmitted to all facilities in geographic area requested by pt/family on  12/02/2013 FL2 transmitted to all facilities within larger geographic area on   Patient informed that his/her managed care company has contracts with or will negotiate with  certain facilities, including the following:     Patient/family informed of bed offers received: 12/02/2013  Patient chooses bed at  Physician recommends and patient chooses bed at    Patient to be transferred to  on   Patient to be transferred to facility by   The following physician request were entered in Epic:   Additional Comments:   Ky Barban, MSW, [place] Worker [phone]
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
## 2013-12-02 NOTE — Discharge Summary (Signed)
Physician Discharge Summary  Patient ID: [name] MRN: [id number] DOB/AGE: 78-May-1923 78 y.o.  Admit date: 11/29/2013 Discharge date: 12/02/2013  Primary Discharge Diagnosis Atrial fibrillation with rapid ventricular response 2. Large posterior and inferior right MCA stroke/infarct.  No evidence of hemorrhage.  Cerebral atherosclerosis. Secondary Discharge Diagnosis Hypertension Dehydration, resolved Chronic diastolic heart failure  Consults: neurology consultation with neuro hospitalists and  Dr. Antony Contras. Dr. Alger Simons, Physical Medicine and rehabilitation.  Hospital Course: [name] is a very pleasant 80 year old Caucasian male, who was about to turn 80 years of age tomorrow, who I have been seeing for the past 3 years and states that he has had a stroke in 2009, was told to have atrial fibrillation at that time. No further records are available to me and when he establish with me, he was in sinus rhythm and was on aspirin and Plavix. I been seeing him for the last 3 years and he is always maintained sinus rhythm with PVCs and PACs. He has had a normal stress EKG on 02/26/2011 and an echocardiogram in June 2013 had revealed moderate aortic regurgitation, trace aortic stenosis, unchanged from 2004.   He was admitted to [facility] after he was found having fallen down on the ground on 11/29/2013.  Initially admitted as congestive heart failure due to elevated BNP, however he was also found to be in atrial fibrillation with rapid ventricular response, dehydrated, and was found to have abnormal CT revealing stroke.  He was hydrated gently, and was started on IV Cardizem and IV metoprolol as he had difficulty in swallowing initially.  Neurology was also consulted.  Fortunately patient over the next 2 days improved remarkably well.  He was able to eat, he did go through swallowing evaluation and to eat and fluid consistency was told to be thick.  He had no focal motor  deficits.  There was no speech impairment.  2 of his daughters also visited him and they were coming to see him for his 71th birthday.  On the day of discharge patient has been walking with help of rehabilitation, felt to be a very good candidate for inpatient rehabilitation by Dr. Kavin Leech. He had been converted to sinus rhythm and felt that he was safe to be discharged.  For atrial fibrillation with rapid ventricular response, he was also started on IV amiodarone as his blood pressure was borderline.  He had brief conversion to sinus rhythm on the second day, but on the third day he has maintained sinus rhythm.  Neurology was consulted, felt safe to start Eliquis, although the dose was told to be low at 2.5 mg, he did qualify for 5 mg dosing.  I have discontinued his Plavix.  Aspirin was also discontinued.   Recommendations on discharge: patient being discharged to inpatient rehabilitation.  He will see is back in the office in 2-3 weeks, we will continue Eliquis at 5 mg by mouth twice a day and amiodarone 200 mg by mouth daily.  He'll also be on metoprolol 25 mg by mouth twice a day along with Cardizem CD 180 mg by mouth daily.  Heart rate needs to be monitored along with blood pressure.   Discharge Exam: Blood pressure 123/70, pulse 76, temperature 98.4 F (36.9 C), temperature source Oral, resp. rate 14, height 5\' 5"  (1.651 m), weight 66 kg (145 lb 8.1 oz), SpO2 95.00%.  Physical Exam:  General appearance: alert, cooperative, appears stated age, no distress and appears dehydrated.  Eyes:  negative findings: lids and lashes normal  Neck: no adenopathy, no carotid bruit, no JVD, supple, symmetrical, trachea midline and thyroid not enlarged, symmetric, no tenderness/mass/nodules  Neck: JVP - normal, carotids 2+= without bruits  Resp: clear to auscultation bilaterally  Chest wall: no tenderness  Cardio: S1 normal S2. II/VI SEM in apex and II/VI EDM in the right sternal border.  GI: soft,  non-tender; bowel sounds normal; no masses, no organomegaly  Extremities: extremities normal, atraumatic, no cyanosis or edema  Pulses: 2+ and symmetric  Neurologic: Grossly normal  Labs:   Lab Results  Component Value Date   WBC 12.4* 11/29/2013   HGB 13.9 11/29/2013   HCT 41.4 11/29/2013   MCV 95.4 11/29/2013   PLT 246 11/29/2013    Recent Labs Lab 11/29/13 1531  NA 143  K 4.6  CL 107  CO2 19  BUN 24*  CREATININE 1.30  CALCIUM 9.3  PROT 7.3  BILITOT 0.7  ALKPHOS 76  ALT 18  AST 54*  GLUCOSE 114*   Lab Results  Component Value Date   CKTOTAL 1595* 11/29/2013   CKMB 1.5 04/13/2009   TROPONINI <0.30 11/29/2013    Lipid Panel     Component Value Date/Time   CHOL 140 11/30/2013 0245   TRIG 98 11/30/2013 0245   HDL 49 11/30/2013 0245   CHOLHDL 2.9 11/30/2013 0245   VLDL 20 11/30/2013 0245   LDLCALC 71 11/30/2013 0245    EKG: 11/29/2013: Atrial fibrillation with rapid ventricular response, LVH, no evidence of ischemia.  Echo 11/30/2013: Left ventricle: The cavity size was normal. There was mild concentric hypertrophy. Systolic function was normal. The estimated ejection fraction was in the range of 50% to 55%. Mild AS and AR.    Radiology: Ct Head Wo Contrast  11/29/2013   IMPRESSION: 1. 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 2. Stable atrophy and chronic small vessel white matter ischemic changes in both cerebral hemispheres. 3. Chronic left maxillary sinusitis and retention cyst without significant change.   Electronically Signed   By: Enrique Sack M.D.   On: 11/29/2013 17:17   CT scan head 11/29/2013:  IMPRESSION: 1. 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 2. Stable atrophy and chronic small vessel white matter ischemic changes in both cerebral hemispheres. 3. Chronic left maxillary sinusitis and retention cyst without  significant change.   Electronically Signed   By: Enrique Sack M.D.   On: 11/29/2013 17:17   MRI brain 11/30/2013:  IMPRESSION: 1. Large posterior and inferior right MCA division infarct without evidence for hemorrhage. Much of the primary motor cortex is spared. 2. Atrophy and moderate white matter disease is present bilaterally, likely chronic. 3. Focal high-grade stenosis of the posterior inferior right M2 division.   Electronically Signed   By: Lawrence Santiago M.D.   On: 11/30/2013 MRA Brain 11/30/2013:  IMPRESSION: 1. Large posterior and inferior right MCA division infarct without evidence for hemorrhage. Much of the primary motor cortex is spared. 2. Atrophy and moderate white matter disease is present bilaterally, likely chronic. 3. Focal high-grade stenosis of the posterior inferior right M2 division.   Electronically Signed   By: Lawrence Santiago M.D.   On: 11/30/2013  CXR 11/29/2013:  IMPRESSION: 1. Interval cardiomegaly, mild pulmonary vascular congestion and probable mild interstitial pulmonary edema. 2. Stable large hiatal hernia. 3. 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   Electronically Signed   By: Enrique Sack M.D.   On: 11/29/2013   FOLLOW UP PLANS AND APPOINTMENTS    Medication List    STOP taking these medications       clopidogrel 75 MG tablet  Commonly known as:  PLAVIX     lisinopril 10 MG tablet  Commonly known as:  PRINIVIL,ZESTRIL     vitamin E 400 UNIT capsule      TAKE these medications       amiodarone 400 MG tablet  Commonly known as:  PACERONE  Take 0.5 tablets (200 mg total) by mouth daily.     apixaban 5 MG Tabs tablet  Commonly known as:  ELIQUIS  Take 0.5 tablets (2.5 mg total) by mouth 2 (two) times daily.     cholecalciferol 1000 UNITS tablet  Commonly known as:  VITAMIN D  Take 1,000 Units by mouth daily.     diltiazem 180 MG 24 hr capsule  Commonly known as:  CARDIZEM CD  Take 1 capsule (180 mg total) by mouth daily.     ferrous  sulfate 325 (65 FE) MG tablet  Take 325 mg by mouth daily with breakfast.     metoprolol 50 MG tablet  Commonly known as:  LOPRESSOR  Take 25 mg by mouth daily.     pravastatin 20 MG tablet  Commonly known as:  PRAVACHOL  Take 20 mg by mouth at bedtime.     SYSTANE 0.4-0.3 % Gel  Generic drug:  Polyethyl Glycol-Propyl Glycol  Place 1 drop into both eyes at bedtime.     vitamin B-12 100 MCG tablet  Commonly known as:  CYANOCOBALAMIN  Take 100 mcg by mouth daily.           Follow-up Information   Follow up with Laverda Page, MD On 12/20/2013. (1 pm. Bring all medications)    Specialty:  Cardiology   Contact information:   [place]. [address] [place] [address] [phone]        Laverda Page, MD 12/02/2013, 3:44 PM  Pager: [phone] Office: [phone] If no answer: [phone]

## 2013-12-02 NOTE — Progress Notes (Signed)
Subjective:  Patient is much more alert, his 2 daughters are at the bedside. Sitting and having breakfast on the chair and is his "old self and funny". Denies any chest pain or shortness of breath.  Now back in sinus rhythm.   Objective:  Vital Signs in the last 24 hours: Temp:  [97.5 F (36.4 C)-98.4 F (36.9 C)] 98.4 F (36.9 C) (02/05 0800) Pulse Rate:  [76-86] 76 (02/04 2300) Resp:  [14-23] 14 (02/05 0013) BP: (95-158)/(37-83) 123/70 mmHg (02/05 0800) SpO2:  [95 %-96 %] 95 % (02/05 0800)  Intake/Output from previous day: 02/04 0701 - 02/05 0700 In: 365 [P.O.:240; I.V.:125] Out: 700 [Urine:700]  Physical Exam: General appearance: alert, cooperative, appears stated age, no distress and appears dehydrated.  Eyes: negative findings: lids and lashes normal  Neck: no adenopathy, no carotid bruit, no JVD, supple, symmetrical, trachea midline and thyroid not enlarged, symmetric, no tenderness/mass/nodules  Neck: JVP - normal, carotids 2+= without bruits  Resp: clear to auscultation bilaterally  Chest wall: no tenderness  Cardio: S1 normal S2. II/VI SEM in apex and II/VI EDM in the right sternal border.  GI: soft, non-tender; bowel sounds normal; no masses, no organomegaly  Extremities: extremities normal, atraumatic, no cyanosis or edema  Pulses: 2+ and symmetric  Neurologic: Grossly normal  Lab Results:  Recent Labs  11/29/13 1531  WBC 12.4*  HGB 13.9  PLT 246    Recent Labs  11/29/13 1531  NA 143  K 4.6  CL 107  CO2 19  GLUCOSE 114*  BUN 24*  CREATININE 1.30    Recent Labs  11/29/13 1531  TROPONINI <0.30   Hepatic Function Panel  Recent Labs  11/29/13 1531  PROT 7.3  ALBUMIN 3.5  AST 54*  ALT 18  ALKPHOS 76  BILITOT 0.7    Recent Labs  11/30/13 0245  CHOL 140   Lipid Panel     Component Value Date/Time   CHOL 140 11/30/2013 0245   TRIG 98 11/30/2013 0245   HDL 49 11/30/2013 0245   CHOLHDL 2.9 11/30/2013 0245   VLDL 20 11/30/2013 0245   LDLCALC  71 11/30/2013 0245   Scheduled Meds: . amiodarone  400 mg Oral BID  . apixaban  2.5 mg Oral BID  . atorvastatin  20 mg Oral q1800  . diltiazem  180 mg Oral Daily  . metoprolol tartrate  25 mg Oral BID   Continuous Infusions: . sodium chloride Stopped (12/01/13 1200)   PRN Meds:.[place], senna-docusate  Echo 11/30/2013: Left ventricle: The cavity size was normal. There was mild concentric hypertrophy. Systolic function was normal. The estimated ejection fraction was in the range of 50% to 55%. Mild AS and AR.   Assessment/Plan:  1.  Atrial fibrillation with rapid ventricular response still on IV cardizem and IV metoprolol  2.  Stroke, no gross motor deficits MRI 11/30/2013 1. Large posterior and inferior right MCA division infarct without  evidence for hemorrhage. Much of the primary motor cortex is spared.  2. Atrophy and moderate white matter disease is present bilaterally,  likely chronic.  3. Focal high-grade stenosis of the posterior inferior right M2  division.  3.  Hypertension  Recommendation:  Patient now on Amiodarone and on discharge will reduce this to 200mg  daily. Rate control strategy, however he has PAF and now on anticoagulation with Eliquis. Will increase to 5 mg BID as this is the appropriate dose for him with normal renal function and body weight. Stable from cardiac standpoint  for discharge to IP rehab.  Laverda Page, M.D. 12/02/2013, 8:52 AM [facility] Pager: [phone] Office: [phone] If no answer: [phone]  Please note I have left this addendum from yesterday 12/01/2013 so as to the neurology would see this note. D/W Dr. Leonie Man. Patient with paroxysmal A. Fib is at very high risk for recurrent stroke and instead of IV heparin probably best option to start Eliquis. Will start tonight.

## 2013-12-02 NOTE — Discharge Instructions (Signed)
Information on my medicine - ELIQUIS (apixaban)  Anticoagulation, Generic Anticoagulants are medications used to prevent clots from developing in your veins. These medications are also known as blood thinners. If blood clots are untreated, they could travel to your lungs. This is called a pulmonary embolus. A blood clot in your lungs can be fatal.  Caregivers often use anticoagulants to prevent clots following surgery. Anticoagulants are also used along with aspirin when the heart is not getting enough blood. Another anticoagulant called warfarin is started 2 to 3 days after a rapid-acting injectable anticoagulant is started. The rapid-acting anticoagulants are usually continued until warfarin has begun to work. Your caregiver will judge this length of time by blood tests known as the prothrombin time (PT) and International Normalization Ratio (INR). This means that your blood is at the necessary and best level to prevent clots. RISKS AND COMPLICATIONS  If you have received recent epidural anesthesia, spinal anesthesia, or a spinal tap while receiving anticoagulants, you are at risk for developing a blood clot in or around the spine. This condition could result in long-term or permanent paralysis.  Because anticoagulants thin your blood, severe bleeding may occur from any tissue or organ. Symptoms of the blood being too thin may include:  Bleeding from the nose or gums that does not stop quickly.  Unusual bruising or bruising easily.  Swelling or pain at an injection site.  A cut that does not stop bleeding within 10 minutes.  Continual nausea for more than 1 day or vomiting blood.  Coughing up blood.  Blood in the urine which may appear as pink, red, or brown urine.  Blood in bowel movements which may appear as red, dark or black stools.  Sudden weakness or numbness of the face, arm, or leg, especially on one side of the body.  Sudden confusion.  Trouble speaking (aphasia) or  understanding.  Sudden trouble seeing in one or both eyes.  Sudden trouble walking.  Dizziness.  Loss of balance or coordination.  Severe pain, such as a headache, joint pain, or back pain.  Fever.  Too little anticoagulation continues to allow the risk for blood clots. HOME CARE INSTRUCTIONS   Due to the complications of anticoagulants, it is very important that you take your anticoagulant as directed by your caregiver. Anticoagulants need to be taken exactly as instructed. Be sure you understand all your anticoagulant instructions.  Warfarin. Your caregiver will advise you on the length of treatment (usually 3 6 months, sometimes lifelong).  Take warfarin exactly as directed by your caregiver. It is recommended that you take your warfarin dose at the same time of the day. It is preferred that you take warfarin in the late afternoon. If you have been told to stop taking warfarin, do not resume taking warfarin until directed to do so by your caregiver. Follow your caregiver's instructions if you accidentally take an extra dose or miss a dose of warfarin. It is very important to take warfarin as directed since bleeding or blood clots could result in chronic or permanent injury, pain, or disability.  Too much and too little warfarin are both dangerous. Too much warfarin increases the risk of bleeding. Too little warfarin continues to allow the risk for blood clots. While taking warfarin, you will need to have regular blood tests to measure your blood clotting time. These blood tests usually include both the PT and INR tests. The PT and INR results allow your caregiver to adjust your dose of warfarin. The dose  can change for many reasons. It is critically important that you take warfarin exactly as prescribed, and that you have your PT and INR levels drawn exactly as directed. Follow up with your laboratory test appointments as directed. It is very important to keep your lab appointments. Not  keeping lab appointments could result in a chronic or permanent injury, pain, or disability.  Many foods, especially foods high in vitamin K can interfere with warfarin and affect the PT and INR results. Foods high in vitamin K include spinach, kale, broccoli, cabbage, collard and turnip greens, brussels sprouts, peas, cauliflower, seaweed, and parsley as well as beef and pork liver, green tea, and soybean oil. You should eat a consistent amount of foods high in vitamin K. Avoid major changes in your diet, or notify your caregiver before changing your diet. Arrange a visit with a dietitian to answer your questions.  Many medicines can interfere with warfarin and affect the PT and INR results. You must tell your caregiver about any and all medicines you take, this includes all vitamins and supplements. Ask your caregiver before taking these. Prescription and over-the-counter medicine consistency is critical to warfarin management. It is important that potential interactions are checked before you start a new medicine. Be especially cautious with aspirin and anti-inflammatory medicines. Ask your caregiver before taking these. Medicines such as antibiotics and acid-reducing medicine can interact with warfarin and can cause an increased warfarin effect. Warfarin can also interfere with the effectiveness of medicines you are taking. Do not take or discontinue any prescribed or over-the-counter medicine except on the advice of your caregiver or pharmacist.  Some vitamins, supplements, and herbal products interfere with the effectiveness of warfarin. Vitamin E may increase the anticoagulant effects of warfarin. Vitamin K may can cause warfarin to be less effective. Do not take or discontinue any vitamin, supplement, or herbal product except on the advice of your caregiver or pharmacist.  Alcohol can change the body's ability to handle warfarin. It is best to avoid alcoholic drinks or consume only very small amounts  while taking warfarin. Notify your caregiver if you change your alcohol intake. A sudden increase in alcohol use can increase your risk of bleeding. Chronic alcohol use can cause warfarin to be less effective.  If you have a loss of appetite or get the stomach flu (viral gastroenteritis), talk to your caregiver as soon as possible. A decrease in your normal vitamin K intake can make you more sensitive to your usual dose of warfarin.  Some medical conditions may increase your risk for bleeding while you are taking warfarin. A fever, diarrhea lasting more than a day, worsening heart failure, or worsening liver function are some medical conditions that could affect warfarin. Contact your caregiver if you have any of these medical conditions.  Warfarin can have side effects, such as excessive bruising or bleeding. You will need to hold pressure over cuts for longer than usual.  Be careful not to cut yourself when using sharp objects.  Notify your dentist or other caregivers before procedures.  Limit physical activities or sports that could result in a fall or cause injury. Avoid contact sports.  Wear a medical alert bracelet or carry a medical alert card. SEEK MEDICAL CARE IF:   You develop any rashes.  You have any worsening of the condition for which you are receiving anticoagulation therapy. SEEK IMMEDIATE MEDICAL CARE IF:   Bleeding from the nose or gums does not stop quickly.  You have unusual bruising or  are bruising easily.  Swelling or pain occurs at an injection site.  A cut does not stop bleeding within 10 minutes.  You have continual nausea for more than 1 day or are vomiting blood.  You are coughing up blood.  You have blood in the urine.  You have dark or black stools.  You have sudden weakness or numbness of the face, arm, or leg, especially on one side of the body.  You have sudden confusion.  You have trouble speaking (aphasia) or understanding.  You have  sudden trouble seeing in one or both eyes.  You have sudden trouble walking.  You have dizziness.  You have a loss of balance or coordination.  You have severe pain, such as a headache, joint pain, or back pain.  You have a serious fall or head injury, even if you are not bleeding.  You have an oral temperature above 102 F (38.9 C), not controlled by medicine. ANY OF THESE SYMPTOMS MAY REPRESENT A SERIOUS PROBLEM THAT IS AN EMERGENCY. Do not wait to see if the symptoms will go away. Get medical help right away. Call your local emergency services (911 in [place] DO NOT drive yourself to the hospital. MAKE SURE YOU:   Understand these instructions.  Will watch your condition.  Will get help right away if you are not doing well or get worse. Document Released: 10/14/2005 Document Revised: 07/08/2012 Document Reviewed: 05/18/2008 [facility] Patient Information 2014 [place].  Why was Eliquis prescribed for you? Eliquis was prescribed for you to reduce the risk of a blood clot forming that can cause a stroke if you have a medical condition called atrial fibrillation (a type of irregular heartbeat).  What do You need to know about Eliquis ? Take your Eliquis TWICE DAILY - one tablet in the morning and one tablet in the evening with or without food. If you have difficulty swallowing the tablet whole please discuss with your pharmacist how to take the medication safely.  Take Eliquis exactly as prescribed by your doctor and DO NOT stop taking Eliquis without talking to the doctor who prescribed the medication.  Stopping may increase your risk of developing a stroke.  Refill your prescription before you run out.  After discharge, you should have regular check-up appointments with your healthcare provider that is prescribing your Eliquis.  In the future your dose may need to be changed if your kidney function or weight changes by a significant amount or as you get older.  What do  you do if you miss a dose? If you miss a dose, take it as soon as you remember on the same day and resume taking twice daily.  Do not take more than one dose of ELIQUIS at the same time to make up a missed dose.  Important Safety Information A possible side effect of Eliquis is bleeding. You should call your healthcare provider right away if you experience any of the following:   Bleeding from an injury or your nose that does not stop.   Unusual colored urine (red or dark brown) or unusual colored stools (red or black).   Unusual bruising for unknown reasons.   A serious fall or if you hit your head (even if there is no bleeding).  Some medicines may interact with Eliquis and might increase your risk of bleeding or clotting while on Eliquis. To help avoid this, consult your healthcare provider or pharmacist prior to using any new prescription or non-prescription medications, including herbals,  vitamins, non-steroidal anti-inflammatory drugs (NSAIDs) and supplements.  This website has more information on Eliquis (apixaban): [email or web address].

## 2013-12-02 NOTE — Progress Notes (Addendum)
Clinical Social Work Department BRIEF PSYCHOSOCIAL ASSESSMENT 12/02/2013  Patient:  [name], [name]     Account Number:  [id number]     Admit date:  11/29/2013  Clinical Social Worker:  Freeman Caldron  Date/Time:  12/02/2013 10:54 AM  Referred by:  Physician  Date Referred:  12/02/2013 Referred for  SNF Placement   Other Referral:   Interview type:  Patient Other interview type:   Pt's oldest daughter [name] also at bedside.    PSYCHOSOCIAL DATA Living Status:  ALONE Admitted from facility:   Level of care:   Primary support name:  [name] ([phone]) Primary support relationship to patient:   Degree of support available:   Good--pt's neighbor found him laying on the floor following a fall, and pt's daughter was at bedside during CSW assessment and provides support to pt.    CURRENT CONCERNS Current Concerns  Post-Acute Placement   Other Concerns:    SOCIAL WORK ASSESSMENT / PLAN CSW spoke with pt and daughter [name] about recommendation that pt continues PT/OT before discharging back home. Pt and daughter [name] hopeful pt can go to CIR today, but pt states he has been to [facility] in the past and would like to go there if he cannot go to CIR. Pt gave CSW permission to send clinicals to [facility] as a back-up plan if he cannot go to CIR. CSW has done this and explained I, or unit CSW, will update pt/daughter when [facility] responds to bed request.  Addendum: [facility] responded "yes" to bed request. CSW informed pt and family in room. Pt/family states CIR is the first choice, and CSW explained that CSW has informed RNCM of bed offer to [facility] and RNCM will follow-up with CIR and handle discharge from here. Pt/family understanding and thanked CSW.   Assessment/plan status:  Psychosocial Support/Ongoing Assessment of Needs Other assessment/ plan:   Information/referral to community resources:   ?CIR, ?SNF ([facility])    PATIENT'S/FAMILY'S  RESPONSE TO PLAN OF CARE: Good--pt and daughter friendly, and pt joked with CSW and smiled throughout conversation. Pt states he is eager to get out of the hospital and to get back home, and pt participated in conversation with CSW giving CSW permission to send clinicals to [facility] and expressing understanding that this would be a back-up plan if he is ready for discharge and CIR is unable to take him.       Ky Barban, MSW, [facility] Clinical Social Worker [phone]

## 2013-12-02 NOTE — Progress Notes (Signed)
Stroke Team Progress Note  HISTORY [name] is an 80 y.o. male who has had generalized weakness for the past 2 days. The patient is very difficult to get a history from. He reports that because he has been weak he lowers himself to the floor. On yesterday he was on the floor about 4 hours before he was able to  get himself up. Today 11/29/2013 he called his friend because he could not get off the floor. His friend called paramedics. His friend also felt that the patient had abnormal behavior. Patient was not administerd TPA secondary to Unable to determine LKW. He was admitted for further evaluation and treatment.  SUBJECTIVE Patient up in chair at bedside. Daughters also at bedside.   OBJECTIVE Most recent Vital Signs: Filed Vitals:   12/02/13 0000 12/02/13 0013 12/02/13 0400 12/02/13 0800  BP:  130/37  123/70  Pulse:      Temp: 97.8 F (36.6 C)  97.5 F (36.4 C) 98.4 F (36.9 C)  TempSrc: Oral  Oral Oral  Resp:  14    Height:      Weight:      SpO2:    95%   CBG (last 3)  No results found for this basename: GLUCAP,  in the last 72 hours  IV Fluid Intake:   . sodium chloride Stopped (12/01/13 1200)    MEDICATIONS  . amiodarone  400 mg Oral BID  . apixaban  2.5 mg Oral BID  . atorvastatin  20 mg Oral q1800  . diltiazem  180 mg Oral Daily  . metoprolol tartrate  25 mg Oral BID   PRN:  RESOURCE THICKENUP CLEAR, senna-docusate  Diet:  Dysphagia 3 nectar thick liquids Activity:  Ambulate with assistance DVT Prophylaxis:  SCDs, apixaban  CLINICALLY SIGNIFICANT STUDIES Basic Metabolic Panel:   Recent Labs Lab 11/29/13 1531  NA 143  K 4.6  CL 107  CO2 19  GLUCOSE 114*  BUN 24*  CREATININE 1.30  CALCIUM 9.3   Liver Function Tests:   Recent Labs Lab 11/29/13 1531  AST 54*  ALT 18  ALKPHOS 76  BILITOT 0.7  PROT 7.3  ALBUMIN 3.5   CBC:   Recent Labs Lab 11/29/13 1531  WBC 12.4*  NEUTROABS 9.7*  HGB 13.9  HCT 41.4  MCV 95.4  PLT 246    Coagulation: No results found for this basename: LABPROT, INR,  in the last 168 hours Cardiac Enzymes:   Recent Labs Lab 11/29/13 1531  CKTOTAL 1595*  TROPONINI <0.30   Urinalysis:   Recent Labs Lab 11/29/13 1619  COLORURINE YELLOW  LABSPEC 1.021  PHURINE 5.5  GLUCOSEU NEGATIVE  HGBUR NEGATIVE  BILIRUBINUR SMALL*  KETONESUR 15*  PROTEINUR NEGATIVE  UROBILINOGEN 0.2  NITRITE NEGATIVE  LEUKOCYTESUR NEGATIVE   Lipid Panel    Component Value Date/Time   CHOL 140 11/30/2013 0245   TRIG 98 11/30/2013 0245   HDL 49 11/30/2013 0245   CHOLHDL 2.9 11/30/2013 0245   VLDL 20 11/30/2013 0245   LDLCALC 71 11/30/2013 0245   HgbA1C  Lab Results  Component Value Date   HGBA1C 5.7* 11/30/2013    Urine Drug Screen:     Component Value Date/Time   LABOPIA POSITIVE* 01/22/2010 2015   COCAINSCRNUR NONE DETECTED 01/22/2010 2015   LABBENZ POSITIVE* 01/22/2010 2015   AMPHETMU NONE DETECTED 01/22/2010 2015   THCU NONE DETECTED 01/22/2010 2015   LABBARB  Value: NONE DETECTED        DRUG SCREEN FOR  MEDICAL PURPOSES ONLY.  IF CONFIRMATION IS NEEDED FOR ANY PURPOSE, NOTIFY LAB WITHIN 5 DAYS.        LOWEST DETECTABLE LIMITS FOR URINE DRUG SCREEN Drug Class       Cutoff (ng/mL) Amphetamine      1000 Barbiturate      200 Benzodiazepine   [id number] Tricyclics       [id number] Opiates          300 Cocaine          300 THC              50 01/22/2010 2015    Alcohol Level: No results found for this basename: ETH,  in the last 168 hours    CT of the brain  11/29/2013    1. 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 2. Stable atrophy and chronic small vessel white matter ischemic changes in both cerebral hemispheres. 3. Chronic left maxillary sinusitis and retention cyst without significant change.     MRI of the brain  11/30/2013  1. Large posterior and inferior right MCA division infarct without evidence for hemorrhage. Much of the primary motor  cortex is spared. 2. Atrophy and moderate white matter disease is present bilaterally, likely chronic.   MRA of the brain  11/30/2013   Focal high-grade stenosis of the posterior inferior right M2 division.     2D Echocardiogram  EF 50-55% with no source of embolus.   Carotid Doppler  No evidence of hemodynamically significant internal carotid artery stenosis. Vertebral artery flow is antegrade.   CXR  11/29/2013   : 1. Interval cardiomegaly, mild pulmonary vascular congestion and probable mild interstitial pulmonary edema. 2. Stable large hiatal hernia. 3. 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      EKG  normal sinus rhythm, PAC's noted, atrial fibrillation from previous EKG resolved. For complete results please see formal report.   Therapy Recommendations [facility]  Physical Exam   Pleasant elderly Caucasian male not in distress.Awake alert. Afebrile. Head is nontraumatic. Neck is supple without bruit. Hearing is diminished bilaterally. Cardiac exam no murmur or gallop. Lungs are clear to auscultation. Distal pulses are well felt. Neurological Exam ;  Awake  Alert oriented x 2.. diminished attention, registration and recall. Normal speech and language.eye movements full without nystagmus.fundi were not visualized. Vision acuity and fields appear normal. Hearing is normal. Palatal movements are normal. Face symmetric. Tongue midline. Normal strength, tone, reflexes and coordination. Normal sensation. Gait deferred.  ASSESSMENT Mr. [name] is a 80 y.o. male presenting with generalized weakness. Imaging confirms a large right MCA infarct. Infarct felt to be embolic secondary to atrial fibrillation.  On clopidogrel 75 mg orally every day prior to admission. Now on apixaban for secondary stroke prevention. Work up completed.  atrial fibrillation w/ RVR, off cardizem hypertension  Hyperlipidemia, LDL 71, on pravachol PTA, now on lipitor 20 mg daily, at goal LDL < 100  Mild age  related cognitive impairments  Hospital day # 3  TREATMENT/PLAN  Continue apixaban for secondary stroke prevention.   D/c SCDs as on apixaban  Rehab consult per therapy recommendations.   Medically stable for discharge to rehab when bed available.  No further stroke workup indicated.  Patient has a 10-15% risk of having another stroke over the next year, the highest risk is within 2 weeks of the most recent stroke/TIA (risk of having a stroke following a  stroke or TIA is the same).  Ongoing risk factor control by Primary Care Physician  Stroke Service will sign off. Please call should any needs arise.  Follow up with Dr. Leonie Man, [facility], in 2 months. ( I added to discharge instruction sheet).  Burnetta Sabin, MSN, RN, ANVP-BC, ANP-BC, [facility] Pager: [phone] 12/02/2013 11:24 AM  I have personally obtained a history, examined the patient, evaluated imaging results, and formulated the assessment and plan of care. I agree with the above.  Antony Contras, MD

## 2013-12-02 NOTE — Progress Notes (Signed)
Physical Therapy Treatment Patient Details Name: [name] MRN: [id number] DOB: 03/09/1922 Today's Date: 12/02/2013 Time: 7371-0626 PT Time Calculation (min): 27 min  PT Assessment / Plan / Recommendation  History of Present Illness Pt admitted after falling and being found by his friend.  MRI revealed R MCA CVA.  Pt with hx of falls.   PT Comments   Pt with excellent progress with mobility today. Pt does not recall our prior session or education given but dgtrs present do. Pt without incontinence today and continues to demonstrate balance deficits not yet able to left alone and needs reinforcement for safety and to decrease fall risk with partial LOB with gait without AD.    Follow Up Recommendations  CIR;Supervision for mobility/OOB     Does the patient have the potential to tolerate intense rehabilitation     Barriers to Discharge        Equipment Recommendations       Recommendations for Other Services    Frequency     Progress towards PT Goals Progress towards PT goals: Progressing toward goals  Plan Current plan remains appropriate    Precautions / Restrictions Precautions Precautions: Fall   Pertinent Vitals/Pain HR 95-105 sats 92% on RA with gait No pain    Mobility  Bed Mobility Overal bed mobility: Modified Independent General bed mobility comments: with use of rail  Transfers Overall transfer level: Needs assistance Transfers: Sit to/from Stand;Stand Pivot Transfers Sit to Stand: Supervision Stand pivot transfers: Supervision General transfer comment: cueing for hand placement and safety, pt still somewhat impulsive and standing x 2 prior to items moved out of pt way Ambulation/Gait Ambulation/Gait assistance: Min assist Ambulation Distance (Feet): 150 Feet Assistive device: None Gait Pattern/deviations: Step-through pattern;Staggering left;Staggering right;Narrow base of support Gait velocity interpretation: Below normal speed for  age/gender General Gait Details: pt with unsteady gait with 3 partial LOB with gait but able to ambulate further . pt able to recall room number and correctly return to room. No issues with incontinence today Modified Rankin (Stroke Patients Only) Pre-Morbid Rankin Score: No symptoms Modified Rankin: Moderate disability    Exercises     PT Diagnosis:    PT Problem List:   PT Treatment Interventions:     PT Goals (current goals can now be found in the care plan section)    Visit Information  Last PT Received On: 12/02/13 Assistance Needed: +1 History of Present Illness: Pt admitted after falling and being found by his friend.  MRI revealed R MCA CVA.  Pt with hx of falls.    Subjective Data      Cognition  Cognition Arousal/Alertness: Awake/alert Behavior During Therapy: WFL for tasks assessed/performed Memory: Decreased short-term memory    Balance  Balance Standing balance-Leahy Scale: Fair Standing balance comment: pt stood 5 min at sink with and without single UE support to brush teeth Standardized Balance Assessment Standardized Balance Assessment : Berg Balance Test Berg Balance Test Sit to Stand: Able to stand without using hands and stabilize independently Standing Unsupported: Able to stand 2 minutes with supervision Sitting with Back Unsupported but Feet Supported on Floor or Stool: Able to sit safely and securely 2 minutes Stand to Sit: Controls descent by using hands Transfers: Able to transfer safely, minor use of hands Standing Unsupported with Eyes Closed: Able to stand 10 seconds with supervision Standing Ubsupported with Feet Together: Able to place feet together independently and stand for 1 minute with supervision From Standing, Reach Forward with Outstretched  Arm: Can reach forward >12 cm safely (5") From Standing Position, Pick up Object from Floor: Able to pick up shoe, needs supervision From Standing Position, Turn to Look Behind Over each Shoulder:  Looks behind one side only/other side shows less weight shift Turn 360 Degrees: Able to turn 360 degrees safely but slowly (greater than 10steps to complete turn) Standing Unsupported, Alternately Place Feet on Step/Stool: Able to complete >2 steps/needs minimal assist Standing Unsupported, One Foot in Front: Loses balance while stepping or standing Standing on One Leg: Tries to lift leg/unable to hold 3 seconds but remains standing independently Total Score: 37  End of Session PT - End of Session Equipment Utilized During Treatment: Gait belt Activity Tolerance: Patient tolerated treatment well Patient left: in chair;with call bell/phone within reach;with family/visitor present   GP     Lanetta Inch Beth 12/02/2013, 12:22 PM Elwyn Reach, [place]

## 2013-12-02 NOTE — Progress Notes (Signed)
Rehab admissions - Evaluated for possible admission.  I met with patient and gave him rehab brochures.  He would like to come to rehab prior to going back home.  He lives alone, but he may ask a male friend, Junior Megan Salon, to come stay with him for a few days after discharge.  Bed available and can admit to acute inpatient rehab today.  Call me for questions.  #[phone]
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
## 2013-12-03 ENCOUNTER — Inpatient Hospital Stay (HOSPITAL_COMMUNITY): Payer: Medicare Other

## 2013-12-03 ENCOUNTER — Inpatient Hospital Stay (HOSPITAL_COMMUNITY): Payer: Medicare Other | Admitting: Physical Therapy

## 2013-12-03 DIAGNOSIS — G8110 Spastic hemiplegia affecting unspecified side: Secondary | ICD-10-CM

## 2013-12-03 DIAGNOSIS — I6340 Cerebral infarction due to embolism of unspecified cerebral artery: Secondary | ICD-10-CM

## 2013-12-03 LAB — COMPREHENSIVE METABOLIC PANEL
ALT: 26 U/L (ref 0–53)
AST: 42 U/L — ABNORMAL HIGH (ref 0–37)
Albumin: 3.3 g/dL — ABNORMAL LOW (ref 3.5–5.2)
Alkaline Phosphatase: 71 U/L (ref 39–117)
BUN: 27 mg/dL — ABNORMAL HIGH (ref 6–23)
CO2: 21 mEq/L (ref 19–32)
Calcium: 9.1 mg/dL (ref 8.4–10.5)
Chloride: 104 mEq/L (ref 96–112)
Creatinine, Ser: 1.33 mg/dL (ref 0.50–1.35)
GFR calc Af Amer: 53 mL/min — ABNORMAL LOW (ref >90)
GFR calc non Af Amer: 45 mL/min — ABNORMAL LOW (ref >90)
Glucose, Bld: 98 mg/dL (ref 70–99)
Potassium: 4.2 mEq/L (ref 3.7–5.3)
Sodium: 139 mEq/L (ref 137–147)
Total Bilirubin: 1 mg/dL (ref 0.3–1.2)
Total Protein: 6.9 g/dL (ref 6.0–8.3)

## 2013-12-03 LAB — CBC WITH DIFFERENTIAL/PLATELET
Basophils Absolute: 0 10*3/uL (ref 0.0–0.1)
Basophils Relative: 0 % (ref 0–1)
Eosinophils Absolute: 0.3 10*3/uL (ref 0.0–0.7)
Eosinophils Relative: 4 % (ref 0–5)
HCT: 37.9 % — ABNORMAL LOW (ref 39.0–52.0)
Hemoglobin: 12.8 g/dL — ABNORMAL LOW (ref 13.0–17.0)
Lymphocytes Relative: 28 % (ref 12–46)
Lymphs Abs: 2.4 10*3/uL (ref 0.7–4.0)
MCH: 31.9 pg (ref 26.0–34.0)
MCHC: 33.8 g/dL (ref 30.0–36.0)
MCV: 94.5 fL (ref 78.0–100.0)
Monocytes Absolute: 1.1 10*3/uL — ABNORMAL HIGH (ref 0.1–1.0)
Monocytes Relative: 13 % — ABNORMAL HIGH (ref 3–12)
Neutro Abs: 4.7 10*3/uL (ref 1.7–7.7)
Neutrophils Relative %: 55 % (ref 43–77)
Platelets: 258 10*3/uL (ref 150–400)
RBC: 4.01 MIL/uL — ABNORMAL LOW (ref 4.22–5.81)
RDW: 13 % (ref 11.5–15.5)
WBC: 8.4 10*3/uL (ref 4.0–10.5)

## 2013-12-03 NOTE — Evaluation (Signed)
Speech Language Pathology Assessment and Plan  Patient Details  Name: [name] MRN: [id number] Date of Birth: [birth year]  SLP Diagnosis: Cognitive Impairments;Dysphagia  Rehab Potential: Good ELOS: 7-10 days   Today's Date: 12/03/2013 Time: 1300-1400 Time Calculation (min): 60 min  Problem List:  Patient Active Problem List   Diagnosis Date Noted  . Atrial fibrillation with rapid ventricular response 11/29/2013  . CVA (cerebral infarction) 11/29/2013  . OTHER VITAMIN B12 DEFICIENCY ANEMIA 01/08/2008  . ANAL FISTULA 01/08/2008   Past Medical History:  Past Medical History  Diagnosis Date  . Hypertension   . Hyperlipidemia   . Atrial fibrillation    Past Surgical History:  Past Surgical History  Procedure Laterality Date  . Joint replacement      Assessment / Plan / Recommendation Clinical Impression Pt is a 80 y.o. right-handed male with h/o hypertension, a fib as well as CVA 2009 with little residual. Patient independent living alone and driving PTA. Admitted 02/02 with generalized weakness x2 days after being found in the floor by a friend. MRI of the brain showed large posterior and inferior right MCA division infarct without hemorrhage. MRA of the brain with focal high grade stenosis of the posterior inferior right M2 division. Carotid Dopplers with no ICA stenosis. Echocardiogram with ejection fraction of 55% with normal systolic function. Patient did not receive TPA. Neurology services consulted maintained on aspirin for CVA prophylaxis. F/u cardiology services for a fib with rapid ventricular response maintained on amiodarone/ Cardizem and metoprolol And started on eliquis 02/04 for a fib. Currently on dysphagia 3 nectar thick liquid diet. PT/OTvevaluations completed 02/03 with recommendations for physical medicine rehabilitation consult to consider inpatient rehabilitation services. Admitted for comprehensive rehabilitation program 2/5, with SLP bedside swallow and  cognitive-linguistic evaluations completed 2/6. Pt presents with a mild oropharyngeal dysphagia with suspected esophageal component per MBS 2/3, with recommendations for advancement to Dys 3 textures and thin liquids with intermittent supervision. His cognitive-linguistic skills appear WFL for basic tasks with mild impairment in emergent awareness, however pt would benefit from additional treatment focused on differential diagnosis of higher level tasks. Pt will benefit from skilled SLP services to maximize swallowing safety and increase functional independence prior to discharge home.   Skilled Therapeutic Interventions          Cognitive-linguistic and bedside swallow evaluations completed with results and recommendations reviewed with patient. Patient was seen consuming lunch meal consisting of Dys 3 textures and nectar thick liquids. No overt s/s of aspiration observed both with and without chin tuck. SLP initiated trials of thin liquids via cup sips during meal, with cough x1 noted when patient tried to start talking prior to swallow completion. Pt consumed ~6-8 ounces of water with solid POs. Recommend to advance to thin liquids with monitoring at bedside for overt s/s of aspiration.   SLP Assessment  Patient will need skilled Speech Lanaguage Pathology Services during CIR admission    Recommendations  Diet Recommendations: Dysphagia 3 (Mechanical Soft);Thin liquid Liquid Administration via: Cup;No straw Medication Administration: Whole meds with puree Supervision: Intermittent supervision to cue for compensatory strategies;Patient able to self feed Compensations: Slow rate;Small sips/bites;Multiple dry swallows after each bite/sip;Follow solids with liquid Postural Changes and/or Swallow Maneuvers: Seated upright 90 degrees;Upright 30-60 min after meal Oral Care Recommendations: Oral care BID Patient destination: Home Follow up Recommendations:  (TBD; do not anticipate further SLP needs upon  discharge) Equipment Recommended: None recommended by SLP    SLP Frequency 5 out of 7 days  SLP Treatment/Interventions Cognitive remediation/compensation;Cueing hierarchy;Dysphagia/aspiration precaution training;Environmental controls;Functional tasks;Internal/external aids;Medication managment;Patient/family education    Pain Pain Assessment Pain Assessment: No/denies pain Prior Functioning Cognitive/Linguistic Baseline: Within functional limits (pt reports mild difficulty with memory at baseline) Type of Home: House  Lives With: Alone Available Help at Discharge: Friend(s);Available PRN/intermittently Vocation: Retired  Industrial/product designer Term Goals: Week 1: SLP Short Term Goal 1 (Week 1): Pt will consume Dys 3 textures and thin liquids using safe swallowing strategies with Mod I SLP Short Term Goal 2 (Week 1): Pt will demonstrate effective mastication and oral clearance with regular textures with Mod I SLP Short Term Goal 3 (Week 1): Pt will demonstrate adequate medication management with use of external aids with Mod I SLP Short Term Goal 4 (Week 1): Pt will demonstrate emergent awareness of impairments during functional tasks with Mod I  See FIM for current functional status Refer to Care Plan for Long Term Goals  Recommendations for other services: None  Discharge Criteria: Patient will be discharged from SLP if patient refuses treatment 3 consecutive times without medical reason, if treatment goals not met, if there is a change in medical status, if patient makes no progress towards goals or if patient is discharged from hospital.  The above assessment, treatment plan, treatment alternatives and goals were discussed and mutually agreed upon: by patient   Germain Osgood, M.A. CCC-SLP [phone]   Germain Osgood 12/03/2013, 3:34 PM

## 2013-12-03 NOTE — Care Management Note (Signed)
[facility] Individual Statement of Services  Patient Name:  [name]  Date:  12/03/2013  Welcome to the [facility].  Our goal is to provide you with an individualized program based on your diagnosis and situation, designed to meet your specific needs.  With this comprehensive rehabilitation program, you will be expected to participate in at least 3 hours of rehabilitation therapies Monday-Friday, with modified therapy programming on the weekends.  Your rehabilitation program will include the following services:  Physical Therapy (PT), Occupational Therapy (OT), Speech Therapy (ST), 24 hour per day rehabilitation nursing, Case Management (Social Worker), Rehabilitation Medicine, Nutrition Services and Pharmacy Services  Weekly team conferences will be held on Wednesday  to discuss your progress.  Your Social Worker will talk with you frequently to get your input and to update you on team discussions.  Team conferences with you and your family in attendance may also be held.  Expected length of stay: 10-12 days Overall anticipated outcome: mod/i level  Depending on your progress and recovery, your program may change. Your Social Worker will coordinate services and will keep you informed of any changes. Your Social Worker's name and contact numbers are listed  below.  The following services may also be recommended but are not provided by the [facility] will be made to provide these services after discharge if needed.  Arrangements include referral to agencies that provide these services.  Your insurance has been verified to be:  Medicare & [place] Your primary doctor is:  Dr  Pertinent information will be shared with your doctor and your insurance company.  Social Worker:  Ovidio Kin, [employer] or ([phone]  Information discussed with and copy given to patient by: Elease Hashimoto, 12/03/2013, 1:41 PM

## 2013-12-03 NOTE — Progress Notes (Signed)
Occupational Therapy Session Note  Patient Details  Name: [name] MRN: [id number] Date of Birth: December 10, 1921  Today's Date: 12/03/2013 Time: 1405-1430 Time Calculation (min): 25 min  Short Term Goals: Week 1:  OT Short Term Goal 1 (Week 1): Pt will complete toilet transfer at supervision level with min cueing  OT Short Term Goal 2 (Week 1): Pt will complete LB dressing at supervision level with min verbal cues OT Short Term Goal 3 (Week 1): Pt will complete bathing task at supervision level with min cues for safety   Skilled Therapeutic Interventions/Progress Updates:    Pt seen for 1:1 OT session with focus on activity tolerance and functional transfers. Pt received in recliner chair requesting to go to bathroom. Ambulated to bathroom with min assist and no AD. Required min guard for standing balance for clothing management. Pt completed hand hygiene at sink then requiring rest break before ambulating to therapy gym. Pt fatigue once he reached gym and took rest break before returning back to room at min assist for balance. Pt with knowledge of ambulatory advice from PT requiring min cues to follow instructions. Pt left sitting in recliner chair with all needs in reach.   Therapy Documentation Precautions:  Precautions Precautions: Fall Precaution Comments: Poor safety awareness Restrictions Weight Bearing Restrictions: No General:   Vital Signs:   Pain: No report of pain during therapy session.    See FIM for current functional status  Therapy/Group: Individual Therapy  Duayne Cal 12/03/2013, 2:44 PM

## 2013-12-03 NOTE — Progress Notes (Signed)
Social Work Assessment and Plan Social Work Assessment and Plan  Patient Details  Name: [name] MRN: [id number] Date of Birth: [birth year]  Today's Date: 12/03/2013  Problem List:  Patient Active Problem List   Diagnosis Date Noted  . Atrial fibrillation with rapid ventricular response 11/29/2013  . CVA (cerebral infarction) 11/29/2013  . OTHER VITAMIN B12 DEFICIENCY ANEMIA 01/08/2008  . ANAL FISTULA 01/08/2008   Past Medical History:  Past Medical History  Diagnosis Date  . Hypertension   . Hyperlipidemia   . Atrial fibrillation    Past Surgical History:  Past Surgical History  Procedure Laterality Date  . Joint replacement     Social History:  reports that he has never smoked. He does not have any smokeless tobacco history on file. He reports that he does not drink alcohol or use illicit drugs.  Family / Support Systems Marital Status: Widow/Widower Patient Roles: Partner;Parent Spouse/Significant Other: Despina Hick friend  [phone]-home Children: Cheryl-daughter  lives in [place] and has gone back Other Supports: Junior Campbell-friend   Anticipated Caregiver: No clear caregiver at this time Ability/Limitations of Caregiver: Does not have one-daugther's have returned to [facility] Caregiver Availability: Intermittent Family Dynamics: Unclear relationship with pt and daughter's, they came to celebrate pt's 31th birthday but in the hospital and now have gone back home.  Pt feels he will be fine at home alone, very unrealistic and unaware of his deficits.  Friend here can stay with couple of days.  He seems to feel he will do well here.  Social History Preferred language: English Religion: Lutheran Cultural Background: No issues Education: High School Read: Yes Write: Yes Employment Status: Retired Freight forwarder Issues: No issues Guardian/Conservator: None-according to MD pt is capable of making his own decisions.  Will see the progress pt makes unsure  if will be safe to return home alone.     Abuse/Neglect Physical Abuse: Denies Verbal Abuse: Denies Sexual Abuse: Denies Exploitation of patient/patient's resources: Denies Self-Neglect: Denies  Emotional Status Pt's affect, behavior adn adjustment status: Pt is feeling he is in jail by all fo the hospital rules.  He wants to drink thin liquids and is not recommended to do this due to aspiration precautions.  He will do what he can but feels he is doing well and can go home soon.  He states; " I''m  80 years old, I can do for myself, always have." Recent Psychosocial Issues: Other medical issues-non-compliant with meds if he feels they are not good for him.  He is very concerned about the eliquis he is on and does not paln to take at home, feels it will kill him. Pyschiatric History: No history-deferred depression screen due to friend here to visit.  He appears to be doing well in his own little world of everything is ok and I'm fine.  Will try to see one on one and see if concerns. Substance Abuse History: No issues  Patient / Family Perceptions, Expectations & Goals Pt/Family understanding of illness & functional limitations: Pt and friend can state he had a stroke but are not aware of his deficits or want to acknowledge them.  Pt plans to return home even if team recommends otherwise.  Pt had previous issues with swallowing and urgency with bladder.  He feels these were managed. Premorbid pt/family roles/activities: Father, Friend, Male friend, etc Anticipated changes in roles/activities/participation: resume Pt/family expectations/goals: Pt states; ' I am [age] I will manage, you all worry too much."  Friend states: "  He is something always has been, but he wil do well."  [employer]: Other (Comment) (3-4 years ago went to [employer] after [facility]) Premorbid Home Care/DME Agencies: None Transportation available at discharge: Pt thinks it will be him-told can not  drive at discharge.  Friend to help with this Resource referrals recommended: Support group (specify) ([facility] SUpport group)  Discharge Planning Living Arrangements: Alone Support Systems: Children;Friends/neighbors;Church/faith community Type of Residence: Private residence Insurance Resources: [place] (specify) Sports administrator) Financial Resources: Social Security Financial Screen Referred: No Living Expenses: Own Money Management: Patient Does the patient have any problems obtaining your medications?: No Home Management: Patient Patient/Family Preliminary Plans: Pt plans to return home no matter what team says or MD.  He feels he is in jail and would like to get out soon.  Unsure if uses humor to cover his deficits or is aware of this.  Will try to speak with daughter's but will have to get their numbers.  Firend is willing to stay a few days with and transport home from here. Social Work Anticipated Follow Up Needs: HH/OP;SNF;Support Group  Clinical Impression Pleasant gentleman who lacks awareness of his deficits and plans to go home alone no matter what.  He is stubborn and does what he feels is best for him, friend backs this up. His daughter's have already returned to [place] and not left their numbers.  Will try to contact one of them to express concerns regarding going home alone.  Will work toward a safe discharge plan And await teams' evaluations.  Elease Hashimoto 12/03/2013, 1:38 PM

## 2013-12-03 NOTE — Progress Notes (Addendum)
         Subjective/Complaints: Had a good night for the most part. A little anxious about not being able to get up on his own. Denies pain. Had one night mare last night A 12 point review of systems has been performed and if not noted above is otherwise negative.   Objective: Vital Signs: Blood pressure 121/68, pulse 64, temperature 98.2 F (36.8 C), temperature source Oral, resp. rate 17, SpO2 94.00%. No results found.  Recent Labs  12/03/13 0415  WBC 8.4  HGB 12.8*  HCT 37.9*  PLT 258    Recent Labs  12/03/13 0415  NA 139  K 4.2  CL 104  GLUCOSE 98  BUN 27*  CREATININE 1.33  CALCIUM 9.1   CBG (last 3)  No results found for this basename: GLUCAP,  in the last 72 hours  Wt Readings from Last 3 Encounters:  11/30/13 66 kg (145 lb 8.1 oz)    Physical Exam:  Constitutional: He is oriented to person, place, and time. He appears well-developed.  HENT:  Head: Normocephalic.  Eyes: EOM are normal.  Neck: Normal range of motion. Neck supple. No thyromegaly present.  Cardiovascular:  Cardiac rate controlled  Respiratory: Effort normal and breath sounds normal. No respiratory distress.  GI: Soft. Bowel sounds are normal. He exhibits no distension. Musc: chronic deformity of left clavicle.  Neurological: He is alert and oriented to person, place, and time.  Speech is  intelligible. He follows full commands. Mild left central 7 and tongue deviation. Attends well to the left. [facility] still decreased left. No limb ataxia. No pronator drift. RUE 5/5. LUE 4 to 4+/5. RLE 4/5 prox to distal. LLE 4-/5 prox to distal. Good sitting balance. No obvious sensory loss. DTR's 1+. Cognition generally appropriate. A little impulsive.  Skin: Skin is warm and dry.  Psychiatric:  Pleasant and in good spirits    Assessment/Plan: 1. Functional deficits secondary to left posterior inferior MCA branch infarct which require 3+ hours per day of interdisciplinary therapy in a comprehensive  inpatient rehab setting. Physiatrist is providing close team supervision and 24 hour management of active medical problems listed below. Physiatrist and rehab team continue to assess barriers to discharge/monitor patient progress toward functional and medical goals. FIM:                   Comprehension Comprehension Mode: Auditory Comprehension: 5-Follows basic conversation/direction: With no assist  Expression Expression Mode: Verbal Expression: 6-Expresses complex ideas: With extra time/assistive device  Social Interaction Social Interaction: 7-Interacts appropriately with others - No medications needed.  Problem Solving Problem Solving: 7-Solves complex problems: Recognizes & self-corrects  Memory Memory: 6-More than reasonable amt of time  Medical Problem List and Plan:  1. Embolic right MCA infarct  2. DVT Prophylaxis/Anticoagulation: Eliquis  3. Pain Management: Tylenol as needed  4. Neuropsych: This patient is capable of making decisions on his own behalf.  5. Dysphagia. Dysphagia 3 nectar liquids. A little dry on today's labs---encourage fluids  -recheck bmet in AM Saturday 6. Atrial fibrillation. Cardizem CD 180 mg daily, Lopressor 25 mg twice a day and amiodarone 400 mg twice a day. Will discuss with cardiology on plan to reduce amiodarone to 200 mg daily upon discharge  7. Hyperlipidemia. Lipitor    LOS (Days) 1 A FACE TO FACE EVALUATION WAS PERFORMED  SWARTZ,ZACHARY T 12/03/2013 8:26 AM
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
## 2013-12-03 NOTE — Progress Notes (Signed)
Patient information reviewed and entered into eRehab system by Dru Laurel, RN, CRRN, PPS Coordinator.  Information including medical coding and functional independence measure will be reviewed and updated through discharge.     Per nursing patient was given "Data Collection Information Summary for Patients in Inpatient Rehabilitation Facilities with attached "Privacy Act Statement-Health Care Records" upon admission.  

## 2013-12-04 ENCOUNTER — Inpatient Hospital Stay (HOSPITAL_COMMUNITY): Payer: Medicare Other

## 2013-12-04 ENCOUNTER — Inpatient Hospital Stay (HOSPITAL_COMMUNITY): Payer: Medicare Other | Admitting: Speech Pathology

## 2013-12-04 ENCOUNTER — Inpatient Hospital Stay (HOSPITAL_COMMUNITY): Payer: Medicare Other | Admitting: Physical Therapy

## 2013-12-04 DIAGNOSIS — I4891 Unspecified atrial fibrillation: Secondary | ICD-10-CM

## 2013-12-04 DIAGNOSIS — D518 Other vitamin B12 deficiency anemias: Secondary | ICD-10-CM

## 2013-12-04 DIAGNOSIS — I6350 Cerebral infarction due to unspecified occlusion or stenosis of unspecified cerebral artery: Secondary | ICD-10-CM

## 2013-12-04 LAB — BASIC METABOLIC PANEL
BUN: 27 mg/dL — ABNORMAL HIGH (ref 6–23)
CO2: 23 mEq/L (ref 19–32)
Calcium: 8.8 mg/dL (ref 8.4–10.5)
Chloride: 104 mEq/L (ref 96–112)
Creatinine, Ser: 1.44 mg/dL — ABNORMAL HIGH (ref 0.50–1.35)
GFR calc Af Amer: 48 mL/min — ABNORMAL LOW (ref >90)
GFR calc non Af Amer: 41 mL/min — ABNORMAL LOW (ref >90)
Glucose, Bld: 88 mg/dL (ref 70–99)
Potassium: 4.5 mEq/L (ref 3.7–5.3)
Sodium: 139 mEq/L (ref 137–147)

## 2013-12-04 NOTE — Progress Notes (Signed)
Physical Therapy Note  Patient Details  Name: [name] MRN: [id number] Date of Birth: 1922-08-02 Today's Date: 12/04/2013  1100-1155 (55 minutes) individual Pain: no complaint of pain Focus of treatment: gait training; therapeutic exercise focused on bilateral LE strengthening; standing balance Treatment: gait 120 feet X 1 without AD close SBA to min assist for balance with stagger to right) ; gait 120 feet X 2 SPC (on right) close SBA with mod vcs for sequence; stepping over obstacle min assist X 15 reps; up/down 6 inch steps with bilateral rail support alternating LEs for quad strengthening ; returned to room with all needs within reach  step . Lajune Perine,JIM 12/04/2013, 11:12 AM

## 2013-12-04 NOTE — Progress Notes (Signed)
Occupational Therapy Session Note  Patient Details  Name: [name] MRN: [id number] Date of Birth: Oct 21, 1922  Today's Date: 12/04/2013 Time: [phone] and [phone] Time Calculation (min): 45 min 45 min   Short Term Goals: Week 1:  OT Short Term Goal 1 (Week 1): Pt will complete toilet transfer at supervision level with min cueing  OT Short Term Goal 2 (Week 1): Pt will complete LB dressing at supervision level with min verbal cues OT Short Term Goal 3 (Week 1): Pt will complete bathing task at supervision level with min cues for safety   Skilled Therapeutic Interventions/Progress Updates:    Session 1: Pt seen for 1:1 OT session with focus on safety awareness, dynamic standing balance, and activity tolerance. Pt declining bathing this AM stating his "lady friend is bringing my clothes later." Donned shoes sitting EOB then ambulated to gym with min guard-supervision. Engaged in obstacle course using foam cushion to step onto and another to step over before reaching to floor to retrieve item. Pt completed 5x with min guard-supervision. Engaged in dynamic standing balance task of standing on foam cushion and tossing ball at trampoline 10-12x for 3 sets. Pt required min assist for standing balance. Engaged in NuStep x10 min workload 6 with 1 rest break. Educated on breathing techniques as pt SOB. SpO2 >95% and HR 61-67. At end of session pt returned to room and left with all needs in reach.   Session 2: Pt seen for ADL retraining with focus safety awareness, activity tolerance, and dynamic standing balance. Pt received sitting in recliner chair. Pt ambulated to bathroom at supervision level with no AD. Pt attempting to doff LB clothing in standing requiring mod cueing to sit for task, pt continued to complete in standing until having too much difficulty then sat to complete. Pt required cueing for sitting to dry most of body as he was fatiguing quickly and discussed increased fall risk with this  technique. Completed dressing with no carryover to sit for threading BLE into pants requiring 3-4 cues to do so. Pt fatiguing throughout session requiring cues to take rest breaks. Pt supervision for all dynamic standing throughout session. At end of session, pt had friends present and requesting to end therapy so he could visit with them. Pt left in recliner chair with all needs in reach.   Therapy Documentation Precautions:  Precautions Precautions: Fall Precaution Comments: Poor safety awareness Restrictions Weight Bearing Restrictions: No General:   Vital Signs:   Pain: No report of pain during therapy sessions.   See FIM for current functional status  Therapy/Group: Individual Therapy  Shayleen Eppinger, Quillian Quince 12/04/2013, 12:12 PM

## 2013-12-04 NOTE — Progress Notes (Addendum)
[name] is a 79 y.o. male 1922/04/20 [id number]  Subjective: No new complaints. No new problems. Slept well. Feeling OK. Eating breakfast  Objective: Vital signs in last 24 hours: Temp:  [97.2 F (36.2 C)-97.9 F (36.6 C)] 97.9 F (36.6 C) (02/07 0500) Pulse Rate:  [51-84] 51 (02/07 0500) Resp:  [14-16] 16 (02/07 0500) BP: (106-127)/(61-71) 127/71 mmHg (02/07 0500) SpO2:  [95 %-97 %] 97 % (02/07 0500) Weight change:  Last BM Date: 12/03/13  Intake/Output from previous day: 02/06 0701 - 02/07 0700 In: 600 [P.O.:600] Out: 175 [Urine:175] Last cbgs: CBG (last 3)  No results found for this basename: GLUCAP,  in the last 72 hours   Physical Exam General: No apparent distress   Lungs: Normal effort. Lungs clear to auscultation, no crackles or wheezes. Cardiovascular: Irregular rate and rhythm, no edema Abdomen: S/NT/ND; BS(+) Musculoskeletal:  unchanged Neurological: No new neurological deficits Wounds: dressing: N/A    A/o/c    Lab Results: BMET    Component Value Date/Time   NA 139 12/04/2013 0530   K 4.5 12/04/2013 0530   CL 104 12/04/2013 0530   CO2 23 12/04/2013 0530   GLUCOSE 88 12/04/2013 0530   BUN 27* 12/04/2013 0530   CREATININE 1.44* 12/04/2013 0530   CALCIUM 8.8 12/04/2013 0530   GFRNONAA 41* 12/04/2013 0530   GFRAA 48* 12/04/2013 0530   CBC    Component Value Date/Time   WBC 8.4 12/03/2013 0415   RBC 4.01* 12/03/2013 0415   HGB 12.8* 12/03/2013 0415   HCT 37.9* 12/03/2013 0415   PLT 258 12/03/2013 0415   MCV 94.5 12/03/2013 0415   MCH 31.9 12/03/2013 0415   MCHC 33.8 12/03/2013 0415   RDW 13.0 12/03/2013 0415   LYMPHSABS 2.4 12/03/2013 0415   MONOABS 1.1* 12/03/2013 0415   EOSABS 0.3 12/03/2013 0415   BASOSABS 0.0 12/03/2013 0415    Studies/Results: No results found.  Medications: I have reviewed the patient's current medications.  Assessment/Plan:  1. Embolic right MCA infarct  2. DVT Prophylaxis/Anticoagulation: Eliquis  3. Pain Management: Tylenol as needed  4.  Neuropsych: This patient is capable of making decisions on his own behalf.  5. Dysphagia. Dysphagia 3 nectar liquids. A little dry on today's labs---encourage fluids  -recheck bmet in AM Saturday  6. Atrial fibrillation. Cardizem CD 180 mg daily, Lopressor 25 mg twice a day and amiodarone 400 mg twice a day. Will discuss with cardiology on plan to reduce amiodarone to 200 mg daily upon discharge  7. Hyperlipidemia. Lipitor   Cont Rx     Length of stay, days: 2  Walker Kehr , MD 12/04/2013, 8:40 AM

## 2013-12-04 NOTE — Progress Notes (Signed)
Speech Language Pathology Daily Session Note  Patient Details  Name: [name] MRN: [id number] Date of Birth: 1922/01/01  Today's Date: 12/04/2013 Time: 9292-4462 Time Calculation (min): 40 min  Short Term Goals: Week 1: SLP Short Term Goal 1 (Week 1): Pt will consume Dys 3 textures and thin liquids using safe swallowing strategies with Mod I SLP Short Term Goal 2 (Week 1): Pt will demonstrate effective mastication and oral clearance with regular textures with Mod I SLP Short Term Goal 3 (Week 1): Pt will demonstrate adequate medication management with use of external aids with Mod I SLP Short Term Goal 4 (Week 1): Pt will demonstrate emergent awareness of impairments during functional tasks with Mod I  Skilled Therapeutic Interventions: Skilled intervention complete with short term goals addressed.  The patient has decreased labial sensation and reqjuired multiple liquid washes to push bolus posteriorly.  He has moderate residue that decreased to minimal with liquid wash.   However, he did  not have cough or throat clear with DIII or thins via straw.  Continue with current treattment.     FIM:  FIM - Eating Eating Activity: 5: Needs verbal cues/supervision  Pain Pain Assessment Pain Assessment: No/denies pain  Therapy/Group: Individual Therapy  Frances Maywood 12/04/2013, 8:41 AM

## 2013-12-04 NOTE — Plan of Care (Signed)
Problem: RH BLADDER ELIMINATION Goal: RH STG MANAGE BLADDER WITH ASSISTANCE STG Manage Bladder With mod I Assistance  Outcome: Progressing Cont with urinal at HS, toilet during day

## 2013-12-05 NOTE — Progress Notes (Addendum)
[name] is a 80 y.o. male May 03, 1922 [id number]  Subjective: No new complaints. No new problems. Slept well. Feeling OK. Finishing breakfast  Objective: Vital signs in last 24 hours: Temp:  [97.4 F (36.3 C)-97.9 F (36.6 C)] 97.9 F (36.6 C) (02/08 0600) Pulse Rate:  [54-71] 71 (02/08 0600) Resp:  [16-17] 16 (02/08 0600) BP: (117-126)/(57-78) 126/78 mmHg (02/08 0600) SpO2:  [94 %-95 %] 95 % (02/08 0600) Weight change:  Last BM Date: 12/04/13  Intake/Output from previous day: 02/07 0701 - 02/08 0700 In: 1080 [P.O.:1080] Out: 325 [Urine:325] Last cbgs: CBG (last 3)  No results found for this basename: GLUCAP,  in the last 72 hours   Physical Exam General: No apparent distress   Lungs: Normal effort. Lungs clear to auscultation, no crackles or wheezes. Cardiovascular: Irregular rate and rhythm, no edema Abdomen: S/NT/ND; BS(+) Musculoskeletal:  As before Neurological: No new neurological deficits Wounds: N/A    A/o/c    Lab Results: BMET    Component Value Date/Time   NA 139 12/04/2013 0530   K 4.5 12/04/2013 0530   CL 104 12/04/2013 0530   CO2 23 12/04/2013 0530   GLUCOSE 88 12/04/2013 0530   BUN 27* 12/04/2013 0530   CREATININE 1.44* 12/04/2013 0530   CALCIUM 8.8 12/04/2013 0530   GFRNONAA 41* 12/04/2013 0530   GFRAA 48* 12/04/2013 0530   CBC    Component Value Date/Time   WBC 8.4 12/03/2013 0415   RBC 4.01* 12/03/2013 0415   HGB 12.8* 12/03/2013 0415   HCT 37.9* 12/03/2013 0415   PLT 258 12/03/2013 0415   MCV 94.5 12/03/2013 0415   MCH 31.9 12/03/2013 0415   MCHC 33.8 12/03/2013 0415   RDW 13.0 12/03/2013 0415   LYMPHSABS 2.4 12/03/2013 0415   MONOABS 1.1* 12/03/2013 0415   EOSABS 0.3 12/03/2013 0415   BASOSABS 0.0 12/03/2013 0415    Studies/Results: No results found.  Medications: I have reviewed the patient's current medications.  Assessment/Plan:  1. Embolic right MCA infarct  2. DVT Prophylaxis/Anticoagulation: Eliquis  3. Pain Management: Tylenol as needed  4.  Neuropsych: This patient is capable of making decisions on his own behalf.  5. Dysphagia. Dysphagia 3 nectar liquids. A little dry on today's labs---encourage fluids  -recheck bmet in AM Saturday  6. Atrial fibrillation. Cardizem CD 180 mg daily, Lopressor 25 mg twice a day and amiodarone 400 mg twice a day. Will discuss with cardiology on plan to reduce amiodarone to 200 mg daily upon discharge  7. Hyperlipidemia. Lipitor   Cont Rx as before     Length of stay, days: 3  Walker Kehr , MD 12/05/2013, 8:16 AM

## 2013-12-05 NOTE — IPOC Note (Signed)
Overall Plan of Care [facility]) Patient Details Name: [name] MRN: [id number] DOB: Oct 12, 1922  Admitting Diagnosis: R MCA  Hospital Problems: Active Problems:   CVA (cerebral infarction)     Functional Problem List: Nursing Motor;Safety;Medication Management;Bladder;Bowel  PT Balance;Safety;Endurance;Motor  OT Balance;Cognition;Endurance;Motor;Safety  SLP Cognition  TR         Basic ADL's: OT Grooming;Bathing;Dressing;Toileting     Advanced  ADL's: OT Simple Meal Preparation;Laundry     Transfers: PT Bed Mobility;Bed to Chair;Car  OT       Locomotion: PT Ambulation;Stairs     Additional Impairments: OT None  SLP Swallowing;Social Cognition   Awareness;Problem Solving  TR      Anticipated Outcomes Item Anticipated Outcome  Self Feeding Mod I  Swallowing  Mod I with least restrictive PO   Basic self-care  Mod I  Toileting  Mod I    Bathroom Transfers Mod I  Bowel/Bladder  Patient will ask for assistance with toileting.  Transfers  Mod I  Locomotion  Mod I  Communication     Cognition  Mod I   Pain  Patient will have 2/10 pain.  Safety/Judgment  Patient will ask for assistance and use call light.   Therapy Plan: PT Intensity: Minimum of 1-2 x/day ,45 to 90 minutes PT Frequency: 5 out of 7 days PT Duration Estimated Length of Stay: 10-12 days OT Intensity: Minimum of 1-2 x/day, 45 to 90 minutes OT Frequency: 5 out of 7 days OT Duration/Estimated Length of Stay: 10-12 days SLP Intensity: Minumum of 1-2 x/day, 30 to 90 minutes SLP Frequency: 5 out of 7 days SLP Duration/Estimated Length of Stay: 7-10 days       Team Interventions: Nursing Interventions Patient/Family Education;Medication Management;Psychosocial Support;Disease Management/Prevention  PT interventions Ambulation/gait training;Balance/vestibular training;Discharge planning;Stair training;Therapeutic Activities;Therapeutic Exercise;UE/LE Strength taining/ROM;Patient/family  education;Functional mobility training;Disease management/prevention;Neuromuscular re-education;DME/adaptive equipment instruction;Psychosocial support  OT Interventions Balance/vestibular training;Cognitive remediation/compensation;Discharge planning;Community reintegration;DME/adaptive equipment instruction;Functional electrical stimulation;Neuromuscular re-education;Psychosocial support;Patient/family education;Self Care/advanced ADL retraining;Therapeutic Exercise;Therapeutic Activities;UE/LE Strength taining/ROM;UE/LE Coordination activities  SLP Interventions Cognitive remediation/compensation;Cueing hierarchy;Dysphagia/aspiration precaution training;Environmental controls;Functional tasks;Internal/external aids;Medication managment;Patient/family education  TR Interventions    SW/CM Interventions Discharge Planning;Psychosocial Support;Patient/Family Education    Team Discharge Planning: Destination: PT-Home ,OT- Home , SLP-Home Projected Follow-up: PT-Home health PT, OT-  Home health OT, SLP- (TBD; do not anticipate further SLP needs upon discharge) Projected Equipment Needs: PT-To be determined, OT- None recommended by OT, SLP-None recommended by SLP Equipment Details: PT-Pt does not own any personal assistive devices., OT-  Patient/family involved in discharge planning: PT- Patient,  OT-Patient, SLP-Patient  MD ELOS: 10-12 days Medical Rehab Prognosis:  Excellent Assessment: The patient has been admitted for CIR therapies. The team will be addressing, functional mobility, strength, stamina, balance, safety, adaptive techniques/equipment, self-care, bowel and bladder mgt, patient and caregiver education, NMR, visual-spatial awareness, ego-support. Goals have been set at mod I for mobility and self-care.    Meredith Staggers, MD, FAAPMR      See Team Conference Notes for weekly updates to the plan of care

## 2013-12-05 NOTE — IPOC Note (Signed)
Overall Plan of Care [facility]) Patient Details Name: [name] MRN: [id number] DOB: 1922/07/21  Admitting Diagnosis: R MCA  Hospital Problems: Active Problems:   CVA (cerebral infarction)     Functional Problem List: Nursing Motor;Safety;Medication Management;Bladder;Bowel  PT Balance;Safety;Endurance;Motor  OT Balance;Cognition;Endurance;Motor;Safety  SLP Cognition  TR         Basic ADL's: OT Grooming;Bathing;Dressing;Toileting     Advanced  ADL's: OT Simple Meal Preparation;Laundry     Transfers: PT Bed Mobility;Bed to Chair;Car  OT       Locomotion: PT Ambulation;Stairs     Additional Impairments: OT None  SLP Swallowing;Social Cognition   Awareness;Problem Solving  TR      Anticipated Outcomes Item Anticipated Outcome  Self Feeding Mod I  Swallowing  Mod I with least restrictive PO   Basic self-care  Mod I  Toileting  Mod I    Bathroom Transfers Mod I  Bowel/Bladder  Patient will ask for assistance with toileting.  Transfers  Mod I  Locomotion  Mod I  Communication     Cognition  Mod I   Pain  Patient will have 2/10 pain.  Safety/Judgment  Patient will ask for assistance and use call light.   Therapy Plan: PT Intensity: Minimum of 1-2 x/day ,45 to 90 minutes PT Frequency: 5 out of 7 days PT Duration Estimated Length of Stay: 10-12 days OT Intensity: Minimum of 1-2 x/day, 45 to 90 minutes OT Frequency: 5 out of 7 days OT Duration/Estimated Length of Stay: 10-12 days SLP Intensity: Minumum of 1-2 x/day, 30 to 90 minutes SLP Frequency: 5 out of 7 days SLP Duration/Estimated Length of Stay: 7-10 days       Team Interventions: Nursing Interventions Patient/Family Education;Medication Management;Psychosocial Support;Disease Management/Prevention  PT interventions Ambulation/gait training;Balance/vestibular training;Discharge planning;Stair training;Therapeutic Activities;Therapeutic Exercise;UE/LE Strength taining/ROM;Patient/family  education;Functional mobility training;Disease management/prevention;Neuromuscular re-education;DME/adaptive equipment instruction;Psychosocial support  OT Interventions Balance/vestibular training;Cognitive remediation/compensation;Discharge planning;Community reintegration;DME/adaptive equipment instruction;Functional electrical stimulation;Neuromuscular re-education;Psychosocial support;Patient/family education;Self Care/advanced ADL retraining;Therapeutic Exercise;Therapeutic Activities;UE/LE Strength taining/ROM;UE/LE Coordination activities  SLP Interventions Cognitive remediation/compensation;Cueing hierarchy;Dysphagia/aspiration precaution training;Environmental controls;Functional tasks;Internal/external aids;Medication managment;Patient/family education  TR Interventions    SW/CM Interventions Discharge Planning;Psychosocial Support;Patient/Family Education    Team Discharge Planning: Destination: PT-Home ,OT- Home , SLP-Home Projected Follow-up: PT-Home health PT, OT-  Home health OT, SLP- (TBD; do not anticipate further SLP needs upon discharge) Projected Equipment Needs: PT-To be determined, OT- None recommended by OT, SLP-None recommended by SLP Equipment Details: PT-Pt does not own any personal assistive devices., OT-  Patient/family involved in discharge planning: PT- Patient,  OT-Patient, SLP-Patient  MD ELOS: 7-10d Medical Rehab Prognosis:  Good Assessment: 80 y.o. right-handed male with history of hypertension, atrial fibrillation as well as CVA 2009 with little residual. Patient independent living alone and driving prior to admission. Admitted 11/29/2013 with generalized weakness x2 days after being found in the floor by a friend. MRI of the brain showed large posterior and inferior right MCA division infarct without hemorrhage. MRA of the brain with focal high grade stenosis of the posterior inferior right M2 division. Carotid Dopplers with no ICA stenosis. Echocardiogram with  ejection fraction of 55% with normal systolic function. Patient did not receive TPA. Neurology services consulted maintained on aspirin for CVA prophylaxis     See Team Conference Notes for weekly updates to the plan of care

## 2013-12-06 ENCOUNTER — Inpatient Hospital Stay (HOSPITAL_COMMUNITY): Payer: Medicare Other | Admitting: Physical Therapy

## 2013-12-06 ENCOUNTER — Inpatient Hospital Stay (HOSPITAL_COMMUNITY): Payer: Medicare Other

## 2013-12-06 ENCOUNTER — Inpatient Hospital Stay (HOSPITAL_COMMUNITY): Payer: Medicare Other | Admitting: Speech Pathology

## 2013-12-06 DIAGNOSIS — I6340 Cerebral infarction due to embolism of unspecified cerebral artery: Secondary | ICD-10-CM

## 2013-12-06 DIAGNOSIS — G8110 Spastic hemiplegia affecting unspecified side: Secondary | ICD-10-CM

## 2013-12-06 NOTE — Progress Notes (Signed)
Occupational Therapy Session Note  Patient Details  Name: [name] MRN: [id number] Date of Birth: 07-Feb-1922  Today's Date: 12/06/2013 Time: 0930-1026 Time Calculation (min): 56 min  Short Term Goals: Week 1:  OT Short Term Goal 1 (Week 1): Pt will complete toilet transfer at supervision level with min cueing  OT Short Term Goal 2 (Week 1): Pt will complete LB dressing at supervision level with min verbal cues OT Short Term Goal 3 (Week 1): Pt will complete bathing task at supervision level with min cues for safety   Skilled Therapeutic Interventions/Progress Updates:    Pt seen for ADL retraining with focus on activity tolerance, safety awareness, selective attention, problem solving, and dynamic standing balance. Pt received supine in bed. Discussed safety concerns and energy conservation techniques at beginning of session to practice carryover during functional tasks. Pt completed all self-care tasks at supervision level. Pt demonstrating improved carryover of sitting for drying and dressing LB as compared to this weekend. Pt required mod cueing for rest breaks d/t SOB and little carryover. Pt with decreased problem solving when having difficulty donning shoes d/t dresser in way, requiring cues to reposition body on bed. Pt with decreased problem solving donning front closure shirt as he was having difficulty managing over undershirt requiring min assist to correct. Pt demonstrating selective attention with min cueing throughout self-care tasks.Pt ambulated to laundry and completed laundry task at Mod I level with min cues to orient to washing machine as it was different from his own. At end of session pt left sitting in recliner chair with all needs in reach.    Therapy Documentation Precautions:  Precautions Precautions: Fall Precaution Comments: Poor safety awareness Restrictions Weight Bearing Restrictions: No General:   Vital Signs: Therapy Vitals Pulse Rate: 64 BP: 124/76  mmHg Pain: Pain Assessment Pain Assessment: No/denies pain  See FIM for current functional status  Therapy/Group: Individual Therapy  Valarie Farace N 12/06/2013, 10:30 AM

## 2013-12-06 NOTE — Progress Notes (Signed)
Physical Therapy Session Note  Patient Details  Name: [name] MRN: [id number] Date of Birth: 08-05-1922  Today's Date: 12/06/2013 Time: 9937-1696 Time Calculation (min): 54 min  Short Term Goals: Week 1:  PT Short Term Goal 1 (Week 1): Pt to perform supine<>sit with mod I. PT Short Term Goal 2 (Week 1): Pt to perform bed<>chair transfer with supervision. PT Short Term Goal 3 (Week 1): Pt to perform gait x150' in controlled environment with supervision. PT Short Term Goal 4 (Week 1): Pt to negotiate 4 stairs without rails with supervision to progress toward pt safety entering home.  Skilled Therapeutic Interventions/Progress Updates:    Pt received seated in bedside chair; agreeable to therapy. Session focused on assessing/addressing gait stability in community environment, initiating car transfer and stair negotiation without rails to simulate primary entrance of home. Performed negotiation of 9 stairs without rails, forward-facing, step-to pattern, supervision, and cueing for sequencing. Pt with inconsistent carryover of proper sequencing with stair negotiation. Car transfer x3 trials total with close supervision. Initial transfer with poor safety awareness, pt attempting to utilize car door for UE support/ Therapist provided explanation, demonstration of proper technique, hand placement, and rationale. Pt then performed transfer with close supervision and effective return demonstration of technique. Re-attempted transfer 15 minutes later with ineffective within-session carryover of technique, effective carryover of safe hand placement.  Pt performed gait x250' in controlled and community environments with close supervision in controlled environment, min guard to min A in community environment. In community environment, pt requires min A to recover from LOB's to R side x3 episodes, all of which occurred with head turning. Emotional lability noted x4 episodes during session when pt talking  about late wife and dog. Returned to room, where pt was left seated in bedside chair with all needs within reach. RN notified of pt location, fact that pt was drinking water upon PT departure. RN states she will check on pt intermittently due to swallowing precautions.   Therapy Documentation Precautions:  Precautions Precautions: Fall Precaution Comments: Poor safety awareness Restrictions Weight Bearing Restrictions: No Vital Signs: Therapy Vitals Temp: 97.9 F (36.6 C) Temp src: Oral Pulse Rate: 48 Resp: 17 BP: 124/69 mmHg Patient Position, if appropriate: Sitting Oxygen Therapy SpO2: 94 % O2 Device: None (Room air) Pain: Pain Assessment Pain Assessment: No/denies pain Locomotion : Ambulation Ambulation/Gait Assistance: 4: Min assist   See FIM for current functional status  Therapy/Group: Individual Therapy  Zymir Napoli, Malva Cogan 12/06/2013, 3:48 PM

## 2013-12-06 NOTE — Progress Notes (Signed)
Occupational Therapy Note  Patient Details  Name: WORTHINGTON CRUZAN MRN: [id number] Date of Birth: Oct 18, 1922 Today's Date: 12/06/2013 Time: 1130-1200 (30 mins)  Pt seen for group session, [employer], with SLP with focus on increased safety awareness and attention to task during self-feeding.  Min verbal cues for not talking with food in oral cavity, checking oral cavity for residue and utilizing liquid washes to clear reside and attending to left anterior spillage.  Pt with one instance of increased coughing on sandwich, requiring cues to clear throat.  Pt easily distracted by peers at table as well as those walking through treatment area, requiring cues to return focus to self-feeding. Pt with appropriate interaction and PO intake this session.  Pt with no c/o pain  Abree Romick, [facility] 12/06/2013, 2:44 PM

## 2013-12-06 NOTE — Progress Notes (Signed)
80 y.o. right-handed male with history of hypertension, atrial fibrillation as well as CVA 2009 with little residual. Patient independent living alone and driving prior to admission. Admitted 11/29/2013 with generalized weakness x2 days after being found in the floor by a friend. MRI of the brain showed large posterior and inferior right MCA division infarct without hemorrhage. MRA of the brain with focal high grade stenosis of the posterior inferior right M2 division. Carotid Dopplers with no ICA stenosis. Echocardiogram with ejection fraction of 55% with normal systolic function. Patient did not receive TPA. Neurology services consulted maintained on aspirin for CVA prophylaxis. Followup cardiology services(Dr. Einar Gip) for atrial fibrillation with rapid ventricular response maintained on amiodarone/ Cardizem and metoprolol And started on eliquis 12/01/2013 for atrial fibrillation. Currently on dysphagia 3 nectar thick liquid diet   Subjective/Complaints: No c/o, looking fwd to therapy. Gives history of Left shoulder injury and B TKR Review of Systems - Negative except left shoulder weakness, decreased ROM both shoulders Objective: Vital Signs: Blood pressure 118/52, pulse 51, temperature 97.7 F (36.5 C), temperature source Oral, resp. rate 16, SpO2 97.00%. No results found. Results for orders placed during the hospital encounter of 12/02/13 (from the past 72 hour(s))  BASIC METABOLIC PANEL     Status: Abnormal   Collection Time    12/04/13  5:30 AM      Result Value Range   Sodium 139  137 - 147 mEq/L   Potassium 4.5  3.7 - 5.3 mEq/L   Chloride 104  96 - 112 mEq/L   CO2 23  19 - 32 mEq/L   Glucose, Bld 88  70 - 99 mg/dL   BUN 27 (*) 6 - 23 mg/dL   Creatinine, Ser 1.44 (*) 0.50 - 1.35 mg/dL   Calcium 8.8  8.4 - 10.5 mg/dL   GFR calc non Af Amer 41 (*) >90 mL/min   GFR calc Af Amer 48 (*) >90 mL/min   Comment: (NOTE)     The eGFR has been calculated using the CKD EPI equation.     This  calculation has not been validated in all clinical situations.     eGFR's persistently <90 mL/min signify possible Chronic Kidney     Disease.     HEENT: normal Cardio: irregular and no murmur Resp: CTA B/L and unlabored GI: BS positive and non distended Extremity:  Pulses positive and No Edema Skin:   Intact Neuro: Alert/Oriented, Cranial Nerve II-XII normal, Abnormal Motor 4/5 LUE otherwise 5/5 and Abnormal FMC Ataxic/ dec FMC Musc/Skel:  Other Left Shoulder sublux, Bilatera AROM shoulder abd 80 degrees Gen NAD   Assessment/Plan: 1. Functional deficits secondary to embolic R MCA infarct which require 3+ hours per day of interdisciplinary therapy in a comprehensive inpatient rehab setting. Physiatrist is providing close team supervision and 24 hour management of active medical problems listed below. Physiatrist and rehab team continue to assess barriers to discharge/monitor patient progress toward functional and medical goals. FIM: FIM - Bathing Bathing Steps Patient Completed: Chest;Front perineal area;Right lower leg (including foot);Left lower leg (including foot);Buttocks;Right Arm;Left Arm;Right upper leg;Left upper leg;Abdomen Bathing: 5: Supervision: Safety issues/verbal cues  FIM - Upper Body Dressing/Undressing Upper body dressing/undressing steps patient completed: Thread/unthread right sleeve of pullover shirt/dresss;Thread/unthread left sleeve of pullover shirt/dress;Pull shirt over trunk;Put head through opening of pull over shirt/dress Upper body dressing/undressing: 5: Set-up assist to: Obtain clothing/put away FIM - Lower Body Dressing/Undressing Lower body dressing/undressing steps patient completed: Thread/unthread right underwear leg;Thread/unthread left underwear leg;Thread/unthread right pants  leg;Thread/unthread left pants leg;Pull pants up/down;Don/Doff right shoe;Don/Doff left shoe;Fasten/unfasten left shoe;Fasten/unfasten right shoe;Pull underwear  up/down;Don/Doff right sock;Don/Doff left sock Lower body dressing/undressing: 5: Set-up assist to: Don/Doff TED stocking  FIM - Toileting Toileting steps completed by patient: Adjust clothing prior to toileting;Performs perineal hygiene;Adjust clothing after toileting Toileting Assistive Devices: Grab bar or rail for support Toileting: 5: Supervision: Safety issues/verbal cues  FIM - Air cabin crew Transfers: 4-To toilet/BSC: Min A (steadying Pt. > 75%);4-From toilet/BSC: Min A (steadying Pt. > 75%)  FIM - Control and instrumentation engineer Devices:  (none) Bed/Chair Transfer: 5: Bed > Chair or W/C: Supervision (verbal cues/safety issues)  FIM - Locomotion: Wheelchair Locomotion: Wheelchair: 0: Activity did not occur (Pt ambulatory on unit) FIM - Locomotion: Ambulation Ambulation/Gait Assistance: 4: Min assist;4: Min guard Locomotion: Ambulation: 4: Travels 150 ft or more with minimal assistance (Pt.>75%)  Comprehension Comprehension Mode: Auditory Comprehension: 6-Follows complex conversation/direction: With extra time/assistive device  Expression Expression Mode: Verbal Expression: 6-Expresses complex ideas: With extra time/assistive device  Social Interaction Social Interaction: 7-Interacts appropriately with others - No medications needed.  Problem Solving Problem Solving: 5-Solves basic problems: With no assist  Memory Memory: 5-Recognizes or recalls 90% of the time/requires cueing < 10% of the time  Medical Problem List and Plan:  1. Embolic hard right MCA infarct  2. DVT Prophylaxis/Anticoagulation: Eliquis  3. Pain Management: Tylenol as needed  4. Neuropsych: This patient is capable of making decisions on his own behalf.  5. Dysphagia. Dysphagia 3 nectar liquids. Monitor hydration. Followup speech therapy  6. Atrial fibrillation. Cardizem CD 180 mg daily, Lopressor 25 mg twice a day and amiodarone 400 mg twice a day. Will discuss with  cardiology on plan to reduce amiodarone to 200 mg daily on discharge  7. Hyperlipidemia. Lipitor    LOS (Days) 4 A FACE TO FACE EVALUATION WAS PERFORMED  [name] 12/06/2013, 7:30 AM

## 2013-12-06 NOTE — Progress Notes (Signed)
Speech Language Pathology Daily Session Note  Patient Details  Name: SOPHIA CUBERO MRN: [id number] Date of Birth: 15-Mar-1922  Today's Date: 12/06/2013 Time: 9629-5284 Time Calculation (min): 45 min  Short Term Goals: Week 1: SLP Short Term Goal 1 (Week 1): Pt will consume Dys 3 textures and thin liquids using safe swallowing strategies with Mod I SLP Short Term Goal 2 (Week 1): Pt will demonstrate effective mastication and oral clearance with regular textures with Mod I SLP Short Term Goal 3 (Week 1): Pt will demonstrate adequate medication management with use of external aids with Mod I SLP Short Term Goal 4 (Week 1): Pt will demonstrate emergent awareness of impairments during functional tasks with Mod I  Skilled Therapeutic Interventions: Skilled treatment session focused on dysphagia goals. SLP facilitated session by providing Min A verbal cues which faded to supervision cues by end of session in regards to not talking with food in oral cavity, checking oral cavity for residue and utilizing liquid washes to clear reside and attending to left anterior spillage. Pt utilized small bites and sips with Mod I. Pt consumed meal without overt s/s of aspiration.  Pt also had a basket of fruit and peanuts in his room and was educated on appropriate textures. Pt verbalized understanding and reported he had no intentions of consuming the contents of the basket and that he was trying to "give it away." Continue with current plan of care.    FIM:  Comprehension Comprehension Mode: Auditory Comprehension: 6-Follows complex conversation/direction: With extra time/assistive device Expression Expression Mode: Verbal Expression: 6-Expresses complex ideas: With extra time/assistive device Social Interaction Social Interaction: 7-Interacts appropriately with others - No medications needed. Problem Solving Problem Solving: 5-Solves basic problems: With no assist Memory Memory: 4-Recognizes or recalls 75  - 89% of the time/requires cueing 10 - 24% of the time FIM - Eating Eating Activity: 5: Needs verbal cues/supervision  Pain Pain Assessment Pain Assessment: No/denies pain  Therapy/Group: Individual Therapy  Yuri Fana 12/06/2013, 9:51 AM

## 2013-12-06 NOTE — Progress Notes (Signed)
Speech Language Pathology Daily Session Note  Patient Details  Name: [name] MRN: [id number] Date of Birth: [birth year]  Today's Date: 12/06/2013 Time: 1200-1230 Time Calculation (min): 30 min  Short Term Goals: Week 1: SLP Short Term Goal 1 (Week 1): Pt will consume Dys 3 textures and thin liquids using safe swallowing strategies with Mod I SLP Short Term Goal 2 (Week 1): Pt will demonstrate effective mastication and oral clearance with regular textures with Mod I SLP Short Term Goal 3 (Week 1): Pt will demonstrate adequate medication management with use of external aids with Mod I SLP Short Term Goal 4 (Week 1): Pt will demonstrate emergent awareness of impairments during functional tasks with Mod I  Skilled Therapeutic Interventions: Pt participated in co-treatment with OT in diners club with focus on dysphagia goals and self-feeding.  Pt required Min verbal cues for not talking with food in oral cavity, checking oral cavity for residue and utilizing liquid washes to clear residue and attending to left anterior spillage. Pt with cough X 2 and an intermittent wet vocal quality throughout the meal which appeared to clear with verbal cues for liquid washes and multiple swallows. Pt easily distracted by peers at table as well as those walking through treatment area, requiring cues to return focus to self-feeding. Pt with appropriate interaction and PO intake this session. Continue with current plan of care.    FIM:  Comprehension Comprehension Mode: Auditory Comprehension: 6-Follows complex conversation/direction: With extra time/assistive device Expression Expression Mode: Verbal Expression: 6-Expresses complex ideas: With extra time/assistive device Social Interaction Social Interaction: 7-Interacts appropriately with others - No medications needed. Problem Solving Problem Solving: 5-Solves complex 90% of the time/cues < 10% of the time Memory Memory: 4-Recognizes or recalls 75 -  89% of the time/requires cueing 10 - 24% of the time FIM - Eating Eating Activity: 5: Needs verbal cues/supervision  Pain Pain Assessment Pain Assessment: No/denies pain  Therapy/Group: Individual Therapy  April Colter 12/06/2013, 4:06 PM

## 2013-12-07 ENCOUNTER — Inpatient Hospital Stay (HOSPITAL_COMMUNITY): Payer: Medicare Other | Admitting: Physical Therapy

## 2013-12-07 ENCOUNTER — Inpatient Hospital Stay (HOSPITAL_COMMUNITY): Payer: Medicare Other

## 2013-12-07 NOTE — Progress Notes (Signed)
Occupational Therapy Session Note  Patient Details  Name: [name] MRN: [id number] Date of Birth: August 18, 1922  Today's Date: 12/07/2013 Time: 5784-6962 and 1400-1430 Time Calculation (min): 45 min and 30 min   Short Term Goals: Week 1:  OT Short Term Goal 1 (Week 1): Pt will complete toilet transfer at supervision level with min cueing  OT Short Term Goal 2 (Week 1): Pt will complete LB dressing at supervision level with min verbal cues OT Short Term Goal 3 (Week 1): Pt will complete bathing task at supervision level with min cues for safety   Skilled Therapeutic Interventions/Progress Updates:    Session 1: Pt seen for ADL retraining with focus on safety awareness, energy conservation techniques, and dynamic standing balance. Pt ambulated to bathroom at supervision level and completed bathing at supervision with 1 verbal cue to sit and dry most of body for safety and energy conservation. Pt with increased difficulty with dressing and attempting to don while in standing. Instructed to sit and thread LLE first as this was most difficult and increased success noted. Pt with increased difficulty donning button up shirt and therapist instructed on buttoning a few buttons then donning like tshirt. Pt with some difficulty as it was catching onto undershirt and increased difficulty noted with fastening buttons requiring min assist. Pt reported having difficulty with this at home as well. Pt with less SOB noted and required min cues for rest breaks. At end of session pt left sitting in recliner chair with all needs in reach.   Session 2: Pt seen for 1:1 OT session with focus on safety awareness, sequencing, problem solving during simple meal prep task. Pt reported knowing how to make grilled cheese sandwich at beginning of session. Required cues to orient to kitchen and locate items as it was unfamiliar. Pt required mod questioning cues for sequencing and problem solving throughout task. Began  questioning pt on how he makes his at home and reported using microwave instead of skillet, therefore, difficult to determine if cues were required d/t task being unfamiliar or impaired cognition. Pt with min cues for attention to task and attention to hot burner. Pt recalled turning off burner when terminating task and not sticking hot pan in water. Plan to complete simple meal prep task again tomorrow using more familiar task of cooking an egg as he reports doing this every morning. Pt left sitting in recliner chair with friend present at end of session.   Therapy Documentation Precautions:  Precautions Precautions: Fall Precaution Comments: Poor safety awareness Restrictions Weight Bearing Restrictions: No General:   Vital Signs:   Pain: No report of pain during therapy sessions.  See FIM for current functional status  Therapy/Group: Individual Therapy  Duayne Cal 12/07/2013, 11:58 AM

## 2013-12-07 NOTE — Progress Notes (Signed)
80 y.o. right-handed male with history of hypertension, atrial fibrillation as well as CVA 2009 with little residual. Patient independent living alone and driving prior to admission. Admitted 11/29/2013 with generalized weakness x2 days after being found in the floor by a friend. MRI of the brain showed large posterior and inferior right MCA division infarct without hemorrhage. MRA of the brain with focal high grade stenosis of the posterior inferior right M2 division. Carotid Dopplers with no ICA stenosis. Echocardiogram with ejection fraction of 55% with normal systolic function. Patient did not receive TPA. Neurology services consulted maintained on aspirin for CVA prophylaxis. Followup cardiology services(Dr. Einar Gip) for atrial fibrillation with rapid ventricular response maintained on amiodarone/ Cardizem and metoprolol And started on eliquis 12/01/2013 for atrial fibrillation. Currently on dysphagia 3 nectar thick liquid diet   Subjective/Complaints: Wants to go home soon Review of Systems - Negative except left shoulder weakness, decreased ROM both shoulders Objective: Vital Signs: Blood pressure 119/74, pulse 53, temperature 97.9 F (36.6 C), temperature source Oral, resp. rate 18, SpO2 97.00%. No results found. No results found for this or any previous visit (from the past 72 hour(s)).   HEENT: normal Cardio: irregular and no murmur Resp: CTA B/L and unlabored GI: BS positive and non distended Extremity:  Pulses positive and No Edema Skin:   Intact Neuro: Alert/Oriented, Cranial Nerve II-XII normal, Abnormal Motor 4/5 LUE otherwise 5/5 and Abnormal FMC Ataxic/ dec FMC Musc/Skel:  Other Left Shoulder sublux, Bilatera AROM shoulder abd 80 degrees Gen NAD   Assessment/Plan: 1. Functional deficits secondary to embolic R MCA infarct which require 3+ hours per day of interdisciplinary therapy in a comprehensive inpatient rehab setting. Physiatrist is providing close team supervision and  24 hour management of active medical problems listed below. Physiatrist and rehab team continue to assess barriers to discharge/monitor patient progress toward functional and medical goals. FIM: FIM - Bathing Bathing Steps Patient Completed: Chest;Front perineal area;Right lower leg (including foot);Left lower leg (including foot);Buttocks;Right Arm;Left Arm;Right upper leg;Left upper leg;Abdomen Bathing: 5: Supervision: Safety issues/verbal cues  FIM - Upper Body Dressing/Undressing Upper body dressing/undressing steps patient completed: Thread/unthread right sleeve of pullover shirt/dresss;Thread/unthread left sleeve of pullover shirt/dress;Pull shirt over trunk;Put head through opening of pull over shirt/dress;Thread/unthread left sleeve of front closure shirt/dress;Pull shirt around back of front closure shirt/dress;Button/unbutton shirt Upper body dressing/undressing: 4: Min-Patient completed 75 plus % of tasks FIM - Lower Body Dressing/Undressing Lower body dressing/undressing steps patient completed: Thread/unthread right underwear leg;Thread/unthread left underwear leg;Thread/unthread right pants leg;Thread/unthread left pants leg;Pull pants up/down;Don/Doff right shoe;Don/Doff left shoe;Fasten/unfasten left shoe;Fasten/unfasten right shoe;Pull underwear up/down;Don/Doff right sock;Don/Doff left sock;Fasten/unfasten pants Lower body dressing/undressing: 5: Set-up assist to: Don/Doff TED stocking  FIM - Toileting Toileting steps completed by patient: Adjust clothing prior to toileting;Performs perineal hygiene;Adjust clothing after toileting Toileting Assistive Devices: Grab bar or rail for support Toileting: 5: Supervision: Safety issues/verbal cues  FIM - Radio producer Devices: Grab bars Toilet Transfers: 5-To toilet/BSC: Supervision (verbal cues/safety issues);5-From toilet/BSC: Supervision (verbal cues/safety issues)  FIM - Ship broker Devices:  (none) Bed/Chair Transfer: 5: Bed > Chair or W/C: Supervision (verbal cues/safety issues)  FIM - Locomotion: Wheelchair Locomotion: Wheelchair: 0: Activity did not occur FIM - Locomotion: Ambulation Locomotion: Ambulation Assistive Devices:  (none) Ambulation/Gait Assistance: 4: Min assist Locomotion: Ambulation: 4: Travels 150 ft or more with minimal assistance (Pt.>75%)  Comprehension Comprehension Mode: Auditory Comprehension: 6-Follows complex conversation/direction: With extra time/assistive device  Expression Expression Mode: Verbal Expression:  6-Expresses complex ideas: With extra time/assistive device  Social Interaction Social Interaction: 7-Interacts appropriately with others - No medications needed.  Problem Solving Problem Solving: 5-Solves basic problems: With no assist  Memory Memory: 5-Recognizes or recalls 90% of the time/requires cueing < 10% of the time  Medical Problem List and Plan:  1. Embolic hard right MCA infarct currently Sup/Min A level 2. DVT Prophylaxis/Anticoagulation: Eliquis  3. Pain Management: Tylenol as needed  4. Neuropsych: This patient is capable of making decisions on his own behalf.  5. Dysphagia. Dysphagia 3 nectar liquids. Monitor hydration. Followup speech therapy  6. Atrial fibrillation. Cardizem CD 180 mg daily, Lopressor 25 mg twice a day and amiodarone 400 mg twice a day. Will discuss with cardiology on plan to reduce amiodarone to 200 mg daily on discharge  7. Hyperlipidemia. Lipitor    LOS (Days) 5 A FACE TO FACE EVALUATION WAS PERFORMED  Beretta Ginsberg E 12/07/2013, 7:22 AM

## 2013-12-07 NOTE — Progress Notes (Signed)
Physical Therapy Session Note  Patient Details  Name: [name] MRN: [id number] Date of Birth: [birth year]  Today's Date: 12/07/2013 Time: 0915-1005 and 1122-1150 Time Calculation (min): 49 and  28 min  Short Term Goals: Week 1:  PT Short Term Goal 1 (Week 1): Pt to perform supine<>sit with mod I. PT Short Term Goal 2 (Week 1): Pt to perform bed<>chair transfer with supervision. PT Short Term Goal 3 (Week 1): Pt to perform gait x150' in controlled environment with supervision. PT Short Term Goal 4 (Week 1): Pt to negotiate 4 stairs without rails with supervision to progress toward pt safety entering home.  Skilled Therapeutic Interventions/Progress Updates:    Treatment Session 1: Pt received seated in bedside chair; agreeable to therapy. Session focused on safety awareness with functional mobility and dynamic balance. Cueing required for safety awareness during dressing secondary to pt attempting to  while donning pants in standing. Performed gait x180' in controlled environment with supervision-min guard secondary to impaired L-sided obstacle negotiation x2 episodes. Pt performed Dynamic Gait Index, scoring 16/22. See below for detailed findings. Performed car transfer x2 trials with supervision, min verbal cueing for hand placement during initial trial with effective within-session carryover. Returned to pt room, where pt performed toilet transfer with grab bars, supervision. No episodes of emotional lability noted during session. Therapist departed with pt seated in bedside chair with all needs within reach.  Treatment Session 2: Pt received seated in bedside chair; agreeable to therapy. Session focused on assessing/addressing pt stability/independence with mobility in community environment. Negotiated 9 stairs without rails, forward-facing, step-to pattern with supervision/cueing for sequencing; pt with inconsistent carryover of sequencing. Gait >300' in controlled and community  environments with supervision in controlled environment, close supervision in community environment. During gait trials, pt exhibits increased stability with head turning, normalized BOS. Pt independently performed multiple sit<>stands performed throughout session. Pt with x3 episodes of emotional lability during session. Session ended in pt room, where pt was left seated in bedside chair with all needs within reach.   Therapy Documentation Precautions:  Precautions Precautions: Fall Precaution Comments: Poor safety awareness Restrictions Weight Bearing Restrictions: No Pain: Pain Assessment Pain Assessment: No/denies pain Locomotion : Ambulation Ambulation/Gait Assistance: 5: Supervision (close supervision)  Balance: Balance Balance Assessed: Yes Standardized Balance Assessment Standardized Balance Assessment: Dynamic Gait Index Dynamic Gait Index Level Surface: Normal Change in Gait Speed: Mild Impairment Gait with Horizontal Head Turns: Mild Impairment Gait with Vertical Head Turns: Mild Impairment Gait and Pivot Turn: Moderate Impairment Step Over Obstacle: Mild Impairment Step Around Obstacles: Normal Steps: Moderate Impairment Total Score: 16  See FIM for current functional status  Therapy/Group: Individual Therapy  [name], Malva Cogan 12/07/2013, 11:59 AM

## 2013-12-07 NOTE — Progress Notes (Signed)
Speech Language Pathology Daily Session Note  Patient Details  Name: [name] MRN: [id number] Date of Birth: [birth year]  Today's Date: 12/07/2013 Time: 6720-9470 Time Calculation (min): 45 min  Short Term Goals: Week 1: SLP Short Term Goal 1 (Week 1): Pt will consume Dys 3 textures and thin liquids using safe swallowing strategies with Mod I SLP Short Term Goal 2 (Week 1): Pt will demonstrate effective mastication and oral clearance with regular textures with Mod I SLP Short Term Goal 3 (Week 1): Pt will demonstrate adequate medication management with use of external aids with Mod I SLP Short Term Goal 4 (Week 1): Pt will demonstrate emergent awareness of impairments during functional tasks with Mod I  Skilled Therapeutic Interventions: Skilled treatment focused on cognitive goals. Upon SLP arrival, pt had completed breakfast meal, consuming ~90% of Dys 3 textures and thin liquids, without supervision per pt report. There was no evidence of oral residue and vocal quality was at baseline. Pt participated in functional conversation related to medication management, throughout which pt was tangential, preferring to talk about previous medications and requiring cues to discuss current regimen. Pt demonstrated verbal problem solving related to application of previous medication management strategies to updated regimen with supervision level verbal cues. Pt demonstrated adequate understanding of previous medications prior to admission, including their functions and when they were taken. External memory aid was provided for updated list. Continue plan of care.   FIM:  Comprehension Comprehension Mode: Auditory Comprehension: 6-Follows complex conversation/direction: With extra time/assistive device Expression Expression Mode: Verbal Expression: 6-Expresses complex ideas: With extra time/assistive device Social Interaction Social Interaction: 5-Interacts appropriately 90% of the time - Needs  monitoring or encouragement for participation or interaction. Problem Solving Problem Solving: 5-Solves complex 90% of the time/cues < 10% of the time Memory Memory: 5-Recognizes or recalls 90% of the time/requires cueing < 10% of the time FIM - Eating Eating Activity: 5: Supervision/cues;5: Set-up assist for open containers  Pain Pain Assessment Pain Assessment: No/denies pain  Therapy/Group: Individual Therapy   Germain Osgood, M.A. CCC-SLP [phone]   Germain Osgood 12/07/2013, 10:55 AM

## 2013-12-08 ENCOUNTER — Inpatient Hospital Stay (HOSPITAL_COMMUNITY): Payer: Medicare Other

## 2013-12-08 ENCOUNTER — Inpatient Hospital Stay (HOSPITAL_COMMUNITY): Payer: Medicare Other | Admitting: Physical Therapy

## 2013-12-08 MED ORDER — AMIODARONE HCL 100 MG PO TABS
100.0000 mg | ORAL_TABLET | Freq: Every day | ORAL | Status: DC
  Administered 2013-12-08 – 2013-12-10 (×3): 100 mg via ORAL
  Filled 2013-12-08 (×4): qty 1

## 2013-12-08 NOTE — Progress Notes (Signed)
Social Work Patient ID: [name], male   DOB: May 19, 1922, 80 y.o.   MRN: [id number] Met with pt to inform of team conference goals and discharge date-2/13.  He feels he is doing well and has no worries.  He states; " What do I need to worry about dying, I'm 80 years old." He is ready to go home and feels he will manage like he has before.  His friend-Junior has offered to stay with him a few days which would be good for the transition.   He doesn't feel he needs any follow up, he questions the reason for it.  Will ask again tomorrow.

## 2013-12-08 NOTE — Progress Notes (Signed)
Physical Therapy Note  Patient Details  Name: [name] MRN: [id number] Date of Birth: 1922/09/19 Today's Date: 12/08/2013  1300-1330 No pain reported  tx focused on dynamic standing balance in ADL apt for hanging clothes on hangars, retrieving and replacing 15 items from bottom shelf in kitchen, standing tandem on Balance Zone Training System, with min assist progressing to close supervision; no LOB.  Gait without AD x 100' x 2 with close supervision, no LOB.  Instructed pt in self stretching bil hamstrings and heel cords in sitting, using footstool.  [name] 12/08/2013, 1:24 PM

## 2013-12-08 NOTE — Progress Notes (Signed)
Speech Language Pathology Daily Session Note  Patient Details  Name: [name] MRN: [id number] Date of Birth: [birth year]  Today's Date: 12/08/2013 Time: 9628-3662 Time Calculation (min): 45 min  Short Term Goals: Week 1: SLP Short Term Goal 1 (Week 1): Pt will consume Dys 3 textures and thin liquids using safe swallowing strategies with Mod I SLP Short Term Goal 2 (Week 1): Pt will demonstrate effective mastication and oral clearance with regular textures with Mod I SLP Short Term Goal 3 (Week 1): Pt will demonstrate adequate medication management with use of external aids with Mod I SLP Short Term Goal 4 (Week 1): Pt will demonstrate emergent awareness of impairments during functional tasks with Mod I  Skilled Therapeutic Interventions: Skilled treatment focused on cognitive and dysphagia goals. SLP facilitated session with review of medication list created from previous session. Pt utilized external memory aid with Mod I to discuss medications, their functions, and when to take them. Pt declined use of pill box, explaining his home routine for medication management clearly. Pt demonstrated verbal problem solving related to medication management with Mod I. Pt consumed thin liquids with no overt s/s of aspiration utilizing safe swallowing strategies with Mod I. Continue plan of care.   FIM:  Comprehension Comprehension Mode: Auditory Comprehension: 6-Follows complex conversation/direction: With extra time/assistive device Expression Expression Mode: Verbal Expression: 6-Expresses complex ideas: With extra time/assistive device Social Interaction Social Interaction: 7-Interacts appropriately with others - No medications needed. Problem Solving Problem Solving: 6-Solves complex problems: With extra time Memory Memory: 6-More than reasonable amt of time FIM - Eating Eating Activity: 6: Swallowing techniques: self-managed (observed with thin liquids only)  Pain Pain  Assessment Pain Assessment: No/denies pain  Therapy/Group: Individual Therapy   Germain Osgood, M.A. CCC-SLP [phone]   Germain Osgood 12/08/2013, 12:13 PM

## 2013-12-08 NOTE — Progress Notes (Signed)
Physical Therapy Session Note  Patient Details  Name: [name] MRN: [id number] Date of Birth: [birth year]  Today's Date: 12/08/2013 Time: 1062-6948 Time Calculation (min): 40 min  Short Term Goals: Week 1:  PT Short Term Goal 1 (Week 1): Pt to perform supine<>sit with mod I. PT Short Term Goal 2 (Week 1): Pt to perform bed<>chair transfer with supervision. PT Short Term Goal 3 (Week 1): Pt to perform gait x150' in controlled environment with supervision. PT Short Term Goal 4 (Week 1): Pt to negotiate 4 stairs without rails with supervision to progress toward pt safety entering home.  Skilled Therapeutic Interventions/Progress Updates:   Pt agreeable to therapy but verbalizing frustration about delayed D/C.  Pt reports he is tired of practicing everything for home and is ready to just be home.  Pt agreeable to gait training outside.  Performed gait through hospital >1,000' without AD, mod I over carpet, tile, multiple changes in direction, head turns and in distracting/crowded environment.  Performed gait training outside >500' at a time over uneven, sloping pavement, over uneven grassy terrain, up/down 5 stairs without hand rail with step to sequence all mod I while having a conversation with therapist.  Pt did demonstrate DOE and required 2 seated rest breaks for breathing.  Returned to room mod I.    Therapy Documentation Precautions:  Precautions Precautions: Fall Precaution Comments: Poor safety awareness Restrictions Weight Bearing Restrictions: No Vital Signs: Therapy Vitals Temp: 98.3 F (36.8 C) Temp src: Oral Pulse Rate: 59 Resp: 22 BP: 124/67 mmHg Patient Position, if appropriate: Sitting Oxygen Therapy SpO2: 97 % O2 Device: None (Room air) Pain: Pain Assessment Pain Assessment: No/denies pain  See FIM for current functional status  Therapy/Group: Individual Therapy  Raylene Everts [facility] 12/08/2013, 4:38 PM

## 2013-12-08 NOTE — Progress Notes (Signed)
80 y.o. right-handed male with history of hypertension, atrial fibrillation as well as CVA 2009 with little residual. Patient independent living alone and driving prior to admission. Admitted 11/29/2013 with generalized weakness x2 days after being found in the floor by a friend. MRI of the brain showed large posterior and inferior right MCA division infarct without hemorrhage. MRA of the brain with focal high grade stenosis of the posterior inferior right M2 division. Carotid Dopplers with no ICA stenosis. Echocardiogram with ejection fraction of 55% with normal systolic function. Patient did not receive TPA. Neurology services consulted maintained on aspirin for CVA prophylaxis. Followup cardiology services(Dr. Einar Gip) for atrial fibrillation with rapid ventricular response maintained on amiodarone/ Cardizem and metoprolol And started on eliquis 12/01/2013 for atrial fibrillation. Currently on dysphagia 3 nectar thick liquid diet   Subjective/Complaints: No dizziness, freq urination at noc but no burning, pt drinking a lot of fluids Review of Systems - Negative except left shoulder weakness, decreased ROM both shoulders Objective: Vital Signs: Blood pressure 128/73, pulse 51, temperature 97.7 F (36.5 C), temperature source Oral, resp. rate 18, SpO2 96.00%. No results found. No results found for this or any previous visit (from the past 72 hour(s)).   HEENT: normal Cardio: irregular and no murmur Resp: CTA B/L and unlabored GI: BS positive and non distended Extremity:  Pulses positive and No Edema Skin:   Intact Neuro: Alert/Oriented, Cranial Nerve II-XII normal, Abnormal Motor 4/5 LUE otherwise 5/5 and Abnormal FMC Ataxic/ dec FMC Musc/Skel:  Other Left Shoulder sublux, Bilatera AROM shoulder abd 80 degrees Gen NAD   Assessment/Plan: 1. Functional deficits secondary to embolic R MCA infarct which require 3+ hours per day of interdisciplinary therapy in a comprehensive inpatient rehab  setting. Physiatrist is providing close team supervision and 24 hour management of active medical problems listed below. Physiatrist and rehab team continue to assess barriers to discharge/monitor patient progress toward functional and medical goals. Team conference today please see physician documentation under team conference tab, met with team face-to-face to discuss problems,progress, and goals. Formulized individual treatment plan based on medical history, underlying problem and comorbidities. FIM: FIM - Bathing Bathing Steps Patient Completed: Chest;Front perineal area;Right lower leg (including foot);Left lower leg (including foot);Buttocks;Right Arm;Left Arm;Right upper leg;Left upper leg;Abdomen Bathing: 5: Supervision: Safety issues/verbal cues (verbal cues)  FIM - Upper Body Dressing/Undressing Upper body dressing/undressing steps patient completed: Thread/unthread right sleeve of pullover shirt/dresss;Thread/unthread left sleeve of pullover shirt/dress;Pull shirt over trunk;Put head through opening of pull over shirt/dress;Thread/unthread left sleeve of front closure shirt/dress;Pull shirt around back of front closure shirt/dress;Thread/unthread right sleeve of front closure shirt/dress Upper body dressing/undressing: 4: Min-Patient completed 75 plus % of tasks FIM - Lower Body Dressing/Undressing Lower body dressing/undressing steps patient completed: Thread/unthread right underwear leg;Thread/unthread left underwear leg;Thread/unthread right pants leg;Thread/unthread left pants leg;Pull pants up/down;Pull underwear up/down;Don/Doff right sock;Don/Doff left sock;Fasten/unfasten pants (required assist d/t decreased time) Lower body dressing/undressing: 4: Min-Patient completed 75 plus % of tasks  FIM - Toileting Toileting steps completed by patient: Adjust clothing prior to toileting;Performs perineal hygiene;Adjust clothing after toileting Toileting Assistive Devices: Grab bar or rail  for support Toileting: 5: Supervision: Safety issues/verbal cues  FIM - Radio producer Devices: Grab bars Toilet Transfers: 5-To toilet/BSC: Supervision (verbal cues/safety issues);5-From toilet/BSC: Supervision (verbal cues/safety issues)  FIM - Control and instrumentation engineer Devices:  (none) Bed/Chair Transfer: 5: Bed > Chair or W/C: Supervision (verbal cues/safety issues)  FIM - Locomotion: Wheelchair Locomotion: Wheelchair: 0: Activity did not  occur FIM - Locomotion: Ambulation Locomotion: Ambulation Assistive Devices: Other (comment) (none) Ambulation/Gait Assistance: 5: Supervision (close supervision) Locomotion: Ambulation: 5: Travels 150 ft or more with supervision/safety issues  Comprehension Comprehension Mode: Auditory Comprehension: 6-Follows complex conversation/direction: With extra time/assistive device  Expression Expression Mode: Verbal Expression: 6-Expresses complex ideas: With extra time/assistive device  Social Interaction Social Interaction: 7-Interacts appropriately with others - No medications needed.  Problem Solving Problem Solving: 5-Solves basic problems: With no assist  Memory Memory: 5-Recognizes or recalls 90% of the time/requires cueing < 10% of the time  Medical Problem List and Plan:  1. Embolic hard right MCA infarct currently Sup/Min A level 2. DVT Prophylaxis/Anticoagulation: Eliquis  3. Pain Management: Tylenol as needed  4. Neuropsych: This patient is capable of making decisions on his own behalf.  5. Dysphagia. Dysphagia 3 nectar liquids. Monitor hydration. Followup speech therapy  6. Atrial fibrillation. D/C Cardizem CD 180 mg daily,Cont  Lopressor 25 mg twice a day and reduce  amiodarone 100 mg  a day.  discussed with cardiology  7. Hyperlipidemia. Lipitor    LOS (Days) 6 A FACE TO FACE EVALUATION WAS PERFORMED  [name] E 12/08/2013, 7:53 AM

## 2013-12-08 NOTE — Progress Notes (Signed)
Occupational Therapy Session Note  Patient Details  Name: [name] MRN: [id number] Date of Birth: 03-Jun-1922  Today's Date: 12/08/2013 Time: 2297-9892 and 1194-1740 Time Calculation (min): 45 min and 25 min   Short Term Goals: Week 1:  OT Short Term Goal 1 (Week 1): Pt will complete toilet transfer at supervision level with min cueing  OT Short Term Goal 2 (Week 1): Pt will complete LB dressing at supervision level with min verbal cues OT Short Term Goal 3 (Week 1): Pt will complete bathing task at supervision level with min cues for safety   Skilled Therapeutic Interventions/Progress Updates:    Session 1: Pt seen for ADL retraining with focus on dynamic standing balance during head turns (R>L), functional transfers, and safety awareness. Pt received sitting in recliner chair requiring increased time to don shoes and socks. Pt required min cues for redirection to task. Pt ambulated to bathroom at supervision level and improved safety with descent to toilet. In hallway, engaged in dynamic balance activity of catching and tossing ball to right side while ambulating. Pt initially required min guard assist and mod cues for slowing pace for safety. Pt improved to supervision and had 1 LOB with head turn R requiring min assist to correct. Pt required rest break then ambulated to ADL apartment with carryover of safety cues for slowing pace. Practiced simulated walk-in shower with 3-4 inch ledge 3x at supervision-Mod I level. Discussed home management tasks that pt plans to complete. Pt completed vacuuming task at Mod I level. Discussed floor transfer and therapist provided visual demonstration and verbal instructions. Pt returned demonstration at supervision level. At end of session pt returned to room and left with all needs in reach.   Session 2: Pt seen for 1:1 OT session with focus on safety awareness and overall awareness during IADL task of meal prep. Pt completed familiar meal prep task of  frying egg as he reports doing this each day at home. Pt required min cueing to orient to therapy kitchen. Pt with improved carryover of safety using burner closest to him and was slightly less distracted this date. Pt with good safety when transporting hot pan to sink for clean up following task. Pt required cue to turn burner off. Discussed home setup of kitchen at home. At end of session pt returned to room and left with all items in reach.   Therapy Documentation Precautions:  Precautions Precautions: Fall Precaution Comments: Poor safety awareness Restrictions Weight Bearing Restrictions: No General:   Vital Signs: Therapy Vitals Pulse Rate: 71 BP: 108/68 mmHg Pain: Pt with no report of pain during therapy sessions.   See FIM for current functional status  Therapy/Group: Individual Therapy  Duayne Cal 12/08/2013, 12:18 PM

## 2013-12-08 NOTE — Progress Notes (Signed)
Social Work Elease Hashimoto, LCSW Social Worker Signed  Patient Care Conference Service date: 12/08/2013 1:21 PM  Inpatient RehabilitationTeam Conference and Plan of Care Update Date: 12/08/2013   Time: 11:20 AM     Patient Name: [name]       Medical Record Number: [id number]   Date of Birth: Apr 25, 1922 Sex: Male         Room/Bed: 4M01C/4M01C-01 Payor Info: Payor: MEDICARE / Plan: MEDICARE PART A AND B / Product Type: *No Product type* /   Admitting Diagnosis: R MCA   Admit Date/Time:  12/02/2013  4:46 PM Admission Comments: No comment available   Primary Diagnosis:  <principal problem not specified> Principal Problem: <principal problem not specified>    Patient Active Problem List     Diagnosis  Date Noted   .  Atrial fibrillation with rapid ventricular response  11/29/2013   .  CVA (cerebral infarction)  11/29/2013   .  OTHER VITAMIN B12 DEFICIENCY ANEMIA  01/08/2008   .  ANAL FISTULA  01/08/2008     Expected Discharge Date: Expected Discharge Date: 12/10/13  Team Members Present: Physician leading conference: Dr. Alysia Penna Social Worker Present: Alfonse Alpers, LCSW;Becky Natalyah Cummiskey, LCSW Nurse Present: Other (comment) Soyla Murphy) PT Present: Georjean Mode, PT;Other (comment) Jilda Roche) OT Present: Antony Salmon, Roland Earl, OT SLP Present: Germain Osgood, SLP PPS Coordinator present : Daiva Nakayama, RN, CRRN        Current Status/Progress  Goal  Weekly Team Focus   Medical     bradycardia  HR>60  med adjustments as per cardiology   Bowel/Bladder     cont of bowel and bladder, uses toilet  remain cont of bowel and bladder mod I  monitor   Swallow/Nutrition/ Hydration     Dys 3 textures and thin liquids with intermittent supervision  Mod I with least restrictive PO  diet tolerance, increase use of strategies   ADL's     supervision overall (occasional min assist for buttons of shirt) with verbal cues for safety awareness and energy  conservation techniques  Mod I   pt education, dynamic standing balance, functional transfers, safety awareness, strengtheing, IADL tasks    Mobility     Supervision with transfers/gait in controlled environment, supervision-min A with gait/transfers in community environment  Mod I overall  Postural/gait stability in community, gait with head turns, stair negotiation, endurance, floor transfers   Communication     [facility]       Safety/Cognition/ Behavioral Observations    supervision cues for complex problem solving related to medication management task  Mod I  medication management, complex problem solving, emergent/anticipatory awareness, recall of new information   Pain     n/a       Skin     CDI, fragile, scabbed skin tear L arm OTA, scattered brusing, pre-existing L elbow "cyst"   no skin breakdown or infection while in Rehab  assess skin qshift and prn     *See Care Plan and progress notes for long and short-term goals.    Barriers to Discharge:  see above      Possible Resolutions to Barriers:    see above      Discharge Planning/Teaching Needs:    Home alone with intermittent checks from lady friend and friend-Junior      Team Discussion:    Working balance issues-doing well and will reach mod/i level.  Safety awareness is the issues-but had pre-morbidly.  MD adjusting cardiac meds.  Pt  feels ready to go home.   Revisions to Treatment Plan:    None    Continued Need for Acute Rehabilitation Level of Care: The patient requires daily medical management by a physician with specialized training in physical medicine and rehabilitation for the following conditions: Daily direction of a multidisciplinary physical rehabilitation program to ensure safe treatment while eliciting the highest outcome that is of practical value to the patient.: Yes Daily medical management of patient stability for increased activity during participation in an intensive rehabilitation regime.: Yes Daily  analysis of laboratory values and/or radiology reports with any subsequent need for medication adjustment of medical intervention for : Neurological problems;Cardiac problems  Elease Hashimoto 12/08/2013, 1:21 PM          Patient ID: [name], male   DOB: 04-20-1922, 80 y.o.   MRN: [id number]

## 2013-12-08 NOTE — Progress Notes (Signed)
80 y.o. right-handed male with history of hypertension, atrial fibrillation as well as CVA 2009 with little residual. Patient independent living alone and driving prior to admission. Admitted 11/29/2013 with generalized weakness x2 days after being found in the floor by a friend. MRI of the brain showed large posterior and inferior right MCA division infarct without hemorrhage. MRA of the brain with focal high grade stenosis of the posterior inferior right M2 division. Carotid Dopplers with no ICA stenosis. Echocardiogram with ejection fraction of 55% with normal systolic function. Patient did not receive TPA. Neurology services consulted maintained on aspirin for CVA prophylaxis. Followup cardiology services(Dr. Ganji) for atrial fibrillation with rapid ventricular response maintained on amiodarone/ Cardizem and metoprolol And started on eliquis 12/01/2013 for atrial fibrillation. Currently on dysphagia 3 nectar thick liquid diet   Subjective/Complaints: Wants to go home soon Review of Systems - Negative except left shoulder weakness, decreased ROM both shoulders Objective: Vital Signs: Blood pressure 128/73, pulse 51, temperature 97.7 F (36.5 C), temperature source Oral, resp. rate 18, SpO2 96.00%. No results found. No results found for this or any previous visit (from the past 72 hour(s)).   HEENT: normal Cardio: irregular and no murmur Resp: CTA B/L and unlabored GI: BS positive and non distended Extremity:  Pulses positive and No Edema Skin:   Intact Neuro: Alert/Oriented, Cranial Nerve II-XII normal, Abnormal Motor 4/5 LUE otherwise 5/5 and Abnormal FMC Ataxic/ dec FMC Musc/Skel:  Other Left Shoulder sublux, Bilatera AROM shoulder abd 80 degrees Gen NAD   Assessment/Plan: 1. Functional deficits secondary to embolic R MCA infarct which require 3+ hours per day of interdisciplinary therapy in a comprehensive inpatient rehab setting. Physiatrist is providing close team supervision and  24 hour management of active medical problems listed below. Physiatrist and rehab team continue to assess barriers to discharge/monitor patient progress toward functional and medical goals. Team conference today please see physician documentation under team conference tab, met with team face-to-face to discuss problems,progress, and goals. Formulized individual treatment plan based on medical history, underlying problem and comorbidities. FIM: FIM - Bathing Bathing Steps Patient Completed: Chest;Front perineal area;Right lower leg (including foot);Left lower leg (including foot);Buttocks;Right Arm;Left Arm;Right upper leg;Left upper leg;Abdomen Bathing: 5: Supervision: Safety issues/verbal cues (verbal cues)  FIM - Upper Body Dressing/Undressing Upper body dressing/undressing steps patient completed: Thread/unthread right sleeve of pullover shirt/dresss;Thread/unthread left sleeve of pullover shirt/dress;Pull shirt over trunk;Put head through opening of pull over shirt/dress;Thread/unthread left sleeve of front closure shirt/dress;Pull shirt around back of front closure shirt/dress;Thread/unthread right sleeve of front closure shirt/dress Upper body dressing/undressing: 4: Min-Patient completed 75 plus % of tasks FIM - Lower Body Dressing/Undressing Lower body dressing/undressing steps patient completed: Thread/unthread right underwear leg;Thread/unthread left underwear leg;Thread/unthread right pants leg;Thread/unthread left pants leg;Pull pants up/down;Pull underwear up/down;Don/Doff right sock;Don/Doff left sock;Fasten/unfasten pants (required assist d/t decreased time) Lower body dressing/undressing: 4: Min-Patient completed 75 plus % of tasks  FIM - Toileting Toileting steps completed by patient: Adjust clothing prior to toileting;Performs perineal hygiene;Adjust clothing after toileting Toileting Assistive Devices: Grab bar or rail for support Toileting: 5: Supervision: Safety issues/verbal  cues  FIM - Toilet Transfers Toilet Transfers Assistive Devices: Grab bars Toilet Transfers: 5-To toilet/BSC: Supervision (verbal cues/safety issues);5-From toilet/BSC: Supervision (verbal cues/safety issues)  FIM - Bed/Chair Transfer Bed/Chair Transfer Assistive Devices:  (none) Bed/Chair Transfer: 5: Bed > Chair or W/C: Supervision (verbal cues/safety issues)  FIM - Locomotion: Wheelchair Locomotion: Wheelchair: 0: Activity did not occur FIM - Locomotion: Ambulation Locomotion: Ambulation Assistive Devices: Other (  comment) (none) Ambulation/Gait Assistance: 5: Supervision (close supervision) Locomotion: Ambulation: 5: Travels 150 ft or more with supervision/safety issues  Comprehension Comprehension Mode: Auditory Comprehension: 6-Follows complex conversation/direction: With extra time/assistive device  Expression Expression Mode: Verbal Expression: 6-Expresses complex ideas: With extra time/assistive device  Social Interaction Social Interaction: 7-Interacts appropriately with others - No medications needed.  Problem Solving Problem Solving: 5-Solves basic problems: With no assist  Memory Memory: 5-Recognizes or recalls 90% of the time/requires cueing < 10% of the time  Medical Problem List and Plan:  1. Embolic hard right MCA infarct currently Sup/Min A level 2. DVT Prophylaxis/Anticoagulation: Eliquis  3. Pain Management: Tylenol as needed  4. Neuropsych: This patient is capable of making decisions on his own behalf.  5. Dysphagia. Dysphagia 3 nectar liquids. Monitor hydration. Followup speech therapy  6. Atrial fibrillation. Cardizem CD 180 mg daily, Lopressor 25 mg twice a day and amiodarone 400 mg twice a day. Will discuss with cardiology on plan to reduce amiodarone to 200 mg daily on discharge  7. Hyperlipidemia. Lipitor    LOS (Days) 6 A FACE TO FACE EVALUATION WAS PERFORMED  KIRSTEINS,ANDREW E 12/08/2013, 7:44 AM

## 2013-12-08 NOTE — Progress Notes (Signed)
Subjective:  Patient is presently doing well and has progressed well in the rehabilitation program. Plans on being discharged home soon. Has noted to have bradycardia. No other specific complaints.  Having his breakfast without any difficulty in swallowing including swallowing liquids. Objective:  Vital Signs in the last 24 hours: Temp:  [97.7 F (36.5 C)-97.8 F (36.6 C)] 97.7 F (36.5 C) (02/11 0535) Pulse Rate:  [51-62] 51 (02/11 0535) Resp:  [18] 18 (02/11 0535) BP: (128-142)/(65-73) 128/73 mmHg (02/11 0535) SpO2:  [93 %-96 %] 96 % (02/11 0535)  Intake/Output from previous day: 02/10 0701 - 02/11 0700 In: 600 [P.O.:600] Out: -   Physical Exam: General appearance: alert, cooperative, appears stated age, no distress.  Eyes: negative findings: lids and lashes normal  Neck: no adenopathy, no carotid bruit, no JVD, supple, symmetrical, trachea midline and thyroid not enlarged, symmetric, no tenderness/mass/nodules  Neck: JVP - normal, carotids 2+= without bruits  Resp: clear to auscultation bilaterally  Chest wall: no tenderness  Cardio: S1 normal S2. II/VI SEM in apex and II/VI EDM in the right sternal border.  GI: soft, non-tender; bowel sounds normal; no masses, no organomegaly  Extremities: extremities normal, atraumatic, no cyanosis or edema Pulses: 2+ and symmetric  Neurologic: Grossly normal.  Lipid Panel     Component Value Date/Time   CHOL 140 11/30/2013 0245   TRIG 98 11/30/2013 0245   HDL 49 11/30/2013 0245   CHOLHDL 2.9 11/30/2013 0245   VLDL 20 11/30/2013 0245   LDLCALC 71 11/30/2013 0245   Scheduled Meds: . amiodarone  100 mg Oral Daily  . apixaban  5 mg Oral BID  . atorvastatin  20 mg Oral q1800  . metoprolol tartrate  25 mg Oral BID   Continuous Infusions:   PRN Meds:.acetaminophen, ondansetron (ZOFRAN) IV, ondansetron, RESOURCE THICKENUP CLEAR, senna-docusate, sorbitol  Echo 11/30/2013: Left ventricle: The cavity size was normal. There was mild concentric  hypertrophy. Systolic function was normal. The estimated ejection fraction was in the range of 50% to 55%. Mild AS and AR.   Assessment/Plan:  1.  Paroxysmal atrial fibrillation  2. Bradycardia 3. Cardioembolic stroke without gross motor deficits, swallowing has improved significantly, MRI 11/30/2013 1. Large posterior and inferior right MCA division infarct without  evidence for hemorrhage. Much of the primary motor cortex is spared.  2. Atrophy and moderate white matter disease is present bilaterally,  likely chronic.  3. Focal high-grade stenosis of the posterior inferior right M2  division.  4.  Hypertension  Recommendation:  I will discontinue Cardizem, continue the Toprol and decrease the dose of amiodarone to 100 mg by mouth daily. Patient is tolerating the medications well and although has bradycardia, essentially remains asymptomatic with preserved vital signs. I would like to see him back in the office 2 weeks after the discharge.  Laverda Page, M.D. 12/08/2013, 8:51 AM [facility] Pager: [phone] Office: [phone] If no answer: [phone]

## 2013-12-08 NOTE — Patient Care Conference (Signed)
Inpatient RehabilitationTeam Conference and Plan of Care Update Date: 12/08/2013   Time: 11:20 AM    Patient Name: [name]      Medical Record Number: [id number]  Date of Birth: December 18, 1921 Sex: Male         Room/Bed: 4M01C/4M01C-01 Payor Info: Payor: MEDICARE / Plan: MEDICARE PART A AND B / Product Type: *No Product type* /    Admitting Diagnosis: R MCA  Admit Date/Time:  12/02/2013  4:46 PM Admission Comments: No comment available   Primary Diagnosis:  <principal problem not specified> Principal Problem: <principal problem not specified>  Patient Active Problem List   Diagnosis Date Noted  . Atrial fibrillation with rapid ventricular response 11/29/2013  . CVA (cerebral infarction) 11/29/2013  . OTHER VITAMIN B12 DEFICIENCY ANEMIA 01/08/2008  . ANAL FISTULA 01/08/2008    Expected Discharge Date: Expected Discharge Date: 12/10/13  Team Members Present: Physician leading conference: Dr. Alysia Penna Social Worker Present: Alfonse Alpers, LCSW;Becky Taylon Louison, LCSW Nurse Present: Other (comment) Soyla Murphy) PT Present: Georjean Mode, PT;Other (comment) Jilda Roche) OT Present: Antony Salmon, Roland Earl, OT SLP Present: Germain Osgood, SLP PPS Coordinator present : Daiva Nakayama, RN, CRRN     Current Status/Progress Goal Weekly Team Focus  Medical   bradycardia  HR>60  med adjustments as per cardiology   Bowel/Bladder   cont of bowel and bladder, uses toilet  remain cont of bowel and bladder mod I  monitor   Swallow/Nutrition/ Hydration   Dys 3 textures and thin liquids with intermittent supervision  Mod I with least restrictive PO  diet tolerance, increase use of strategies   ADL's   supervision overall (occasional min assist for buttons of shirt) with verbal cues for safety awareness and energy conservation techniques  Mod I   pt education, dynamic standing balance, functional transfers, safety awareness, strengtheing, IADL tasks    Mobility    Supervision with transfers/gait in controlled environment, supervision-min A with gait/transfers in community environment  Mod I overall  Postural/gait stability in community, gait with head turns, stair negotiation, endurance, floor transfers   Communication     [facility]        Safety/Cognition/ Behavioral Observations  supervision cues for complex problem solving related to medication management task  Mod I  medication management, complex problem solving, emergent/anticipatory awareness, recall of new information   Pain   n/a         Skin   CDI, fragile, scabbed skin tear L arm OTA, scattered brusing, pre-existing L elbow "cyst"   no skin breakdown or infection while in Rehab  assess skin qshift and prn      *See Care Plan and progress notes for long and short-term goals.  Barriers to Discharge: see above    Possible Resolutions to Barriers:  see above    Discharge Planning/Teaching Needs:  Home alone with intermittent checks from lady friend and friend-Junior      Team Discussion:  Working balance issues-doing well and will reach mod/i level.  Safety awareness is the issues-but had pre-morbidly.  MD adjusting cardiac meds.  Pt feels ready to go home.  Revisions to Treatment Plan:  None   Continued Need for Acute Rehabilitation Level of Care: The patient requires daily medical management by a physician with specialized training in physical medicine and rehabilitation for the following conditions: Daily direction of a multidisciplinary physical rehabilitation program to ensure safe treatment while eliciting the highest outcome that is of practical value to the patient.: Yes Daily medical  management of patient stability for increased activity during participation in an intensive rehabilitation regime.: Yes Daily analysis of laboratory values and/or radiology reports with any subsequent need for medication adjustment of medical intervention for : Neurological problems;Cardiac  problems  Aleina Burgio, Gardiner Rhyme 12/08/2013, 1:21 PM

## 2013-12-09 ENCOUNTER — Inpatient Hospital Stay (HOSPITAL_COMMUNITY): Payer: Medicare Other

## 2013-12-09 ENCOUNTER — Inpatient Hospital Stay (HOSPITAL_COMMUNITY): Payer: Medicare Other | Admitting: Physical Therapy

## 2013-12-09 NOTE — Progress Notes (Signed)
Social Work Patient ID: [name], male   DOB: 1922/01/03, 80 y.o.   MRN: [id number] Met with pt to ask about follow up therapies, he feels this is not necessary.  He has had in the past and have not found it helpful.  Blair-PT aware and  In agreement with his decision.  Pt is ambulating over 150 feet and not using an assistive device.  Junior his friend to stay with him a few days once home. Prepare for discharge tomorrow.

## 2013-12-09 NOTE — Progress Notes (Signed)
Physical Therapy Discharge Summary  Patient Details  Name: [name] MRN: [id number] Date of Birth: 1922-09-15  Today's Date: 12/09/2013 Time: 0930-1028 Time Calculation (min): 58 min  Patient has met 10 of 10 long term goals due to improved activity tolerance, improved balance, improved postural control, increased strength, ability to compensate for deficits, functional use of  left lower extremity and improved awareness.  Patient to discharge at an ambulatory level Modified Independent.   Patient does not require care partner, as patient is currently modified independent with all functional mobility.  Recommendation:  Patient does not require follow up physical therapy, as patient is currently performing all functional mobility at a modified independent level (with increased time).  Equipment: No equipment provided  Reasons for discharge: treatment goals met and discharge from hospital  Patient/family agrees with progress made and goals achieved: Yes  Skilled Therapeutic Interventions/Progress Updates: Pt received seated in bedside chair; agreeable to therapy. Session focused on assessing/addressing pt safety/independence with functional mobility, dynamic standing balance. Pt performed the following with mod I (increased time): gait x300' in controlled, home, and community environments; supine<>sit on rehab apartment bed; sit<>stand from couch, standard chair, bedside chair, and mat table; negotiation of 12 stairs without rails, forward-facing, step-to pattern; floor transfer; car transfer. Performed Dynamic Gait Index (DGI), scoring 22/24. Performed Berg Balance Scale, scoring 52/56. See below for detailed findings of DGI and Berg. Pt educated on findings, progress, and implications of standardized balance tests. Pt verbalized understanding. Session ended in pt room, where pt was left seated in bedside chair with all needs within reach.  PT  Discharge Precautions/Restrictions Precautions Precautions: None Restrictions Weight Bearing Restrictions: No Pain Pain Assessment Pain Assessment: No/denies pain Vision/Perception  Vision - History Baseline Vision: No visual deficits Patient Visual Report: No change from baseline Vision - Assessment Eye Alignment: Within Functional Limits Vision Assessment: Vision tested Perception Perception: Within Functional Limits Praxis Praxis: Intact  Cognition Overall Cognitive Status: Within Functional Limits for tasks assessed Arousal/Alertness: Awake/alert Orientation Level: Oriented X4 Attention: Selective Sustained Attention: Appears intact Selective Attention: Appears intact Memory: Appears intact Awareness: Appears intact Problem Solving: Appears intact Safety/Judgment: Appears intact Sensation Sensation Light Touch: Appears Intact Hot/Cold: Appears Intact Proprioception: Appears Intact Coordination Gross Motor Movements are Fluid and Coordinated: No Fine Motor Movements are Fluid and Coordinated: Yes Coordination and Movement Description: Intact, equal bilaterally. Motor  Motor Motor: Within Functional Limits  Mobility Bed Mobility Bed Mobility: Supine to Sit;Sit to Supine Supine to Sit: 7: Independent;HOB flat Sit to Supine: HOB flat;7: Independent Scooting to HOB: 7: Independent Transfers Transfers: Yes Sit to Stand: 6: Modified independent (Device/Increase time);From elevated surface;With upper extremity assist Stand to Sit: 6: Modified independent (Device/Increase time);To bed;With armrests Locomotion  Ambulation Ambulation: Yes Ambulation/Gait Assistance: 6: Modified independent (Device/Increase time) Ambulation Distance (Feet): 300 Feet Assistive device: None Gait Gait: Yes Gait Pattern: Impaired Gait Pattern: Step-through pattern;Decreased stride length;Trunk flexed;Narrow base of support High Level Ambulation High Level Ambulation: Side  stepping Side Stepping: Mod I (increased time) Stairs / Additional Locomotion Stairs: Yes Stairs Assistance: 6: Modified independent (Device/Increase time) Stair Management Technique: No rails Number of Stairs: 12 Wheelchair Mobility Wheelchair Mobility: No (Pt ambulatory on unit)  Trunk/Postural Assessment  Cervical Assessment Cervical Assessment: Within Functional Limits Thoracic Assessment Thoracic Assessment: Within Functional Limits Lumbar Assessment Lumbar Assessment: Within Functional Limits Postural Control Postural Control: Within Functional Limits Righting Reactions: Hiip, ankle, and stepping strategy intact and effective with balance perturbations in all directions. Postural Limitations: Posterior pelvic  tilt in seated; weightbearing symmetrical bilaterally  Balance Balance Balance Assessed: Yes Standardized Balance Assessment Standardized Balance Assessment: Dynamic Gait Index;Berg Balance Test Berg Balance Test Sit to Stand: Able to stand without using hands and stabilize independently Standing Unsupported: Able to stand safely 2 minutes Sitting with Back Unsupported but Feet Supported on Floor or Stool: Able to sit safely and securely 2 minutes Stand to Sit: Sits safely with minimal use of hands Transfers: Able to transfer safely, minor use of hands Standing Unsupported with Eyes Closed: Able to stand 10 seconds safely Standing Ubsupported with Feet Together: Able to place feet together independently and stand 1 minute safely From Standing, Reach Forward with Outstretched Arm: Can reach forward >12 cm safely (5") From Standing Position, Pick up Object from Floor: Able to pick up shoe safely and easily From Standing Position, Turn to Look Behind Over each Shoulder: Looks behind from both sides and weight shifts well Turn 360 Degrees: Able to turn 360 degrees safely in 4 seconds or less Standing Unsupported, Alternately Place Feet on Step/Stool: Able to stand  independently and safely and complete 8 steps in 20 seconds Standing Unsupported, One Foot in Front: Able to plae foot ahead of the other independently and hold 30 seconds Standing on One Leg: Able to lift leg independently and hold equal to or more than 3 seconds Total Score: 52 Dynamic Gait Index Level Surface: Normal Change in Gait Speed: Mild Impairment (Minimal difference in slow vs baseline gait speed) Gait with Horizontal Head Turns: Normal Gait with Vertical Head Turns: Normal Gait and Pivot Turn: Normal Step Over Obstacle: Normal Step Around Obstacles: Normal Steps: Mild Impairment (step-to pattern) Total Score: 22 Static Sitting Balance Static Sitting - Balance Support: No upper extremity supported;Feet supported Static Sitting - Level of Assistance: 7: Independent Dynamic Sitting Balance Dynamic Sitting - Balance Support: Feet supported;No upper extremity supported Dynamic Sitting - Level of Assistance: 7: Independent Static Standing Balance Static Standing - Level of Assistance: 7: Independent Extremity Assessment  RUE Assessment RUE Assessment: Within Functional Limits (limited ROM PTA) LUE Assessment LUE Assessment: Within Functional Limits (limited ROM PTA) RLE Assessment RLE Assessment: Within Functional Limits LLE Assessment LLE Assessment: Exceptions to [facility] LLE Strength LLE Overall Strength: Deficits LLE Overall Strength Comments: Grossly 4/5  See FIM for current functional status  Hobble, Malva Cogan 12/09/2013, 11:41 AM

## 2013-12-09 NOTE — Progress Notes (Signed)
Speech Language Pathology Discharge Summary & Final Treatment Note  Patient Details  Name: [name] MRN: [id number] Date of Birth: 11/20/1921  Today's Date: 12/09/2013 Time: 7847-8412 Time Calculation (min): 45 min  Skilled Therapeutic Interventions:  Skilled treatment focused on education. SLP facilitated session with education regarding current level of functioning as well as recommendations for discharge. Pt verbalizing his disagreement with recommendations from his providers, verbalizing that he would rather choose quality of life over living a restricted lifestyle. Pt was agreeable to education with SLP regarding swallowing function, acknowledging some baseline dysphagia and demonstrating adequate anticipatory awareness of how to facilitate swallowing with Mod I. He was engaged and asking questions, and verbalized his understanding of all information provided.    Patient has met 4 of 4 long term goals.  Patient to discharge at overall Modified Independent level.  Reasons goals not met: N/A   Clinical Impression/Discharge Summary: Pt has met 4 out of $ LTGs during this reporting period, with functional gains noted in swallowing and cognitive function. Suspect that patient is at his baseline for oropharyngeal swallowing with suspected esophageal component, and is consuming Dys 3 textures and thin liquids demonstrating adequate carryover of safe swallowing strategies. He has demonstrated adequate complex problem solving and executive functions, and education has been completed. Patient is to discharge home with short-term full supervision. No further SLP f/u is recommended at this time.    Care Partner: Friend "Junior" to provide short-term full supervision upon discharge      Recommendation:  None      Equipment:  None recommended by SLP  Reasons for discharge: Treatment goals met;Discharged from hospital   Patient/Family Agrees with Progress Made and Goals Achieved: Yes   See  FIM for current functional status   Germain Osgood, M.A. CCC-SLP [phone]   Germain Osgood 12/09/2013, 10:58 AM

## 2013-12-09 NOTE — Discharge Summary (Signed)
Discharge summary job # [id number]

## 2013-12-09 NOTE — Discharge Summary (Signed)
NAME:  [name], [name] NO.:  [id number]  MEDICAL RECORD NO.:  [id number]  LOCATION:  4M01C                        FACILITY:  [facility]  PHYSICIAN:  Lauraine Rinne, P.A.  DATE OF BIRTH:  [birth year]  DATE OF ADMISSION:  12/02/2013 DATE OF DISCHARGE:                              DISCHARGE SUMMARY   DISCHARGE DIAGNOSES: 1. Embolic right MCA infarct. 2. Eliquis for DVT prophylaxis. 3. Dysphagia. 4. Atrial fibrillation. 5. Hyperlipidemia.  HISTORY OF PRESENT ILLNESS:  This is a 80 year old right-handed male with history of hypertension, atrial fibrillation, independent living alone, driving prior to admission.  Admitted November 29, 2013 with generalized weakness x2 days  after being found on the floor by a friend.  MRI of the brain showed large posterior and inferior right MCA division infarct without hemorrhage.  MRA of the brain with focal high- grade stenosis to the posterior inferior right M2 division.  Carotid Dopplers with no ICA stenosis.  Echocardiogram with ejection fraction of 47%, normal systolic function.  The patient did not receive tPA. Neurology Services consulted, initially placed on aspirin for CVA prophylaxis.  Follow up Cardiology Services, Dr. Einar Gip for atrial fibrillation, rapid ventricular response, maintained on amiodarone as well as Cardizem for a short time with Lopressor and started on Eliquis on December 01, 2013 for atrial fibrillation.  Maintained on a dysphagia 3 nectar thick liquid.  Physical and occupational therapy ongoing.  The patient was admitted for comprehensive rehab program.  PAST MEDICAL HISTORY:  See discharge diagnoses.  SOCIAL HISTORY:  Lives alone with family and friends to check on him.  FUNCTIONAL HISTORY:  Prior to admission independent.  FUNCTIONAL STATUS UPON ADMISSION TO REHAB SERVICES:  Ambulating 30 feet, unsteady gait, needing directional cues for safety.  PHYSICAL EXAMINATION:  VITAL SIGNS:  Blood pressure  123/70, pulse 76, temperature 98.4, respirations 14. GENERAL:  This was an alert male, oriented to person, place, time, a little bit impulsive.  Cognition generally appropriate.  He follows simple commands. LUNGS:  Clear to auscultation. CARDIAC:  Rate controlled. ABDOMEN:  Soft, nontender.  Good bowel sounds.  REHABILITATION HOSPITAL COURSE:  The patient was admitted to inpatient rehab services with therapies initiated on a 3-hour daily basis consisting of physical therapy, occupational therapy, speech therapy, and rehabilitation nursing.  The following issues were addressed during the patient's rehabilitation stay.  Pertaining to [name] embolic right MCA infarct remained stable, maintained on Eliquis.  Cardiac rate remained controlled.  No chest pain or shortness of breath.  His Lopressor was adjusted 25 mg twice daily per Cardiology Services.  His diet had been advanced to a mechanical soft with thin liquids, which he was tolerating nicely.  He remained on Lipitor for hyperlipidemia.  The patient received weekly collaborative interdisciplinary team conferences to discuss estimated length of stay, family teaching, and any barriers to discharge.  He was ambulating 100 feet x2, closed supervision, no loss of balance.  Required some cues for redirection of tasks.  He did ambulate to the bathroom with supervision level, engaged in dynamic balance activities, practiced simulated walk-in showers with supervision, modified independent level.  Full family teaching was addressed.  No driving at the time of discharge.  He was discharged to home.  DISCHARGE MEDICATIONS: 1. Amiodarone 100 mg p.o. daily. 2. Eliquis 5 mg p.o. b.i.d. 3. Lipitor 20 mg p.o. daily. 4. Lopressor 25 mg p.o. b.i.d.  DIET:  Mechanical soft thin liquids.  FOLLOWUP PLAN:  He will follow up with Dr. Alysia Penna at the outpatient rehab center as directed.  Dr. Einar Gip, Cardiology Services in 2 weeks, call  for appointment.  Dr. Leonie Man, Neurology Services in 1 month, call for appointment.  SPECIAL INSTRUCTIONS:  Ongoing therapies dictated per [employer]. No driving.     Lauraine Rinne, P.A.     DA/MEDQ  D:  12/09/2013  T:  12/09/2013  Job:  [id number]  cc:   Pramod P. Leonie Man, MD Laverda Page, MD

## 2013-12-09 NOTE — Progress Notes (Signed)
Occupational Therapy Discharge Summary  Patient Details  Name: [name] MRN: [id number] Date of Birth: April 10, 1922  Today's Date: 12/09/2013 Time: [phone] and [phone] Time Calculation (min): 58 min and 28 min   Patient has met 11 of 11 long term goals due to improved activity tolerance, improved balance, postural control, ability to compensate for deficits, improved awareness and improved coordination.  Patient to discharge at overall Modified Independent level.  Patient's care partner not necessary to provide the necessary physical and cognitive assistance at discharge. Patient has practiced meal prep and home management tasks at Mod I level. Therapist had discussion with patient's friend who will be checking on him regularly about his current level of function.   Reasons goals not met: N/A. All LTGs met  Recommendation:  Patient discharging home at Modified Independent level, no further OT services recommended.   Equipment: No equipment provided. Patient had all recommended equipment PTA.   Reasons for discharge: treatment goals met and discharge from hospital  Patient/family agrees with progress made and goals achieved: Yes  OT Skilled Therapeutic Intervention Session 1: Pt seen for ADL retraining with focus on energy conservation, safety awareness, and dynamic standing balance. Pt competed all self-care tasks at Mod I level, retrieving clothing from low drawers and dressers without difficulty. Pt with increased carryover of techniques discussed throughout rehab stay for sitting during LB dressing. Pt with min SOB during session. Pt required increased time for buttoning shirt and pt and friends have reported this was present PTA. Pt demonstrated good dynamic balance while picking up clothing items from low surfaces and placing them in bags to take home. At end of session, ambulated to family room with min cue to keep head up to be aware of individuals and objects in his  surroundings. Practiced furniture transfer at Mod I level to low couch. Pt returned to room and left with all items in reach.   Session 2: Pt seen for 1:1 skilled OT session with focus on community re-integration, dynamic standing balance, overall awareness, and toileting task. Pt received sitting in recliner chair reporting feeling "weary" and willing to participate after min cueing. Pt asked to recall room number and assisted with min cues to lobby. Pt required visual and verbal cues to locate signs in all visual fields with emphasis on dynamic balance during head turns. Pt with good awareness while ambulating around tables with shopping items and crowded environment. Pt with good socialization skills as well. Pt directed to lead therapist back to room with min cues to select appropriate buttons. Pt with increased difficulty once on 4th floor to locate direction of room with min cues to use signs in hallways. After increased time pt successfully located room. Pt with minor LOB on a few occasions however able to self-correct. Completed toilet task at Mod I level and left sitting in recliner chair with all needs in reach.   OT Discharge Precautions/Restrictions  Precautions Precautions: None Restrictions Weight Bearing Restrictions: No General   Vital Signs   Pain Pain Assessment Pain Assessment: No/denies pain ADL   Vision/Perception  Vision - History Baseline Vision: No visual deficits Patient Visual Report: No change from baseline Vision - Assessment Eye Alignment: Within Functional Limits Vision Assessment: Vision tested Perception Perception: Within Functional Limits Praxis Praxis: Intact  Cognition Overall Cognitive Status: Within Functional Limits for tasks assessed Arousal/Alertness: Awake/alert Orientation Level: Oriented X4 Attention: Selective Sustained Attention: Appears intact Selective Attention: Appears intact Memory: Appears intact Awareness: Appears  intact Problem Solving: Appears  intact Safety/Judgment: Appears intact Sensation Sensation Light Touch: Appears Intact Hot/Cold: Appears Intact Proprioception: Appears Intact Coordination Gross Motor Movements are Fluid and Coordinated: No Fine Motor Movements are Fluid and Coordinated: Yes Coordination and Movement Description: Intact, equal bilaterally. Motor  Motor Motor: Within Functional Limits Mobility  Bed Mobility Bed Mobility: Supine to Sit;Sit to Supine Supine to Sit: 7: Independent;HOB flat Sit to Supine: HOB flat;7: Independent Scooting to HOB: 7: Independent Transfers Sit to Stand: 6: Modified independent (Device/Increase time);From elevated surface;With upper extremity assist Stand to Sit: 6: Modified independent (Device/Increase time);To bed;With armrests  Trunk/Postural Assessment  Cervical Assessment Cervical Assessment: Within Functional Limits Thoracic Assessment Thoracic Assessment: Within Functional Limits Lumbar Assessment Lumbar Assessment: Within Functional Limits Postural Control Postural Control: Within Functional Limits Righting Reactions: Hiip, ankle, and stepping strategy intact and effective with balance perturbations in all directions. Postural Limitations: Posterior pelvic tilt in seated; weightbearing symmetrical bilaterally  Balance Balance Balance Assessed: Yes Standardized Balance Assessment Standardized Balance Assessment: Dynamic Gait Index;Berg Balance Test Berg Balance Test Sit to Stand: Able to stand without using hands and stabilize independently Standing Unsupported: Able to stand safely 2 minutes Sitting with Back Unsupported but Feet Supported on Floor or Stool: Able to sit safely and securely 2 minutes Stand to Sit: Sits safely with minimal use of hands Transfers: Able to transfer safely, minor use of hands Standing Unsupported with Eyes Closed: Able to stand 10 seconds safely Standing Ubsupported with Feet Together: Able to  place feet together independently and stand 1 minute safely From Standing, Reach Forward with Outstretched Arm: Can reach forward >12 cm safely (5") From Standing Position, Pick up Object from Floor: Able to pick up shoe safely and easily From Standing Position, Turn to Look Behind Over each Shoulder: Looks behind from both sides and weight shifts well Turn 360 Degrees: Able to turn 360 degrees safely in 4 seconds or less Standing Unsupported, Alternately Place Feet on Step/Stool: Able to stand independently and safely and complete 8 steps in 20 seconds Standing Unsupported, One Foot in Front: Able to plae foot ahead of the other independently and hold 30 seconds Standing on One Leg: Able to lift leg independently and hold equal to or more than 3 seconds Total Score: 52 Dynamic Gait Index Level Surface: Normal Change in Gait Speed: Mild Impairment (Minimal difference in slow vs baseline gait speed) Gait with Horizontal Head Turns: Normal Gait with Vertical Head Turns: Normal Gait and Pivot Turn: Normal Step Over Obstacle: Normal Step Around Obstacles: Normal Steps: Mild Impairment (step-to pattern) Total Score: 22 Extremity/Trunk Assessment RUE Assessment RUE Assessment: Within Functional Limits (limited ROM PTA) LUE Assessment LUE Assessment: Within Functional Limits (limited ROM PTA)  See FIM for current functional status  Ivah Girardot, Quillian Quince 12/09/2013, 11:39 AM

## 2013-12-09 NOTE — Discharge Instructions (Signed)
Inpatient Rehab Discharge Instructions  [name] Discharge date and time: No discharge date for patient encounter.   Activities/Precautions/ Functional Status: Activity: activity as tolerated Diet: Mechanical soft Wound Care: none needed Functional status:  ___ No restrictions     ___ Walk up steps independently ___ 24/7 supervision/assistance   ___ Walk up steps with assistance ___ Intermittent supervision/assistance  ___ Bathe/dress independently ___ Walk with walker     ___ Bathe/dress with assistance ___ Walk Independently    ___ Shower independently __x_ Walk with assistance    ___ Shower with assistance ___ No alcohol     ___ Return to work/school ________  Special Instructions: No driving    COMMUNITY REFERRALS UPON DISCHARGE:   None-RECOMMENDED AND PT REFUSED ALSO Medical Equipment/Items Ordered:NONE NEEDED   GENERAL COMMUNITY RESOURCES FOR PATIENT/FAMILY: Support Groups:[facility] SUPPORT GROUP  STROKE/TIA DISCHARGE INSTRUCTIONS SMOKING Cigarette smoking nearly doubles your risk of having a stroke & is the single most alterable risk factor  If you smoke or have smoked in the last 12 months, you are advised to quit smoking for your health.  Most of the excess cardiovascular risk related to smoking disappears within a year of stopping.  Ask you doctor about anti-smoking medications  [place] Quit Line: 1-800-QUIT NOW  Free Smoking Cessation Classes [phone]  CHOLESTEROL Know your levels; limit fat & cholesterol in your diet  Lipid Panel     Component Value Date/Time   CHOL 140 11/30/2013 0245   TRIG 98 11/30/2013 0245   HDL 49 11/30/2013 0245   CHOLHDL 2.9 11/30/2013 0245   VLDL 20 11/30/2013 0245   LDLCALC 71 11/30/2013 0245      Many patients benefit from treatment even if their cholesterol is at goal.  Goal: Total Cholesterol (CHOL) less than 160  Goal:  Triglycerides (TRIG) less than 150  Goal:  HDL greater than 40  Goal:  LDL (LDLCALC) less than 100     BLOOD PRESSURE American Stroke Association blood pressure target is less that 120/80 mm/Hg  Your discharge blood pressure is:  BP: 98/55 mmHg  Monitor your blood pressure  Limit your salt and alcohol intake  Many individuals will require more than one medication for high blood pressure  DIABETES (A1c is a blood sugar average for last 3 months) Goal HGBA1c is under 7% (HBGA1c is blood sugar average for last 3 months)  Diabetes: No known diagnosis of diabetes    Lab Results  Component Value Date   HGBA1C 5.7* 11/30/2013     Your HGBA1c can be lowered with medications, healthy diet, and exercise.  Check your blood sugar as directed by your physician  Call your physician if you experience unexplained or low blood sugars.  PHYSICAL ACTIVITY/REHABILITATION Goal is 30 minutes at least 4 days per week  Activity: No driving, Therapies: Physical Therapy: Home Health Return to work:   Activity decreases your risk of heart attack and stroke and makes your heart stronger.  It helps control your weight and blood pressure; helps you relax and can improve your mood.  Participate in a regular exercise program.  Talk with your doctor about the best form of exercise for you (dancing, walking, swimming, cycling).  DIET/WEIGHT Goal is to maintain a healthy weight  Your discharge diet is: Dysphagia  liquids Your height is:    Your current weight is:   Your Body Mass Index (BMI) is:     Following the type of diet specifically designed for you will help prevent  another stroke.  Your goal weight range is:    Your goal Body Mass Index (BMI) is 19-24.  Healthy food habits can help reduce 3 risk factors for stroke:  High cholesterol, hypertension, and excess weight.  RESOURCES Stroke/Support Group:  Call [phone]   STROKE EDUCATION PROVIDED/REVIEWED AND GIVEN TO PATIENT Stroke warning signs and symptoms How to activate emergency medical system (call 911). Medications prescribed at  discharge. Need for follow-up after discharge. Personal risk factors for stroke. Pneumonia vaccine given: Flu vaccine given: No My questions have been answered, the writing is legible, and I understand these instructions.  I will adhere to these goals & educational materials that have been provided to me after my discharge from the hospital.       My questions have been answered and I understand these instructions. I will adhere to these goals and the provided educational materials after my discharge from the hospital.  Patient/Caregiver Signature _______________________________ Date __________  Clinician Signature _______________________________________ Date __________  Please bring this form and your medication list with you to all your follow-up doctor's appointments.

## 2013-12-09 NOTE — Progress Notes (Addendum)
80 y.o. right-handed male with history of hypertension, atrial fibrillation as well as CVA 2009 with little residual. Patient independent living alone and driving prior to admission. Admitted 11/29/2013 with generalized weakness x2 days after being found in the floor by a friend. MRI of the brain showed large posterior and inferior right MCA division infarct without hemorrhage. MRA of the brain with focal high grade stenosis of the posterior inferior right M2 division. Carotid Dopplers with no ICA stenosis. Echocardiogram with ejection fraction of 55% with normal systolic function. Patient did not receive TPA. Neurology services consulted maintained on aspirin for CVA prophylaxis. Followup cardiology services(Dr. Einar Gip) for atrial fibrillation with rapid ventricular response maintained on amiodarone/ Cardizem and metoprolol And started on eliquis 12/01/2013 for atrial fibrillation. Currently on dysphagia 3 nectar thick liquid diet   Subjective/Complaints: Questions regarding driving Review of Systems - Negative except left shoulder weakness, decreased ROM both shoulders Objective: Vital Signs: Blood pressure 98/55, pulse 63, temperature 97.9 F (36.6 C), temperature source Oral, resp. rate 20, SpO2 98.00%. No results found. No results found for this or any previous visit (from the past 72 hour(s)).   HEENT: normal Cardio: irregular and no murmur Resp: CTA B/L and unlabored GI: BS positive and non distended Extremity:  Pulses positive and No Edema Skin:   Intact Neuro: Alert/Oriented, Cranial Nerve II-XII normal, Abnormal Motor 4/5 LUE otherwise 5/5 and Abnormal FMC Ataxic/ dec FMC, diminished attn to left on confrontation Musc/Skel:  Other Left Shoulder sublux, Bilatera AROM shoulder abd 80 degrees Gen NAD   Assessment/Plan: 1. Functional deficits secondary to embolic R MCA infarct which require 3+ hours per day of interdisciplinary therapy in a comprehensive inpatient rehab  setting. Physiatrist is providing close team supervision and 24 hour management of active medical problems listed below. Physiatrist and rehab team continue to assess barriers to discharge/monitor patient progress toward functional and medical goals. Discussed no driving , has Left neglect FIM: FIM - Bathing Bathing Steps Patient Completed: Chest;Front perineal area;Right lower leg (including foot);Left lower leg (including foot);Buttocks;Right Arm;Left Arm;Right upper leg;Left upper leg;Abdomen Bathing: 5: Supervision: Safety issues/verbal cues (verbal cues)  FIM - Upper Body Dressing/Undressing Upper body dressing/undressing steps patient completed: Thread/unthread right sleeve of pullover shirt/dresss;Thread/unthread left sleeve of pullover shirt/dress;Pull shirt over trunk;Put head through opening of pull over shirt/dress;Thread/unthread left sleeve of front closure shirt/dress;Pull shirt around back of front closure shirt/dress;Thread/unthread right sleeve of front closure shirt/dress Upper body dressing/undressing: 4: Min-Patient completed 75 plus % of tasks FIM - Lower Body Dressing/Undressing Lower body dressing/undressing steps patient completed: Thread/unthread right underwear leg;Thread/unthread left underwear leg;Thread/unthread right pants leg;Thread/unthread left pants leg;Pull pants up/down;Pull underwear up/down;Don/Doff right sock;Don/Doff left sock;Fasten/unfasten pants (required assist d/t decreased time) Lower body dressing/undressing: 4: Min-Patient completed 75 plus % of tasks  FIM - Toileting Toileting steps completed by patient: Adjust clothing prior to toileting;Performs perineal hygiene;Adjust clothing after toileting Toileting Assistive Devices: Grab bar or rail for support Toileting: 6: More than reasonable amount of time  FIM - Radio producer Devices: Grab bars Toilet Transfers: 6-Assistive device: No helper  FIM - Financial trader Devices:  (none) Bed/Chair Transfer: 6: More than reasonable amt of time  FIM - Locomotion: Wheelchair Locomotion: Wheelchair: 0: Activity did not occur FIM - Locomotion: Ambulation Locomotion: Ambulation Assistive Devices: Other (comment) (none) Ambulation/Gait Assistance: 5: Supervision (close supervision) Locomotion: Ambulation: 5: Travels 150 ft or more with supervision/safety issues  Comprehension Comprehension Mode: Auditory Comprehension: 6-Follows complex conversation/direction: With  extra time/assistive device  Expression Expression Mode: Verbal Expression: 6-Expresses complex ideas: With extra time/assistive device  Social Interaction Social Interaction: 7-Interacts appropriately with others - No medications needed.  Problem Solving Problem Solving: 5-Solves basic problems: With no assist  Memory Memory: 5-Recognizes or recalls 90% of the time/requires cueing < 10% of the time  Medical Problem List and Plan:  1. Embolic hard right MCA infarct currently Sup level 2. DVT Prophylaxis/Anticoagulation: Eliquis  3. Pain Management: Tylenol as needed  4. Neuropsych: This patient is capable of making decisions on his own behalf.  5. Dysphagia. Dysphagia 3 nectar liquids. Monitor hydration. Followup speech therapy  6. Atrial fibrillation. D/C Cardizem CD 180 mg daily,Cont  Lopressor 25 mg twice a day and reduce  amiodarone 100 mg  a day.  discussed with cardiology  7. Hyperlipidemia. Lipitor    LOS (Days) 7 A FACE TO FACE EVALUATION WAS PERFORMED  [name] E 12/09/2013, 8:01 AM

## 2013-12-10 DIAGNOSIS — I6340 Cerebral infarction due to embolism of unspecified cerebral artery: Secondary | ICD-10-CM

## 2013-12-10 DIAGNOSIS — G8110 Spastic hemiplegia affecting unspecified side: Secondary | ICD-10-CM

## 2013-12-10 MED ORDER — METOPROLOL TARTRATE 25 MG PO TABS: 25.0000 mg | ORAL_TABLET | Freq: Two times a day (BID) | ORAL | Status: DC

## 2013-12-10 MED ORDER — PRAVASTATIN SODIUM 20 MG PO TABS: 20.0000 mg | ORAL_TABLET | Freq: Every day | ORAL | Status: AC

## 2013-12-10 MED ORDER — APIXABAN 5 MG PO TABS: 5.0000 mg | ORAL_TABLET | Freq: Two times a day (BID) | ORAL | Status: DC

## 2013-12-10 MED ORDER — AMIODARONE HCL 100 MG PO TABS: 100.0000 mg | ORAL_TABLET | Freq: Every day | ORAL | Status: DC

## 2013-12-10 NOTE — Progress Notes (Signed)
80 y.o. right-handed male with history of hypertension, atrial fibrillation as well as CVA 2009 with little residual. Patient independent living alone and driving prior to admission. Admitted 11/29/2013 with generalized weakness x2 days after being found in the floor by a friend. MRI of the brain showed large posterior and inferior right MCA division infarct without hemorrhage   Subjective/Complaints: Discussed D/C Review of Systems - Negative except left shoulder weakness, decreased ROM both shoulders Objective: Vital Signs: Blood pressure 107/72, pulse 58, temperature 98 F (36.7 C), temperature source Oral, resp. rate 17, SpO2 97.00%. No results found. No results found for this or any previous visit (from the past 72 hour(s)).   HEENT: normal Cardio: irregular and no murmur Resp: CTA B/L and unlabored GI: BS positive and non distended Extremity:  Pulses positive and No Edema Skin:   Intact Neuro: Alert/Oriented, Cranial Nerve II-XII normal, Abnormal Motor 4/5 LUE otherwise 5/5 and Abnormal FMC Ataxic/ dec FMC, diminished attn to left on confrontation Musc/Skel:  Other Left Shoulder sublux, Bilatera AROM shoulder abd 80 degrees Gen NAD   Assessment/Plan: 1. Functional deficits secondary to embolic R MCA infarct which require 3+ hours per day of interdisciplinary therapy in a comprehensive inpatient rehab setting. Stable for D/C today F/u PCP in 1-2 weeks F/u Cardiology F/u PM&R 3 weeks See D/C summary See D/C instructions Discussed no driving , has Left neglect FIM: FIM - Bathing Bathing Steps Patient Completed: Chest;Front perineal area;Right lower leg (including foot);Left lower leg (including foot);Buttocks;Right Arm;Left Arm;Right upper leg;Left upper leg;Abdomen Bathing: 6: More than reasonable amount of time  FIM - Upper Body Dressing/Undressing Upper body dressing/undressing steps patient completed: Thread/unthread right sleeve of pullover shirt/dresss;Thread/unthread  left sleeve of pullover shirt/dress;Pull shirt over trunk;Put head through opening of pull over shirt/dress;Thread/unthread left sleeve of front closure shirt/dress;Pull shirt around back of front closure shirt/dress;Thread/unthread right sleeve of front closure shirt/dress;Button/unbutton shirt Upper body dressing/undressing: 6: More than reasonable amount of time FIM - Lower Body Dressing/Undressing Lower body dressing/undressing steps patient completed: Thread/unthread right underwear leg;Thread/unthread left underwear leg;Thread/unthread right pants leg;Thread/unthread left pants leg;Pull pants up/down;Pull underwear up/down;Don/Doff right sock;Don/Doff left sock;Fasten/unfasten pants;Don/Doff right shoe;Don/Doff left shoe;Fasten/unfasten right shoe;Fasten/unfasten left shoe Lower body dressing/undressing: 6: More than reasonable amount of time  FIM - Toileting Toileting steps completed by patient: Adjust clothing prior to toileting;Adjust clothing after toileting;Performs perineal hygiene Toileting Assistive Devices: Grab bar or rail for support Toileting: 6: More than reasonable amount of time  FIM - Radio producer Devices: Grab bars Toilet Transfers: 6-More than reasonable amt of time  FIM - Control and instrumentation engineer Devices:  (none) Bed/Chair Transfer: 6: More than reasonable amt of time  FIM - Locomotion: Wheelchair Locomotion: Wheelchair: 0: Activity did not occur (Pt ambulatory on unit) FIM - Locomotion: Ambulation Locomotion: Ambulation Assistive Devices: Other (comment) (none) Ambulation/Gait Assistance: 6: Modified independent (Device/Increase time) Locomotion: Ambulation: 6: Travels 150 ft or more independently/takes more than reasonable amount of time  Comprehension Comprehension Mode: Auditory Comprehension: 6-Follows complex conversation/direction: With extra time/assistive device  Expression Expression Mode:  Verbal Expression: 6-Expresses complex ideas: With extra time/assistive device  Social Interaction Social Interaction: 7-Interacts appropriately with others - No medications needed.  Problem Solving Problem Solving: 6-Solves complex problems: With extra time  Memory Memory: 6-More than reasonable amt of time  Medical Problem List and Plan:  1. Embolic  right MCA infarct currently Sup level 2. DVT Prophylaxis/Anticoagulation: Eliquis  3. Pain Management: Tylenol as needed  4. Neuropsych:  This patient is capable of making decisions on his own behalf.  5. Dysphagia. Dysphagia 3 nectar liquids. Monitor hydration. Followup speech therapy  6. Atrial fibrillation. D/C Cardizem CD 180 mg daily,Cont  Lopressor 25 mg twice a day and reduce  amiodarone 100 mg  a day.   7. Hyperlipidemia. Lipitor    LOS (Days) 8 A FACE TO FACE EVALUATION WAS PERFORMED  Mechille Varghese E 12/10/2013, 7:29 AM

## 2013-12-10 NOTE — Progress Notes (Signed)
Social Work Discharge Note Discharge Note  The overall goal for the admission was met for:   Discharge location: [place]  Length of Stay: Yes-8 DAYS  Discharge activity level: Yes-INDEPENDENT LEVEL  Home/community participation: Yes  Services provided included: MD, RD, PT, OT, SLP, RN, CM, TR, Pharmacy and SW  Financial Services: Medicare and Private Insurance: [place]  Follow-up services arranged: Other: PT REFUSED FOLLOW UP AND PT AGREEABLE TO PLAN  Comments (or additional information):PT DID WELL HERE, FEELS [facility] AND OP ARE NOT NECESSARY, HAD BEFORE WAS NOT HELPFUL DOES WHAT HE WANTS, TOLD BY MD NO DRIVING  Patient/Family verbalized understanding of follow-up arrangements: Yes  Individual responsible for coordination of the follow-up plan: SELF & MAXINE-LADY FRIEND  Confirmed correct DME delivered: Elease Hashimoto 12/10/2013    Elease Hashimoto

## 2013-12-10 NOTE — Progress Notes (Signed)
Patient and family member received written and verbal discharge instructions from [place], [place]. Aware of follow up appointments and medications, denies any questions or concerns. Personal belongings packed by pt and staff. Patient taken down to private vehicle by NT via wheelchair. [place], Remer Couse Selinda Eon

## 2013-12-20 DIAGNOSIS — I359 Nonrheumatic aortic valve disorder, unspecified: Secondary | ICD-10-CM | POA: Diagnosis not present

## 2013-12-20 DIAGNOSIS — Z8673 Personal history of transient ischemic attack (TIA), and cerebral infarction without residual deficits: Secondary | ICD-10-CM | POA: Diagnosis not present

## 2013-12-20 DIAGNOSIS — I4891 Unspecified atrial fibrillation: Secondary | ICD-10-CM | POA: Diagnosis not present

## 2013-12-20 DIAGNOSIS — R0989 Other specified symptoms and signs involving the circulatory and respiratory systems: Secondary | ICD-10-CM | POA: Diagnosis not present

## 2013-12-27 ENCOUNTER — Encounter: Payer: Self-pay | Admitting: Neurology

## 2014-01-10 ENCOUNTER — Ambulatory Visit (HOSPITAL_BASED_OUTPATIENT_CLINIC_OR_DEPARTMENT_OTHER): Payer: Medicare Other | Admitting: Physical Medicine & Rehabilitation

## 2014-01-10 ENCOUNTER — Encounter: Payer: Medicare Other | Attending: Physical Medicine & Rehabilitation

## 2014-01-10 ENCOUNTER — Encounter: Payer: Self-pay | Admitting: Physical Medicine & Rehabilitation

## 2014-01-10 VITALS — BP 111/61 | HR 66 | Resp 14 | Ht 65.0 in | Wt 146.0 lb

## 2014-01-10 DIAGNOSIS — I639 Cerebral infarction, unspecified: Secondary | ICD-10-CM

## 2014-01-10 DIAGNOSIS — R1310 Dysphagia, unspecified: Secondary | ICD-10-CM | POA: Diagnosis not present

## 2014-01-10 DIAGNOSIS — I10 Essential (primary) hypertension: Secondary | ICD-10-CM | POA: Insufficient documentation

## 2014-01-10 DIAGNOSIS — I6350 Cerebral infarction due to unspecified occlusion or stenosis of unspecified cerebral artery: Secondary | ICD-10-CM

## 2014-01-10 DIAGNOSIS — I69991 Dysphagia following unspecified cerebrovascular disease: Secondary | ICD-10-CM | POA: Insufficient documentation

## 2014-01-10 DIAGNOSIS — I4891 Unspecified atrial fibrillation: Secondary | ICD-10-CM | POA: Insufficient documentation

## 2014-01-10 DIAGNOSIS — E785 Hyperlipidemia, unspecified: Secondary | ICD-10-CM | POA: Diagnosis not present

## 2014-01-10 NOTE — Patient Instructions (Signed)
No driving until you have pased eye examination on April 6  If you've passed your eye exam  your instructions are:  Graduated return to driving instructions were provided. It is recommended that the patient first drives with another licensed driver in an empty parking lot. If the patient does well with this, and they can drive on a quiet street with the licensed driver. If the patient does well with this they can drive on a busy street with a licensed driver. If the patient does well with this, the next time out they can go by himself. For the first month after resuming driving, I recommend no nighttime or Interstate driving.

## 2014-01-10 NOTE — Progress Notes (Signed)
Subjective:    Patient ID: [name], male    DOB: 13-Jan-1922, 80 y.o.   MRN: [id number] This is a 80 year old right-handed male  with history of hypertension, atrial fibrillation, independent living  alone, driving prior to admission. Admitted November 29, 2013 with  generalized weakness x2 days after being found on the floor by a  friend. MRI of the brain showed large posterior and inferior right MCA  division infarct without hemorrhage. MRA of the brain with focal high-  grade stenosis to the posterior inferior right M2 division. Carotid  Dopplers with no ICA stenosis. Echocardiogram with ejection fraction of  40%, normal systolic function. The patient did not receive tPA.  Neurology Services consulted, initially placed on aspirin for CVA  prophylaxis. Follow up Cardiology Services, Dr. Einar Gip for atrial  fibrillation, rapid ventricular response, maintained on amiodarone as  well as Cardizem for a short time with Lopressor and started on Eliquis  on December 01, 2013 for atrial fibrillation. Maintained on a dysphagia  3 nectar thick liquid.   HPI Now on normal liquids and solids Patient living by himself. 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 Finished with home health PT and OT. Has appointment with primary care physician every 6-12 months Sees cardiology more frequently [facility] Pain Inventory Average Pain 0 Pain Right Now 0 My pain is no pain  In the last 24 hours, has pain interfered with the following? General activity 0 Relation with others 0 Enjoyment of life 0 What TIME of day is your pain at its worst? no pain Sleep (in general) Good  Pain is worse with: no pain Pain improves with: no pain Relief from Meds: no pain meds  Mobility walk without assistance how many minutes can you walk? 2-5 ability to climb steps?  yes transfers  alone  Function retired  Neuro/Psych No problems in this area  Prior Studies Any changes since last visit?  no  Physicians involved in your care Any changes since last visit?  no   No family history on file. History   Social History  . Marital Status: Widowed    Spouse Name: N/A    Number of Children: N/A  . Years of Education: N/A   Social History Main Topics  . Smoking status: Never Smoker   . Smokeless tobacco: None  . Alcohol Use: No  . Drug Use: No  . Sexual Activity: None   Other Topics Concern  . None   Social History Narrative  . None   Past Surgical History  Procedure Laterality Date  . Joint replacement     Past Medical History  Diagnosis Date  . Hypertension   . Hyperlipidemia   . Atrial fibrillation    BP 111/61  Pulse 66  Resp 14  Ht 5\' 5"  (1.651 m)  Wt 146 lb (66.225 kg)  BMI 24.30 kg/m2  SpO2 97%  Opioid Risk Score:   Fall Risk Score: Moderate Fall Risk (6-13 points) (pt educated on fall risk)    Review of Systems  All other systems reviewed and are negative.       Objective:   Physical Exam Motor strength is 3 minus bilateral deltoid related to range of motion 5/5 bilateral bicep triceps grip 5/5 bilateral hip flexors knee extensors ankle dorsiflexors and plantar flexors Visual fields are intact to confrontation testing Romberg is negative Extraocular motions are intact Ambulates without assistive device no  evidence of toe drag or knee instability.       Assessment & Plan:  1. Right posterior branch MCA infarct related to atrial fibrillation. Has had excellent recovery. Functioning at independent level Was driving PTA without restrictions No obvious residual deficit on Neuro exam, pt has optho f/u appt in 2 weeks No driving unless cleared by optho RTC PRN

## 2014-01-19 DIAGNOSIS — R002 Palpitations: Secondary | ICD-10-CM | POA: Diagnosis not present

## 2014-02-02 DIAGNOSIS — I359 Nonrheumatic aortic valve disorder, unspecified: Secondary | ICD-10-CM | POA: Diagnosis not present

## 2014-02-02 DIAGNOSIS — N529 Male erectile dysfunction, unspecified: Secondary | ICD-10-CM | POA: Diagnosis not present

## 2014-02-02 DIAGNOSIS — Z8673 Personal history of transient ischemic attack (TIA), and cerebral infarction without residual deficits: Secondary | ICD-10-CM | POA: Diagnosis not present

## 2014-02-02 DIAGNOSIS — I4891 Unspecified atrial fibrillation: Secondary | ICD-10-CM | POA: Diagnosis not present

## 2014-04-06 DIAGNOSIS — R404 Transient alteration of awareness: Secondary | ICD-10-CM | POA: Diagnosis not present

## 2014-04-06 DIAGNOSIS — R42 Dizziness and giddiness: Secondary | ICD-10-CM | POA: Diagnosis not present

## 2014-04-11 ENCOUNTER — Ambulatory Visit: Payer: Self-pay | Admitting: Nurse Practitioner

## 2014-05-24 DIAGNOSIS — Z8673 Personal history of transient ischemic attack (TIA), and cerebral infarction without residual deficits: Secondary | ICD-10-CM | POA: Diagnosis not present

## 2014-05-24 DIAGNOSIS — I4891 Unspecified atrial fibrillation: Secondary | ICD-10-CM | POA: Diagnosis not present

## 2014-05-24 DIAGNOSIS — Z7901 Long term (current) use of anticoagulants: Secondary | ICD-10-CM | POA: Diagnosis not present

## 2014-05-24 DIAGNOSIS — I359 Nonrheumatic aortic valve disorder, unspecified: Secondary | ICD-10-CM | POA: Diagnosis not present

## 2014-06-01 ENCOUNTER — Emergency Department (HOSPITAL_COMMUNITY)
Admission: EM | Admit: 2014-06-01 | Discharge: 2014-06-02 | Disposition: A | Discharge status: Home / Self Care | Payer: Medicare Other | Attending: Emergency Medicine | Admitting: Emergency Medicine

## 2014-06-01 ENCOUNTER — Emergency Department (HOSPITAL_COMMUNITY): Payer: Medicare Other

## 2014-06-01 ENCOUNTER — Encounter (HOSPITAL_COMMUNITY): Payer: Self-pay | Admitting: Emergency Medicine

## 2014-06-01 DIAGNOSIS — Y9389 Activity, other specified: Secondary | ICD-10-CM | POA: Diagnosis not present

## 2014-06-01 DIAGNOSIS — W312XXA Contact with powered woodworking and forming machines, initial encounter: Secondary | ICD-10-CM | POA: Insufficient documentation

## 2014-06-01 DIAGNOSIS — S3981XA Other specified injuries of abdomen, initial encounter: Secondary | ICD-10-CM | POA: Diagnosis not present

## 2014-06-01 DIAGNOSIS — I10 Essential (primary) hypertension: Secondary | ICD-10-CM | POA: Insufficient documentation

## 2014-06-01 DIAGNOSIS — Y9289 Other specified places as the place of occurrence of the external cause: Secondary | ICD-10-CM | POA: Insufficient documentation

## 2014-06-01 DIAGNOSIS — Z7901 Long term (current) use of anticoagulants: Secondary | ICD-10-CM | POA: Diagnosis not present

## 2014-06-01 DIAGNOSIS — Z79899 Other long term (current) drug therapy: Secondary | ICD-10-CM | POA: Diagnosis not present

## 2014-06-01 DIAGNOSIS — S31109A Unspecified open wound of abdominal wall, unspecified quadrant without penetration into peritoneal cavity, initial encounter: Secondary | ICD-10-CM | POA: Diagnosis not present

## 2014-06-01 DIAGNOSIS — E785 Hyperlipidemia, unspecified: Secondary | ICD-10-CM | POA: Insufficient documentation

## 2014-06-01 DIAGNOSIS — I4891 Unspecified atrial fibrillation: Secondary | ICD-10-CM | POA: Insufficient documentation

## 2014-06-01 DIAGNOSIS — S31119A Laceration without foreign body of abdominal wall, unspecified quadrant without penetration into peritoneal cavity, initial encounter: Secondary | ICD-10-CM

## 2014-06-01 LAB — I-STAT CHEM 8, ED
BUN: 31 mg/dL — ABNORMAL HIGH (ref 6–23)
Calcium, Ion: 1.13 mmol/L (ref 1.13–1.30)
Chloride: 109 mEq/L (ref 96–112)
Creatinine, Ser: 1.5 mg/dL — ABNORMAL HIGH (ref 0.50–1.35)
Glucose, Bld: 81 mg/dL (ref 70–99)
HCT: 35 % — ABNORMAL LOW (ref 39.0–52.0)
Hemoglobin: 11.9 g/dL — ABNORMAL LOW (ref 13.0–17.0)
Potassium: 4.5 mEq/L (ref 3.7–5.3)
Sodium: 142 mEq/L (ref 137–147)
TCO2: 21 mmol/L (ref 0–100)

## 2014-06-01 LAB — CBC WITH DIFFERENTIAL/PLATELET
Basophils Absolute: 0 10*3/uL (ref 0.0–0.1)
Basophils Relative: 0 % (ref 0–1)
Eosinophils Absolute: 0.1 10*3/uL (ref 0.0–0.7)
Eosinophils Relative: 1 % (ref 0–5)
HCT: 36.7 % — ABNORMAL LOW (ref 39.0–52.0)
Hemoglobin: 12.1 g/dL — ABNORMAL LOW (ref 13.0–17.0)
Lymphocytes Relative: 22 % (ref 12–46)
Lymphs Abs: 1.6 10*3/uL (ref 0.7–4.0)
MCH: 32.5 pg (ref 26.0–34.0)
MCHC: 33 g/dL (ref 30.0–36.0)
MCV: 98.7 fL (ref 78.0–100.0)
Monocytes Absolute: 0.8 10*3/uL (ref 0.1–1.0)
Monocytes Relative: 12 % (ref 3–12)
Neutro Abs: 4.5 10*3/uL (ref 1.7–7.7)
Neutrophils Relative %: 65 % (ref 43–77)
Platelets: 247 10*3/uL (ref 150–400)
RBC: 3.72 MIL/uL — ABNORMAL LOW (ref 4.22–5.81)
RDW: 13.7 % (ref 11.5–15.5)
WBC: 7 10*3/uL (ref 4.0–10.5)

## 2014-06-01 LAB — COMPREHENSIVE METABOLIC PANEL
ALT: 15 U/L (ref 0–53)
AST: 25 U/L (ref 0–37)
Albumin: 3.7 g/dL (ref 3.5–5.2)
Alkaline Phosphatase: 64 U/L (ref 39–117)
Anion gap: 14 (ref 5–15)
BUN: 29 mg/dL — ABNORMAL HIGH (ref 6–23)
CO2: 22 mEq/L (ref 19–32)
Calcium: 9 mg/dL (ref 8.4–10.5)
Chloride: 107 mEq/L (ref 96–112)
Creatinine, Ser: 1.63 mg/dL — ABNORMAL HIGH (ref 0.50–1.35)
GFR calc Af Amer: 41 mL/min — ABNORMAL LOW (ref >90)
GFR calc non Af Amer: 35 mL/min — ABNORMAL LOW (ref >90)
Glucose, Bld: 85 mg/dL (ref 70–99)
Potassium: 4.9 mEq/L (ref 3.7–5.3)
Sodium: 143 mEq/L (ref 137–147)
Total Bilirubin: 0.4 mg/dL (ref 0.3–1.2)
Total Protein: 7.1 g/dL (ref 6.0–8.3)

## 2014-06-01 MED ORDER — SODIUM CHLORIDE 0.9 % IV BOLUS (SEPSIS)
1000.0000 mL | Freq: Once | INTRAVENOUS | Status: AC
  Administered 2014-06-01: 1000 mL via INTRAVENOUS

## 2014-06-01 MED ORDER — TETANUS-DIPHTH-ACELL PERTUSSIS 5-2.5-18.5 LF-MCG/0.5 IM SUSP
0.5000 mL | Freq: Once | INTRAMUSCULAR | Status: DC
  Filled 2014-06-01: qty 0.5

## 2014-06-01 MED ORDER — IOHEXOL 300 MG/ML  SOLN
25.0000 mL | INTRAMUSCULAR | Status: AC
  Administered 2014-06-01: 25 mL via ORAL

## 2014-06-01 NOTE — ED Notes (Signed)
See physical diagram for description of wounds

## 2014-06-01 NOTE — ED Notes (Signed)
Pt was working with a saw then he cut some wood and the wood punctured RLQ abd. Pt is on blood thinners. Currently bleeding from site, pressure applied to site. MD made aware.

## 2014-06-01 NOTE — ED Notes (Signed)
Prior to being transported to CT, patient ambulated to restroom without difficulty.

## 2014-06-01 NOTE — ED Notes (Signed)
Pt having active bleeding from wound at this time.  Dr. Kathrynn Humble made aware.

## 2014-06-01 NOTE — ED Notes (Signed)
Pt talking on telephone,  No complaints voiced at this time.  Skin warm and dry, color appropriate

## 2014-06-01 NOTE — ED Notes (Signed)
Pt finished drinking contrast. CT made aware.

## 2014-06-01 NOTE — ED Notes (Signed)
No active bleeding noted at this time.  Pt to bathroom via wheelchair.

## 2014-06-01 NOTE — ED Notes (Signed)
Puncture wound being sutured at this time.  Pressure dressing applied with 6" ace wrap.

## 2014-06-01 NOTE — ED Notes (Signed)
Patient transported to CT 

## 2014-06-01 NOTE — ED Provider Notes (Signed)
CSN: [id number]     Arrival date & time 06/01/14  1655 History   None    Chief Complaint  Patient presents with  . Abdominal Injury  . Trauma     (Consider location/radiation/quality/duration/timing/severity/associated sxs/prior Treatment) HPI  [name] is a 80 y.o. male  with past medical history of hypertension, hyperlipidemia, and atrial fibrillation, presenting with abdominal injury. Patient states that he was using any circular saw earlier today cutting pieces of wood trim, when the saw "kicked" and struck him in the abdomen. Patient drove himself to the hospital and arrived near the ambulance bay. EMTs nearby quickly escorted the patient into the emergency department when I saw his injury. At this time patient is endorsing mild abdominal wall pain, he states "what's all the fuss about"when approached by the trauma team. Patient is alert and oriented, bleeding is controlled. Patient denies any pain with the exception of superficial abdominal wall pain on the right lower quadrant. Patient denies any other injuries. Patient endorses that he is on blood thinners due to his cardiac conditions.     Past Medical History  Diagnosis Date  . Hypertension   . Hyperlipidemia   . Atrial fibrillation    Past Surgical History  Procedure Laterality Date  . Joint replacement     No family history on file. History  Substance Use Topics  . Smoking status: Never Smoker   . Smokeless tobacco: Not on file  . Alcohol Use: No    Review of Systems  Constitutional: Negative.   HENT: Negative.   Eyes: Negative.   Respiratory: Negative.   Cardiovascular: Negative.   Gastrointestinal: Negative.   Endocrine: Negative.   Genitourinary: Negative.   Musculoskeletal: Negative.   Skin: Positive for wound.  Allergic/Immunologic: Negative.   Neurological: Negative.   Hematological: Negative.   Psychiatric/Behavioral: Negative.       Allergies  Codeine  Home Medications   Prior to  Admission medications   Medication Sig Start Date End Date Taking? Authorizing Provider  amiodarone (PACERONE) 100 MG tablet Take 1 tablet (100 mg total) by mouth daily. 12/10/13  Yes Daniel J Angiulli, PA-C  apixaban (ELIQUIS) 2.5 MG TABS tablet Take 2.5 mg by mouth 2 (two) times daily.   Yes Historical Provider, MD  cholecalciferol (VITAMIN D) 1000 UNITS tablet Take 1,000 Units by mouth daily.   Yes Historical Provider, MD  metoprolol tartrate (LOPRESSOR) 25 MG tablet Take 1 tablet (25 mg total) by mouth 2 (two) times daily. 12/10/13  Yes Daniel J Angiulli, PA-C  metoprolol tartrate (LOPRESSOR) 25 MG tablet Take 25 mg by mouth every morning.   Yes Historical Provider, MD  Multiple Vitamins-Minerals (CENTRUM SILVER ADULT 50+) TABS Take 1 tablet by mouth daily.   Yes Historical Provider, MD  pravastatin (PRAVACHOL) 20 MG tablet Take 1 tablet (20 mg total) by mouth at bedtime. 12/10/13  Yes Daniel J Angiulli, PA-C  vitamin B-12 (CYANOCOBALAMIN) 100 MCG tablet Take 100 mcg by mouth daily.    Yes Historical Provider, MD  vitamin E 400 UNIT capsule Take 800 Units by mouth daily.   Yes Historical Provider, MD   BP 127/65  Pulse 64  Temp(Src) 98.4 F (36.9 C) (Oral)  Resp 18  SpO2 99% Physical Exam  Nursing note and vitals reviewed. Constitutional: He is oriented to person, place, and time. He appears well-developed and well-nourished. No distress.  HENT:  Head: Normocephalic and atraumatic.  Right Ear: External ear normal.  Left Ear: External ear normal.  Nose:  Nose normal.  Mouth/Throat: Oropharynx is clear and moist. No oropharyngeal exudate.  Eyes: Conjunctivae and EOM are normal. Pupils are equal, round, and reactive to light. Right eye exhibits no discharge. Left eye exhibits no discharge. No scleral icterus.  Neck: Normal range of motion. Neck supple. No JVD present. No tracheal deviation present. No thyromegaly present.  Cardiovascular: Normal rate, normal heart sounds and intact distal  pulses.  An irregularly irregular rhythm present. Exam reveals no gallop and no friction rub.   No murmur heard. Pulmonary/Chest: Effort normal and breath sounds normal. No stridor. No respiratory distress. He has no wheezes. He has no rales. He exhibits no tenderness.  Abdominal: Soft. Bowel sounds are normal. He exhibits no distension. There is tenderness. There is no rigidity, no rebound, no guarding, no tenderness at McBurney's point and negative Murphy's sign.    3 cm lac with associated abrasion 7 cm long.  Musculoskeletal: Normal range of motion. He exhibits no edema and no tenderness.  Lymphadenopathy:    He has no cervical adenopathy.  Neurological: He is alert and oriented to person, place, and time. He has normal reflexes. He displays normal reflexes. No cranial nerve deficit. He exhibits normal muscle tone. Coordination normal.  Skin: Skin is warm and dry. No rash noted. He is not diaphoretic. No erythema. No pallor.  Psychiatric: He has a normal mood and affect. His speech is normal and behavior is normal. Judgment and thought content normal. Cognition and memory are normal.    ED Course  LACERATION REPAIR Date/Time: 06/01/2014 11:48 PM Performed by: Hoyle Sauer Authorized by: Varney Biles Consent: Verbal consent obtained. Risks and benefits: risks, benefits and alternatives were discussed Consent given by: patient Required items: required blood products, implants, devices, and special equipment available Patient identity confirmed: verbally with patient Body area: trunk Location details: abdomen Laceration length: 3 cm Foreign bodies: unknown Tendon involvement: none Nerve involvement: none Vascular damage: no Anesthesia: local infiltration Local anesthetic: lidocaine 2% with epinephrine Anesthetic total: 6 ml Preparation: Patient was prepped and draped in the usual sterile fashion. Irrigation solution: saline Irrigation method: jet lavage Amount of cleaning:  standard Debridement: minimal Degree of undermining: none Skin closure: 3-0 nylon Number of sutures: 3 Technique: complex (Figure 8 sutures) Approximation: close Approximation difficulty: simple Dressing: pressure dressing (Surgicel) Patient tolerance: Patient tolerated the procedure well with no immediate complications.   (including critical care time) Labs Review Labs Reviewed  CBC WITH DIFFERENTIAL - Abnormal; Notable for the following:    RBC 3.72 (*)    Hemoglobin 12.1 (*)    HCT 36.7 (*)    All other components within normal limits  COMPREHENSIVE METABOLIC PANEL - Abnormal; Notable for the following:    BUN 29 (*)    Creatinine, Ser 1.63 (*)    GFR calc non Af Amer 35 (*)    GFR calc Af Amer 41 (*)    All other components within normal limits  I-STAT CHEM 8, ED - Abnormal; Notable for the following:    BUN 31 (*)    Creatinine, Ser 1.50 (*)    Hemoglobin 11.9 (*)    HCT 35.0 (*)    All other components within normal limits    Imaging Review Ct Abdomen Pelvis Wo Contrast  06/01/2014   CLINICAL DATA:  Abdominal injury, trauma. Puncture wound right lower quadrant.  EXAM: CT ABDOMEN AND PELVIS WITHOUT CONTRAST  TECHNIQUE: Multidetector CT imaging of the abdomen and pelvis was performed following the standard protocol without IV  contrast.  COMPARISON:  CT abdomen 01/22/2010  FINDINGS: Lung bases are clear. Moderately large hiatal hernia. Coronary artery calcification  Small hepatic cysts in the dome of the liver. Gallbladder is negative. Pancreas is atrophied without mass or edema.  Large left upper pole renal cyst measuring 7 cm. No renal obstruction or stone. Atherosclerotic aorta without aneurysm.  Sigmoid diverticulosis, extensive.  No bowel edema or obstruction.  Soft tissue contusion in the right mid abdomen with overlying bandages. No evidence of penetration into the abdominal cavity. No free fluid or gas is present in the abdomen.  IMPRESSION: Soft tissue injury in the  right abdomen. No evidence of intra abdominal penetration.  Sigmoid diverticulosis.  Moderate hiatal hernia.   Electronically Signed   By: Franchot Gallo M.D.   On: 06/01/2014 23:35     EKG Interpretation None      MDM   Final diagnoses:  Laceration of abdominal wall, initial encounter    On arrival to emergency department patient does not appear to be in any acute distress. However, it is still possible the patient has a serious injury into his peritoneal cavity. Although probing of the wound seems limited to superficial tissues, will need to obtain imaging studies to rule out violation of the peritoneum. Will obtain a CT of the abdomen and pelvis and will obtain basic labs. Laboratory workup reviewed as above, no other acute concerns.   Patient will need to have a noncontrasted scan due to his creatinine elevation. He has also received a bolus of fluids while in the emergency department. Abdominal CT did not show any apparent injury that penetrates the abdominal wall, into the peritoneum.  Pt laceration required figure of eight stitches and surgicel over wound in order to achieve hemostasis.  Advised to f/u in ED in 2 days for a recheck.  Pt appropriate for d/c home.    Patient given return precautions for laceration.  Advised to return for worsening symptoms including chest pain, shortness of breath, severe headache, intractable nausea or vomiting, fever, or chills, inability to take medications, or other acute concerns.  Advised to follow up with ED provider in 2 days.  Patient in agreement with and expressed understanding of follow plan, plan of care, and return precautions.  All questions answered prior to discharge.  Patient was discharged in stable condition, ambulating without difficulty.  Patient care was discussed with my attending, Dr. Kathrynn Humble.   Hoyle Sauer, MD 06/03/14 [phone]

## 2014-06-01 NOTE — ED Notes (Signed)
Waiting on creatinine results for scan per Dr. Kathrynn Humble

## 2014-06-01 NOTE — ED Notes (Signed)
Patient returned from CT

## 2014-06-02 NOTE — Discharge Instructions (Signed)
Follow up with Dr. Estanislado Spire in the ED from 3pm-11pm on Saturday 8/8,  Tell registration, that you have been instructed to be checked in triage.  Have them Call [phone] and you can be checked without registering.   Laceration Care, Adult A laceration is a cut or lesion that goes through all layers of the skin and into the tissue just beneath the skin. TREATMENT  Some lacerations may not require closure. Some lacerations may not be able to be closed due to an increased risk of infection. It is important to see your caregiver as soon as possible after an injury to minimize the risk of infection and maximize the opportunity for successful closure. If closure is appropriate, pain medicines may be given, if needed. The wound will be cleaned to help prevent infection. Your caregiver will use stitches (sutures), staples, wound glue (adhesive), or skin adhesive strips to repair the laceration. These tools bring the skin edges together to allow for faster healing and a better cosmetic outcome. However, all wounds will heal with a scar. Once the wound has healed, scarring can be minimized by covering the wound with sunscreen during the day for 1 full year. HOME CARE INSTRUCTIONS  For sutures or staples:  Keep the wound clean and dry.  If you were given a bandage (dressing), you should change it at least once a day. Also, change the dressing if it becomes wet or dirty, or as directed by your caregiver.  Wash the wound with soap and water 2 times a day. Rinse the wound off with water to remove all soap. Pat the wound dry with a clean towel.  After cleaning, apply a thin layer of the antibiotic ointment as recommended by your caregiver. This will help prevent infection and keep the dressing from sticking.  You may shower as usual after the first 24 hours. Do not soak the wound in water until the sutures are removed.  Only take over-the-counter or prescription medicines for pain, discomfort, or fever as directed  by your caregiver.  Get your sutures or staples removed as directed by your caregiver. For skin adhesive strips:  Keep the wound clean and dry.  Do not get the skin adhesive strips wet. You may bathe carefully, using caution to keep the wound dry.  If the wound gets wet, pat it dry with a clean towel.  Skin adhesive strips will fall off on their own. You may trim the strips as the wound heals. Do not remove skin adhesive strips that are still stuck to the wound. They will fall off in time. For wound adhesive:  You may briefly wet your wound in the shower or bath. Do not soak or scrub the wound. Do not swim. Avoid periods of heavy perspiration until the skin adhesive has fallen off on its own. After showering or bathing, gently pat the wound dry with a clean towel.  Do not apply liquid medicine, cream medicine, or ointment medicine to your wound while the skin adhesive is in place. This may loosen the film before your wound is healed.  If a dressing is placed over the wound, be careful not to apply tape directly over the skin adhesive. This may cause the adhesive to be pulled off before the wound is healed.  Avoid prolonged exposure to sunlight or tanning lamps while the skin adhesive is in place. Exposure to ultraviolet light in the first year will darken the scar.  The skin adhesive will usually remain in place for 5 to  10 days, then naturally fall off the skin. Do not pick at the adhesive film. You may need a tetanus shot if:  You cannot remember when you had your last tetanus shot.  You have never had a tetanus shot. If you get a tetanus shot, your arm may swell, get red, and feel warm to the touch. This is common and not a problem. If you need a tetanus shot and you choose not to have one, there is a rare chance of getting tetanus. Sickness from tetanus can be serious. SEEK MEDICAL CARE IF:   You have redness, swelling, or increasing pain in the wound.  You see a red line that  goes away from the wound.  You have yellowish-white fluid (pus) coming from the wound.  You have a fever.  You notice a bad smell coming from the wound or dressing.  Your wound breaks open before or after sutures have been removed.  You notice something coming out of the wound such as wood or glass.  Your wound is on your hand or foot and you cannot move a finger or toe. SEEK IMMEDIATE MEDICAL CARE IF:   Your pain is not controlled with prescribed medicine.  You have severe swelling around the wound causing pain and numbness or a change in color in your arm, hand, leg, or foot.  Your wound splits open and starts bleeding.  You have worsening numbness, weakness, or loss of function of any joint around or beyond the wound.  You develop painful lumps near the wound or on the skin anywhere on your body. MAKE SURE YOU:   Understand these instructions.  Will watch your condition.  Will get help right away if you are not doing well or get worse. Document Released: 10/14/2005 Document Revised: 01/06/2012 Document Reviewed: 04/09/2011 [facility] Patient Information 2015 [place], [place]. This information is not intended to replace advice given to you by your health care provider. Make sure you discuss any questions you have with your health care provider.

## 2014-06-02 NOTE — ED Notes (Signed)
Bright red blood noted on dressing.  [name], Res in to suture wound.  No complaints voiced from pt.  St's he feels fine.

## 2014-06-04 ENCOUNTER — Emergency Department (HOSPITAL_COMMUNITY)
Admission: EM | Admit: 2014-06-04 | Discharge: 2014-06-04 | Disposition: A | Discharge status: Home / Self Care | Payer: Medicare Other

## 2014-06-09 NOTE — ED Provider Notes (Addendum)
I performed a history and physical examination of  Domenic Schwab and discussed his management with resident physician. I agree with the history, physical, assessment, and plan of care, with the following exceptions: None I was present for the following procedures: LACERATION REPAIR  Time Spent in Critical Care of the patient: None  Time spent in discussions with the patient and family: 15 minutes  Wilene Pharo  Pt comes in with cc of stab injury to the abdomen. Pt essentially cut himself with electric saw. On antiplatelet agents. Pt has blood slowly oozing out, no peritoneal signs. CT non con ordered (due to cr elevation), and doesn't show free air, or deep gaping trauma. Pt's bleeding would not cease despite several pressure dressings, and so we did end up closing the wound, and i asked patient to come to the ER for a wound recheck in 2 days - and we would do a quick evaluation as a courtesy to ensure there was no infection.  Varney Biles, MD 06/09/14 [place], MD 06/15/14 [phone]

## 2014-08-30 DIAGNOSIS — M7022 Olecranon bursitis, left elbow: Secondary | ICD-10-CM | POA: Diagnosis not present

## 2014-11-02 DIAGNOSIS — Z13220 Encounter for screening for lipoid disorders: Secondary | ICD-10-CM | POA: Diagnosis not present

## 2014-11-02 DIAGNOSIS — I480 Paroxysmal atrial fibrillation: Secondary | ICD-10-CM | POA: Diagnosis not present

## 2014-11-02 DIAGNOSIS — R002 Palpitations: Secondary | ICD-10-CM | POA: Diagnosis not present

## 2014-12-01 DIAGNOSIS — I480 Paroxysmal atrial fibrillation: Secondary | ICD-10-CM | POA: Diagnosis not present

## 2014-12-01 DIAGNOSIS — Z7901 Long term (current) use of anticoagulants: Secondary | ICD-10-CM | POA: Diagnosis not present

## 2014-12-01 DIAGNOSIS — N183 Chronic kidney disease, stage 3 (moderate): Secondary | ICD-10-CM | POA: Diagnosis not present

## 2014-12-01 DIAGNOSIS — Z8673 Personal history of transient ischemic attack (TIA), and cerebral infarction without residual deficits: Secondary | ICD-10-CM | POA: Diagnosis not present

## 2015-01-10 DIAGNOSIS — M7020 Olecranon bursitis, unspecified elbow: Secondary | ICD-10-CM | POA: Diagnosis not present

## 2015-01-19 DIAGNOSIS — M7020 Olecranon bursitis, unspecified elbow: Secondary | ICD-10-CM | POA: Diagnosis not present

## 2015-02-13 ENCOUNTER — Other Ambulatory Visit: Payer: Self-pay | Admitting: Orthopedic Surgery

## 2015-02-13 DIAGNOSIS — M7989 Other specified soft tissue disorders: Secondary | ICD-10-CM | POA: Diagnosis not present

## 2015-02-13 DIAGNOSIS — M7020 Olecranon bursitis, unspecified elbow: Secondary | ICD-10-CM | POA: Diagnosis not present

## 2015-02-13 DIAGNOSIS — M7022 Olecranon bursitis, left elbow: Secondary | ICD-10-CM | POA: Diagnosis not present

## 2015-03-06 DIAGNOSIS — L603 Nail dystrophy: Secondary | ICD-10-CM | POA: Diagnosis not present

## 2015-06-02 DIAGNOSIS — Z8673 Personal history of transient ischemic attack (TIA), and cerebral infarction without residual deficits: Secondary | ICD-10-CM | POA: Diagnosis not present

## 2015-06-02 DIAGNOSIS — Z7901 Long term (current) use of anticoagulants: Secondary | ICD-10-CM | POA: Diagnosis not present

## 2015-06-02 DIAGNOSIS — N183 Chronic kidney disease, stage 3 (moderate): Secondary | ICD-10-CM | POA: Diagnosis not present

## 2015-06-02 DIAGNOSIS — I480 Paroxysmal atrial fibrillation: Secondary | ICD-10-CM | POA: Diagnosis not present

## 2015-12-04 DIAGNOSIS — I480 Paroxysmal atrial fibrillation: Secondary | ICD-10-CM | POA: Diagnosis not present

## 2015-12-04 DIAGNOSIS — Z8673 Personal history of transient ischemic attack (TIA), and cerebral infarction without residual deficits: Secondary | ICD-10-CM | POA: Diagnosis not present

## 2015-12-04 DIAGNOSIS — N183 Chronic kidney disease, stage 3 (moderate): Secondary | ICD-10-CM | POA: Diagnosis not present

## 2015-12-04 DIAGNOSIS — Z7901 Long term (current) use of anticoagulants: Secondary | ICD-10-CM | POA: Diagnosis not present

## 2016-01-17 DIAGNOSIS — D485 Neoplasm of uncertain behavior of skin: Secondary | ICD-10-CM | POA: Diagnosis not present

## 2016-01-17 DIAGNOSIS — D044 Carcinoma in situ of skin of scalp and neck: Secondary | ICD-10-CM | POA: Diagnosis not present

## 2016-01-23 ENCOUNTER — Encounter (HOSPITAL_COMMUNITY): Payer: Self-pay | Admitting: *Deleted

## 2016-01-23 ENCOUNTER — Emergency Department (INDEPENDENT_AMBULATORY_CARE_PROVIDER_SITE_OTHER)
Admission: EM | Admit: 2016-01-23 | Discharge: 2016-01-23 | Disposition: A | Discharge status: Home / Self Care | Payer: Medicare Other | Source: Home / Self Care | Attending: Family Medicine | Admitting: Family Medicine

## 2016-01-23 DIAGNOSIS — S51812A Laceration without foreign body of left forearm, initial encounter: Secondary | ICD-10-CM | POA: Diagnosis not present

## 2016-01-23 NOTE — ED Provider Notes (Signed)
CSN: [id number]     Arrival date & time 01/23/16  1336 History   First MD Initiated Contact with Patient 01/23/16 1533     Chief Complaint  Patient presents with  . Extremity Laceration   (Consider location/radiation/quality/duration/timing/severity/associated sxs/prior Treatment) Patient is a 82 y.o. male presenting with skin laceration. The history is provided by the patient.  Laceration Location:  Shoulder/arm Shoulder/arm laceration location:  L forearm Length (cm):  2 Depth:  Cutaneous Quality: stellate   Quality comment:  Flap lac Bleeding: controlled   Time since incident:  1 week Pain details:    Severity:  No pain   Progression:  Unchanged Foreign body present:  No foreign bodies Tetanus status:  Up to date   Past Medical History  Diagnosis Date  . Hypertension   . Hyperlipidemia   . Atrial fibrillation [facility])    Past Surgical History  Procedure Laterality Date  . Joint replacement     History reviewed. No pertinent family history. Social History  Substance Use Topics  . Smoking status: Never Smoker   . Smokeless tobacco: None  . Alcohol Use: No    Review of Systems  Constitutional: Negative.   Skin: Positive for wound.  All other systems reviewed and are negative.   Allergies  Codeine  Home Medications   Prior to Admission medications   Medication Sig Start Date End Date Taking? Authorizing Provider  amiodarone (PACERONE) 100 MG tablet Take 1 tablet (100 mg total) by mouth daily. 12/10/13   Lavon Paganini Angiulli, PA-C  apixaban (ELIQUIS) 2.5 MG TABS tablet Take 2.5 mg by mouth 2 (two) times daily.    Historical Provider, MD  cholecalciferol (VITAMIN D) 1000 UNITS tablet Take 1,000 Units by mouth daily.    Historical Provider, MD  metoprolol tartrate (LOPRESSOR) 25 MG tablet Take 1 tablet (25 mg total) by mouth 2 (two) times daily. 12/10/13   Lavon Paganini Angiulli, PA-C  metoprolol tartrate (LOPRESSOR) 25 MG tablet Take 25 mg by mouth every morning.     Historical Provider, MD  Multiple Vitamins-Minerals (CENTRUM SILVER ADULT 50+) TABS Take 1 tablet by mouth daily.    Historical Provider, MD  pravastatin (PRAVACHOL) 20 MG tablet Take 1 tablet (20 mg total) by mouth at bedtime. 12/10/13   Lavon Paganini Angiulli, PA-C  vitamin B-12 (CYANOCOBALAMIN) 100 MCG tablet Take 100 mcg by mouth daily.     Historical Provider, MD  vitamin E 400 UNIT capsule Take 800 Units by mouth daily.    Historical Provider, MD   Meds Ordered and Administered this Visit  Medications - No data to display  BP 166/71 mmHg  Pulse 51  Temp(Src) 98 F (36.7 C) (Oral)  Resp 16  SpO2 98% No data found.   Physical Exam  Constitutional: He is oriented to person, place, and time. He appears well-developed and well-nourished.  Neurological: He is alert and oriented to person, place, and time.  Skin: Skin is warm and dry. No erythema.  Healing retracting flap lac to dorsum of left forearm, no sign of infection.  Nursing note and vitals reviewed.   ED Course  Procedures (including critical care time)  Labs Review Labs Reviewed - No data to display  Imaging Review No results found.   Visual Acuity Review  Right Eye Distance:   Left Eye Distance:   Bilateral Distance:    Right Eye Near:   Left Eye Near:    Bilateral Near:         MDM  1. Laceration of forearm, left, initial encounter        Billy Fischer, MD 01/24/16 [phone]

## 2016-01-23 NOTE — Discharge Instructions (Signed)
Wash twice a day, cover as needed. Return if any problems.

## 2016-01-23 NOTE — ED Notes (Signed)
Pt sustained  A  Superficial  Laceration  To  His  l  Arm     About  1  Week  Ago  It  Is  More  Of a  Skin tear      OPt  Is  On blood  Thinners  And  He  Reports  The  Bleeding   Continues  At  Times

## 2016-05-10 ENCOUNTER — Encounter (HOSPITAL_COMMUNITY): Payer: Self-pay | Admitting: Emergency Medicine

## 2016-05-10 ENCOUNTER — Emergency Department (HOSPITAL_COMMUNITY): Payer: Medicare Other

## 2016-05-10 ENCOUNTER — Emergency Department (HOSPITAL_COMMUNITY)
Admission: EM | Admit: 2016-05-10 | Discharge: 2016-05-10 | Disposition: A | Discharge status: Home / Self Care | Payer: Medicare Other | Attending: Emergency Medicine | Admitting: Emergency Medicine

## 2016-05-10 DIAGNOSIS — I10 Essential (primary) hypertension: Secondary | ICD-10-CM | POA: Diagnosis not present

## 2016-05-10 DIAGNOSIS — Z7901 Long term (current) use of anticoagulants: Secondary | ICD-10-CM | POA: Diagnosis not present

## 2016-05-10 DIAGNOSIS — I63411 Cerebral infarction due to embolism of right middle cerebral artery: Secondary | ICD-10-CM | POA: Diagnosis not present

## 2016-05-10 DIAGNOSIS — Z79899 Other long term (current) drug therapy: Secondary | ICD-10-CM | POA: Diagnosis not present

## 2016-05-10 DIAGNOSIS — R9082 White matter disease, unspecified: Secondary | ICD-10-CM | POA: Diagnosis not present

## 2016-05-10 DIAGNOSIS — R5383 Other fatigue: Secondary | ICD-10-CM | POA: Diagnosis present

## 2016-05-10 DIAGNOSIS — R531 Weakness: Secondary | ICD-10-CM | POA: Diagnosis not present

## 2016-05-10 DIAGNOSIS — E785 Hyperlipidemia, unspecified: Secondary | ICD-10-CM | POA: Insufficient documentation

## 2016-05-10 LAB — CBC WITH DIFFERENTIAL/PLATELET
Basophils Absolute: 0 10*3/uL (ref 0.0–0.1)
Basophils Relative: 0 %
Eosinophils Absolute: 0.1 10*3/uL (ref 0.0–0.7)
Eosinophils Relative: 1 %
HCT: 37.4 % — ABNORMAL LOW (ref 39.0–52.0)
Hemoglobin: 12 g/dL — ABNORMAL LOW (ref 13.0–17.0)
Lymphocytes Relative: 16 %
Lymphs Abs: 1.4 10*3/uL (ref 0.7–4.0)
MCH: 31.4 pg (ref 26.0–34.0)
MCHC: 32.1 g/dL (ref 30.0–36.0)
MCV: 97.9 fL (ref 78.0–100.0)
Monocytes Absolute: 0.6 10*3/uL (ref 0.1–1.0)
Monocytes Relative: 7 %
Neutro Abs: 6.6 10*3/uL (ref 1.7–7.7)
Neutrophils Relative %: 76 %
Platelets: 289 10*3/uL (ref 150–400)
RBC: 3.82 MIL/uL — ABNORMAL LOW (ref 4.22–5.81)
RDW: 12.8 % (ref 11.5–15.5)
WBC: 8.6 10*3/uL (ref 4.0–10.5)

## 2016-05-10 LAB — I-STAT TROPONIN, ED: Troponin i, poc: 0.01 ng/mL (ref 0.00–0.08)

## 2016-05-10 LAB — COMPREHENSIVE METABOLIC PANEL
ALT: 12 U/L — ABNORMAL LOW (ref 17–63)
AST: 21 U/L (ref 15–41)
Albumin: 3.3 g/dL — ABNORMAL LOW (ref 3.5–5.0)
Alkaline Phosphatase: 52 U/L (ref 38–126)
Anion gap: 7 (ref 5–15)
BUN: 28 mg/dL — ABNORMAL HIGH (ref 6–20)
CO2: 25 mmol/L (ref 22–32)
Calcium: 9 mg/dL (ref 8.9–10.3)
Chloride: 107 mmol/L (ref 101–111)
Creatinine, Ser: 1.66 mg/dL — ABNORMAL HIGH (ref 0.61–1.24)
GFR calc Af Amer: 40 mL/min — ABNORMAL LOW (ref >60)
GFR calc non Af Amer: 34 mL/min — ABNORMAL LOW (ref >60)
Glucose, Bld: 114 mg/dL — ABNORMAL HIGH (ref 65–99)
Potassium: 4.4 mmol/L (ref 3.5–5.1)
Sodium: 139 mmol/L (ref 135–145)
Total Bilirubin: 0.5 mg/dL (ref 0.3–1.2)
Total Protein: 6.9 g/dL (ref 6.5–8.1)

## 2016-05-10 NOTE — Discharge Instructions (Signed)

## 2016-05-10 NOTE — ED Notes (Signed)
Pt transporting to CT  

## 2016-05-10 NOTE — ED Provider Notes (Signed)
CSN: [id number]     Arrival date & time 05/10/16  1015 History   First MD Initiated Contact with Patient 05/10/16 1053     Chief Complaint  Patient presents with  . Fatigue     HPI  Expand All Collapse All   Pt arrives via POv from home with generalized weakness that began this morning. Pt states he ate breakfast in hopes of feeling better but no improvement so came to the hospital. Pt states he's been feeling bad "for sometime now". Pt awake, alert, oriented x4, no droop, no drift, no slurred speech. EKG done in triage.        Past Medical History  Diagnosis Date  . Hypertension   . Hyperlipidemia   . Atrial fibrillation [facility])    Past Surgical History  Procedure Laterality Date  . Joint replacement     History reviewed. No pertinent family history. Social History  Substance Use Topics  . Smoking status: Never Smoker   . Smokeless tobacco: None  . Alcohol Use: No    Review of Systems  All other systems reviewed and are negative  Allergies  Codeine  Home Medications   Prior to Admission medications   Medication Sig Start Date End Date Taking? Authorizing Provider  amiodarone (PACERONE) 100 MG tablet Take 1 tablet (100 mg total) by mouth daily. 12/10/13   Lavon Paganini Angiulli, PA-C  apixaban (ELIQUIS) 2.5 MG TABS tablet Take 2.5 mg by mouth 2 (two) times daily.    Historical Provider, MD  cholecalciferol (VITAMIN D) 1000 UNITS tablet Take 1,000 Units by mouth daily.    Historical Provider, MD  metoprolol tartrate (LOPRESSOR) 25 MG tablet Take 1 tablet (25 mg total) by mouth 2 (two) times daily. 12/10/13   Lavon Paganini Angiulli, PA-C  metoprolol tartrate (LOPRESSOR) 25 MG tablet Take 25 mg by mouth every morning.    Historical Provider, MD  Multiple Vitamins-Minerals (CENTRUM SILVER ADULT 50+) TABS Take 1 tablet by mouth daily.    Historical Provider, MD  pravastatin (PRAVACHOL) 20 MG tablet Take 1 tablet (20 mg total) by mouth at bedtime. 12/10/13   Lavon Paganini Angiulli, PA-C   vitamin B-12 (CYANOCOBALAMIN) 100 MCG tablet Take 100 mcg by mouth daily.     Historical Provider, MD  vitamin E 400 UNIT capsule Take 800 Units by mouth daily.    Historical Provider, MD   BP 146/61 mmHg  Pulse 44  Temp(Src) 98 F (36.7 C) (Oral)  Resp 16  Ht 5' 7.5" (1.715 m)  Wt 141 lb (63.957 kg)  BMI 21.75 kg/m2  SpO2 98% Physical Exam Physical Exam  Nursing note and vitals reviewed. Constitutional: He is oriented to person, place, and time. He appears well-developed and well-nourished. No distress.  HENT:  Head: Normocephalic and atraumatic.  Eyes: Pupils are equal, round, and reactive to light.  Neck: Normal range of motion.  Cardiovascular: Normal rate and intact distal pulses.   Pulmonary/Chest: No respiratory distress.  Abdominal: Normal appearance. He exhibits no distension.  Musculoskeletal: Normal range of motion.  Neurological: He is alert and oriented to person, place, and time. No cranial nerve deficit.  Skin: Skin is warm and dry. No rash noted.  Psychiatric: He has a normal mood and affect. His behavior is normal.   ED Course  Procedures (including critical care time) Labs Review Labs Reviewed  CBC WITH DIFFERENTIAL/PLATELET - Abnormal; Notable for the following:    RBC 3.82 (*)    Hemoglobin 12.0 (*)    HCT  37.4 (*)    All other components within normal limits  COMPREHENSIVE METABOLIC PANEL - Abnormal; Notable for the following:    Glucose, Bld 114 (*)    BUN 28 (*)    Creatinine, Ser 1.66 (*)    Albumin 3.3 (*)    ALT 12 (*)    GFR calc non Af Amer 34 (*)    GFR calc Af Amer 40 (*)    All other components within normal limits  I-STAT TROPOININ, ED    Imaging Review Ct Head Wo Contrast  05/10/2016  CLINICAL DATA:  Sudden onset of weakness this morning EXAM: CT HEAD WITHOUT CONTRAST TECHNIQUE: Contiguous axial images were obtained from the base of the skull through the vertex without intravenous contrast. COMPARISON:  11/30/2013 FINDINGS: No  skull fracture is noted. Paranasal sinuses and mastoid air cells are unremarkable. Atherosclerotic calcifications of carotid siphon. Atherosclerotic calcifications are noted left vertebral artery. No intracranial hemorrhage, mass effect or midline shift. Stable cerebral atrophy and chronic white matter disease. Again noted old infarct in right MCA territory. No definite acute cortical infarction. No mass lesion is noted on this unenhanced scan. IMPRESSION: No acute intracranial abnormality. Stable atrophy and chronic white matter disease. Stable old infarct in right MCA territory. No definite acute cortical infarction. Electronically Signed   By: Lahoma Crocker M.D.   On: 05/10/2016 11:37   I have personally reviewed and evaluated these images and lab results as part of my medical decision-making.   EKG Interpretation   Date/Time:  Friday May 10 2016 10:22:55 EDT Ventricular Rate:  59 PR Interval:  178 QRS Duration: 96 QT Interval:  432 QTC Calculation: 427 R Axis:   74 Text Interpretation:  Sinus bradycardia with Premature atrial complexes  Anterior infarct , age undetermined Abnormal ECG Confirmed by Shaletha Humble  MD,  [facility] ([id number]) on 05/10/2016 11:10:21 AM     After treatment in the ED the patient feels back to baseline and wants to go home. MDM   Final diagnoses:  General weakness        Leonard Schwartz, MD 05/10/16 1427

## 2016-05-10 NOTE — ED Notes (Signed)
Pt returned to room from CT

## 2016-05-10 NOTE — ED Notes (Signed)
Pt placed back on cardiac monitor after return from CT

## 2016-05-10 NOTE — ED Notes (Signed)
Pt arrives via POv from home with generalized weakness that began this morning. Pt states he ate breakfast in hopes of feeling better but no improvement so came to the hospital. Pt states he's been feeling bad "for sometime now". Pt awake, alert, oriented x4, no droop, no drift, no slurred speech. EKG done in triage.

## 2016-05-10 NOTE — ED Notes (Signed)
Patient ambulated to restroom independently with steady gait.

## 2016-06-24 ENCOUNTER — Ambulatory Visit
Admission: RE | Admit: 2016-06-24 | Discharge: 2016-06-24 | Disposition: A | Discharge status: Home / Self Care | Payer: Medicare Other | Source: Ambulatory Visit | Attending: Cardiology | Admitting: Cardiology

## 2016-06-24 ENCOUNTER — Other Ambulatory Visit: Payer: Self-pay | Admitting: Cardiology

## 2016-06-24 DIAGNOSIS — I480 Paroxysmal atrial fibrillation: Secondary | ICD-10-CM | POA: Diagnosis not present

## 2016-06-24 DIAGNOSIS — N183 Chronic kidney disease, stage 3 (moderate): Secondary | ICD-10-CM | POA: Diagnosis not present

## 2016-06-24 DIAGNOSIS — Z7901 Long term (current) use of anticoagulants: Secondary | ICD-10-CM | POA: Diagnosis not present

## 2016-06-24 DIAGNOSIS — Z8673 Personal history of transient ischemic attack (TIA), and cerebral infarction without residual deficits: Secondary | ICD-10-CM | POA: Diagnosis not present

## 2016-06-24 DIAGNOSIS — Z5181 Encounter for therapeutic drug level monitoring: Secondary | ICD-10-CM

## 2016-06-24 DIAGNOSIS — Z79899 Other long term (current) drug therapy: Secondary | ICD-10-CM | POA: Diagnosis not present

## 2016-09-18 DIAGNOSIS — Z5181 Encounter for therapeutic drug level monitoring: Secondary | ICD-10-CM | POA: Diagnosis not present

## 2016-09-18 DIAGNOSIS — I480 Paroxysmal atrial fibrillation: Secondary | ICD-10-CM | POA: Diagnosis not present

## 2016-09-25 DIAGNOSIS — I480 Paroxysmal atrial fibrillation: Secondary | ICD-10-CM | POA: Diagnosis not present

## 2016-09-25 DIAGNOSIS — N529 Male erectile dysfunction, unspecified: Secondary | ICD-10-CM | POA: Diagnosis not present

## 2016-09-25 DIAGNOSIS — I10 Essential (primary) hypertension: Secondary | ICD-10-CM | POA: Diagnosis not present

## 2016-09-25 DIAGNOSIS — Z8673 Personal history of transient ischemic attack (TIA), and cerebral infarction without residual deficits: Secondary | ICD-10-CM | POA: Diagnosis not present

## 2016-10-13 ENCOUNTER — Emergency Department (HOSPITAL_COMMUNITY): Payer: Medicare Other

## 2016-10-13 ENCOUNTER — Emergency Department (HOSPITAL_COMMUNITY)
Admission: EM | Admit: 2016-10-13 | Discharge: 2016-10-13 | Disposition: A | Discharge status: Home / Self Care | Payer: Medicare Other | Attending: Emergency Medicine | Admitting: Emergency Medicine

## 2016-10-13 DIAGNOSIS — I10 Essential (primary) hypertension: Secondary | ICD-10-CM | POA: Insufficient documentation

## 2016-10-13 DIAGNOSIS — Z9660 Presence of unspecified orthopedic joint implant: Secondary | ICD-10-CM | POA: Insufficient documentation

## 2016-10-13 DIAGNOSIS — R4182 Altered mental status, unspecified: Secondary | ICD-10-CM | POA: Diagnosis present

## 2016-10-13 DIAGNOSIS — Z8673 Personal history of transient ischemic attack (TIA), and cerebral infarction without residual deficits: Secondary | ICD-10-CM | POA: Diagnosis not present

## 2016-10-13 DIAGNOSIS — I639 Cerebral infarction, unspecified: Secondary | ICD-10-CM

## 2016-10-13 DIAGNOSIS — R410 Disorientation, unspecified: Secondary | ICD-10-CM | POA: Insufficient documentation

## 2016-10-13 DIAGNOSIS — R402441 Other coma, without documented Glasgow coma scale score, or with partial score reported, in the field [EMT or ambulance]: Secondary | ICD-10-CM | POA: Diagnosis not present

## 2016-10-13 LAB — URINALYSIS, ROUTINE W REFLEX MICROSCOPIC
Bilirubin Urine: NEGATIVE
Glucose, UA: NEGATIVE mg/dL
Hgb urine dipstick: NEGATIVE
Ketones, ur: NEGATIVE mg/dL
Nitrite: NEGATIVE
Protein, ur: 30 mg/dL — AB
Specific Gravity, Urine: 1.019 (ref 1.005–1.030)
pH: 5 (ref 5.0–8.0)

## 2016-10-13 LAB — COMPREHENSIVE METABOLIC PANEL
ALT: 13 U/L — ABNORMAL LOW (ref 17–63)
AST: 22 U/L (ref 15–41)
Albumin: 3.5 g/dL (ref 3.5–5.0)
Alkaline Phosphatase: 51 U/L (ref 38–126)
Anion gap: 8 (ref 5–15)
BUN: 29 mg/dL — ABNORMAL HIGH (ref 6–20)
CO2: 20 mmol/L — ABNORMAL LOW (ref 22–32)
Calcium: 9.1 mg/dL (ref 8.9–10.3)
Chloride: 113 mmol/L — ABNORMAL HIGH (ref 101–111)
Creatinine, Ser: 1.49 mg/dL — ABNORMAL HIGH (ref 0.61–1.24)
GFR calc Af Amer: 45 mL/min — ABNORMAL LOW (ref >60)
GFR calc non Af Amer: 39 mL/min — ABNORMAL LOW (ref >60)
Glucose, Bld: 93 mg/dL (ref 65–99)
Potassium: 4.4 mmol/L (ref 3.5–5.1)
Sodium: 141 mmol/L (ref 135–145)
Total Bilirubin: 0.6 mg/dL (ref 0.3–1.2)
Total Protein: 6.2 g/dL — ABNORMAL LOW (ref 6.5–8.1)

## 2016-10-13 LAB — CBG MONITORING, ED: Glucose-Capillary: 87 mg/dL (ref 65–99)

## 2016-10-13 LAB — CBC
HCT: 36.2 % — ABNORMAL LOW (ref 39.0–52.0)
Hemoglobin: 11.6 g/dL — ABNORMAL LOW (ref 13.0–17.0)
MCH: 31.5 pg (ref 26.0–34.0)
MCHC: 32 g/dL (ref 30.0–36.0)
MCV: 98.4 fL (ref 78.0–100.0)
Platelets: 219 10*3/uL (ref 150–400)
RBC: 3.68 MIL/uL — ABNORMAL LOW (ref 4.22–5.81)
RDW: 13.8 % (ref 11.5–15.5)
WBC: 12.4 10*3/uL — ABNORMAL HIGH (ref 4.0–10.5)

## 2016-10-13 LAB — PROTIME-INR
INR: 1.1
Prothrombin Time: 14.2 seconds (ref 11.4–15.2)

## 2016-10-13 LAB — TROPONIN I: Troponin I: <0.03 ng/mL (ref <0.03)

## 2016-10-13 MED ORDER — GADOBENATE DIMEGLUMINE 529 MG/ML IV SOLN
13.0000 mL | Freq: Once | INTRAVENOUS | Status: AC | PRN
  Administered 2016-10-13: 13 mL via INTRAVENOUS

## 2016-10-13 NOTE — Discharge Instructions (Signed)
You MRI is normal Follow up with your PCP as needed

## 2016-10-13 NOTE — ED Triage Notes (Addendum)
Pt a normally ao x 4, 82 yo male here from home via [facility] for possible tia.  Pt was talking to daughter on phone and experienced acute onset confusion lasting 1215 to 1230.  EKG showed afib and cbg was 94.  97% ra, rr 14, 62 hr irreg, bp 104/43.  Residual deficits from previous stroke are R hand numbness.  Daughter Cheryl's  Ph [phone].

## 2016-10-13 NOTE — ED Notes (Signed)
Patient transported to MRI 

## 2016-10-13 NOTE — ED Notes (Signed)
Pt stable will call taxi for transport

## 2016-10-13 NOTE — ED Notes (Signed)
Neurologist at bedside. 

## 2016-10-13 NOTE — ED Notes (Signed)
Patient transported to CT 

## 2016-10-13 NOTE — Consult Note (Signed)
NEURO HOSPITALIST CONSULT NOTE   Requestig physician: Dr. Rogene Houston  Reason for Consult: Transient confusion  History obtained from:  Patient and Chart   HPI:                                                                                                                                          [name] is an 82 y.o. male who presented with possible TIA. Symptoms began while talking to daughter on the phone. Had acute confusion from 1215 to 1230 on Sunday. Per notes "he suddenly became disoriented and confused, not knowing where he is and what he is doing he also states that he was unable to open up the sugar jar during that time and felt generalized weakness. It lasted for about 10-15 minutes and resolved on its own."  He has a prior history of stroke with residual right hand numbness. Also with history of paroxysmal atrial fibrillation, HLD and HTN. Takes Eliquis and pravachol.    Past Medical History:  Diagnosis Date  . Atrial fibrillation ([place])   . Hyperlipidemia   . Hypertension     Past Surgical History:  Procedure Laterality Date  . JOINT REPLACEMENT      No family history on file.  Social History:  reports that he has never smoked. He does not have any smokeless tobacco history on file. He reports that he does not drink alcohol or use drugs.  Allergies  Allergen Reactions  . Codeine Nausea And Vomiting    MEDICATIONS:                                                                                                                     No current facility-administered medications on file prior to encounter.    Current Outpatient Prescriptions on File Prior to Encounter  Medication Sig Dispense Refill  . amiodarone (PACERONE) 100 MG tablet Take 1 tablet (100 mg total) by mouth daily. 30 tablet 1  . apixaban (ELIQUIS) 2.5 MG TABS tablet Take 2.5 mg by mouth daily.     . cholecalciferol (VITAMIN D) 1000 UNITS tablet Take 1,000 Units by mouth  daily.    . metoprolol tartrate (LOPRESSOR) 25 MG tablet Take 1 tablet (25 mg total) by mouth 2 (two) times daily. (Patient taking  differently: Take 25 mg by mouth daily. ) 60 tablet 1  . pravastatin (PRAVACHOL) 20 MG tablet Take 1 tablet (20 mg total) by mouth at bedtime. 30 tablet 1  . vitamin B-12 (CYANOCOBALAMIN) 100 MCG tablet Take 100 mcg by mouth daily.     . vitamin E 400 UNIT capsule Take 800 Units by mouth daily.      ROS:                                                                                                                                       History obtained from patient. No vision changes, headache, SOB, or chest pain.  Blood pressure 108/67, pulse 67, resp. rate 22, SpO2 96 %.  General Examination:                                                                                                      HEENT-  Normocephalic/atraumatic.   Lungs- Respirations unlabored.  Extremities- Warm and well-perfused.  Neurological Examination Mental Status: Intact to all questions and commands.  Cranial Nerves: II: Visual fields grossly normal, PERRL III,IV, VI: ptosis not present, extra-ocular motions intact bilaterally with saccadic pursuits noted V,VII: smile symmetric, facial temperature sensation normal bilaterally VIII: HOH IX,X: No hypophonia or hoarseness XI: Asymmetric due to old shoulder injuries bilaterally XII: midline tongue extension Motor: Decreased ROM at shoulders bilaterally, with deltoid weakness (chronic) Biceps, triceps and grip 4+/5 bilaterally.  Hip flexion and knee extension 4+/5 bilaterally Sensory: Decreased temp medial lower right leg. Otherwise unremarkable.  Deep Tendon Reflexes: Hypoactive x 4.  Cerebellar: No ataxia with FNF bilaterally.  Gait: Deferred   Lab Results: Basic Metabolic Panel:  Recent Labs Lab 10/13/16 1422  NA 141  K 4.4  CL 113*  CO2 20*  GLUCOSE 93  BUN 29*  CREATININE 1.49*  CALCIUM 9.1    Liver Function  Tests:  Recent Labs Lab 10/13/16 1422  AST 22  ALT 13*  ALKPHOS 51  BILITOT 0.6  PROT 6.2*  ALBUMIN 3.5   No results for input(s): LIPASE, AMYLASE in the last 168 hours. No results for input(s): AMMONIA in the last 168 hours.  CBC:  Recent Labs Lab 10/13/16 1422  WBC 12.4*  HGB 11.6*  HCT 36.2*  MCV 98.4  PLT 219    Cardiac Enzymes:  Recent Labs Lab 10/13/16 1445  TROPONINI <0.03    Lipid Panel: No results for input(s): CHOL, TRIG, HDL, CHOLHDL, VLDL, LDLCALC in the last 168 hours.  CBG:  Recent Labs Lab 10/13/16 1408  GLUCAP 87    Microbiology: Results for orders placed or performed during the hospital encounter of 11/29/13  Urine culture     Status: None   Collection Time: 11/29/13  4:19 PM  Result Value Ref Range Status   Specimen Description URINE, CLEAN CATCH  Final   Special Requests NONE  Final   Culture  Setup Time   Final    11/30/2013 01:13 Performed at [place]   Final    40,000 COLONIES/ML Performed at [facility]   Culture   Final    Multiple bacterial morphotypes present, none predominant. Suggest appropriate recollection if clinically indicated. Performed at [facility]   Report Status 11/30/2013 FINAL  Final  MRSA PCR Screening     Status: None   Collection Time: 11/29/13  8:14 PM  Result Value Ref Range Status   MRSA by PCR NEGATIVE NEGATIVE Final    Comment:        The GeneXpert MRSA Assay (FDA approved for NASAL specimens only), is one component of a comprehensive MRSA colonization surveillance program. It is not intended to diagnose MRSA infection nor to guide or monitor treatment for MRSA infections.    Coagulation Studies:  Recent Labs  10/13/16 1445  LABPROT 14.2  INR 1.10    Imaging: Dg Chest 2 View  Result Date: 10/13/2016 CLINICAL DATA:  Confusion EXAM: CHEST  2 VIEW COMPARISON:  06/24/2016 FINDINGS: Cardiac shadow is stable. Large hiatal hernia is again  seen. Stable soft tissue density in the right peritracheal region over several previous exams. Lungs are well aerated bilaterally without focal infiltrate or sizable effusion. No bony abnormality is seen. IMPRESSION: No acute abnormality noted. Electronically Signed   By: Inez Catalina M.D.   On: 10/13/2016 17:48   Ct Head Wo Contrast  Result Date: 10/13/2016 CLINICAL DATA:  Acute onset of confusion EXAM: CT HEAD WITHOUT CONTRAST TECHNIQUE: Contiguous axial images were obtained from the base of the skull through the vertex without intravenous contrast. COMPARISON:  05/10/2016 FINDINGS: Brain: Changes consistent with chronic right MCA infarct are noted. Mild atrophic changes and chronic white matter ischemic changes are again seen. No findings to suggest acute hemorrhage, acute infarction or space-occupying mass lesion are noted. Vascular: No hyperdense vessel or unexpected calcification. Skull: Normal. Negative for fracture or focal lesion. Sinuses/Orbits: No acute finding. Other: None. IMPRESSION: Chronic changes as described above without acute abnormality. No significant interval change from the prior exam is noted. Electronically Signed   By: Inez Catalina M.D.   On: 10/13/2016 17:23   Assessment: 1. Transient confusion. Exam non-lateralizing. DDx includes small stroke, cognitive fluctuation in possible incipient dementia, volume depletion and infection.  2. Atrial fibrillation on Eliquis.  3. MRI brain revealed old large RIGHT MCA territory infarct with less than expected encephalomalacia. Moderate chronic small vessel ischemic disease also noted.  4. MRA HEAD: Negative. 5. MRA NECK:  Negative.  Recommendations: 1. Recommend follow-up MRI of the brain with contrast versus 3 to six-month follow-up MRI of the brain to exclude underlying infiltrative mass, per radiology official interpretation.  2. Outpatient echocardiogram can be obtained. However, unlikely to change management regarding stroke  prophylaxis given that he is already taking Eliquis.  3. BP management.  4. Outpatient follow up with his PCP.   Electronically signed: Dr. Kerney Elbe  10/13/2016, 8:41 PM

## 2016-10-13 NOTE — ED Provider Notes (Signed)
Emergency Department Provider Note   I have reviewed the triage vital signs and the nursing notes.   HISTORY  Chief Complaint Altered Mental Status (Possible tia)   HPI [name] is a 82 y.o. male with past medical history significant for paroxysmal A. fib, hyperlipidemia and CVA brought to ED via EMS after having transient attack of being confused. Patient states that he was having his breakfast this morning, when he suddenly became disoriented and confused, not knowing where he is and what he is doing he also states that he was unable to open up the sugar jar during that time and felt generalized weakness. It lasted for about 10-15 minutes and resolved on its own. Her daughter called her at that time and advise him to call 911. He denies any headache, change in vision, chest pain, shortness of breath or palpitations.  Patient lives alone and his home, and pretty independent for his ADLs.   Past Medical History:  Diagnosis Date  . Atrial fibrillation ([place])   . Hyperlipidemia   . Hypertension     Patient Active Problem List   Diagnosis Date Noted  . Atrial fibrillation with rapid ventricular response ([place]) 11/29/2013  . CVA (cerebral infarction) 11/29/2013  . OTHER VITAMIN B12 DEFICIENCY ANEMIA 01/08/2008  . ANAL FISTULA 01/08/2008    Past Surgical History:  Procedure Laterality Date  . JOINT REPLACEMENT      Current Outpatient Rx  . Order #: [id number] Class: Print  . Order #: [id number] Class: Historical Med  . Order #: [id number] Class: Historical Med  . Order #: [id number] Class: Print  . Order #: [id number] Class: Historical Med  . Order #: [id number] Class: Historical Med  . Order #: [id number] Class: Print  . Order #: [id number] Class: Historical Med  . Order #: [id number] Class: Historical Med    Allergies Codeine  No family history on file.  Social History Social History  Substance Use Topics  . Smoking status: Never Smoker  . Smokeless tobacco: Not on  file  . Alcohol use No    Review of Systems Constitutional: No fever/chills Eyes: No visual changes. ENT: No sore throat. Cardiovascular: Denies chest pain. Respiratory: Denies shortness of breath. Gastrointestinal: No abdominal pain.  No nausea, no vomiting.  No diarrhea.  No constipation. Genitourinary: Negative for dysuria. Musculoskeletal: Negative for back pain. Skin: Negative for rash. Neurological: Negative for headaches, focal weakness or numbness.Transient confusion. 10-point ROS otherwise negative.  ____________________________________________   PHYSICAL EXAM:  VITAL SIGNS: ED Triage Vitals  Enc Vitals Group     BP      Pulse      Resp      Temp      Temp src      SpO2      Weight      Height      Head Circumference      Peak Flow      Pain Score      Pain Loc      Pain Edu?      Excl. in [place]?    Constitutional: Alert and oriented. Well appearing and in no acute distress. Eyes: Conjunctivae are normal. PERRL. EOMI. Head: Atraumatic.Multiple small brown scabs on his skull and lateral side of neck. Nose: No congestion/rhinnorhea. Mouth/Throat: Mucous membranes are moist.  Oropharynx non-erythematous. Neck: No stridor.  No meningeal signs.  Cardiovascular: Irregular rhythm with normal rate. Heart murmur.  Respiratory: Normal respiratory effort.  No retractions. Lungs CTAB. Gastrointestinal: Soft and nontender. No distention.  Musculoskeletal: No lower  extremity tenderness nor edema. No gross deformities of extremities. Neurologic: Alert and oriented 4 , cranial nerve intact, Normal speech and language. No gross focal neurologic deficits are appreciated.  Skin:  Skin is warm, dry and intact. Multiple dry scaly skin patches bilaterally in upper extremities along with few ecchymosis. Psychiatric: Mood and affect are normal. Speech and behavior are normal.  ____________________________________________   LABS (all labs ordered are listed, but only abnormal  results are displayed)  Labs Reviewed - No data to display ____________________________________________  EKG   ____________________________________________  RADIOLOGY  No results found.  ____________________________________________   PROCEDURES  Procedure(s) performed:   Procedures   ____________________________________________   INITIAL IMPRESSION / ASSESSMENT AND PLAN / ED COURSE  Pertinent labs & imaging results that were available during my care of the patient were reviewed by me and considered in my medical decision making (see chart for details).  [name] is a 82 y.o. male with past medical history significant for paroxysmal A. fib, hyperlipidemia and CVA brought to ED via EMS after having transient attack of being confused. Patient states that he was having his breakfast this morning, when he suddenly became disoriented and confused, not knowing where he is and what he is doing he also states that he was unable to open up the sugar jar during that time and felt generalized weakness. It lasted for about 10-15 minutes and resolved on its own.  These are consistent with TIA .he had no deficit at the time of exam.we will work him up for TIA. ____________________________________________  FINAL CLINICAL IMPRESSION(S) / ED DIAGNOSES  Final diagnoses:  None     MEDICATIONS GIVEN DURING THIS VISIT:  Medications - No data to display   NEW OUTPATIENT MEDICATIONS STARTED DURING THIS VISIT:  New Prescriptions   No medications on file      Note:  This document was prepared using Dragon voice recognition software and may include unintentional dictation errors.   Emergency Medicine   Lorella Nimrod, MD 10/13/16 [place], MD 10/14/16 2055

## 2016-10-13 NOTE — ED Provider Notes (Signed)
She has been evaluated by multiple ED providers as well as neuro hospitalist MRI, MRA are within normal parameters.  Patient will be discharged home with family to follow-up with his primary care physician   Junius Creamer, NP 10/13/16 2247    Fredia Sorrow, MD 10/13/16 2329

## 2016-10-13 NOTE — ED Provider Notes (Signed)
82 year old male signed out to me at shift change pending CT and chest x-ray. Please see previous provider's note for full H&P, patient had acute onset of confusion and difficulty with motor functions around 9 AM this morning. He was talking to his daughter on this on the phone at this time. Pertinent provider spoke with daughter reporting he was confused, and instructed him to call 911.  Upon my reassessment the patient is asymptomatic. Patient is currently on L course, I consult at neurology who recommended MRI brain, MRA head and neck. He will evaluate the patient in the ED, if no significant findings on scan are noted patient would likely a candidate for discharge home.   Patient care will be signed out to oncoming provider pending imaging results and disposition   Okey Regal, PA-C 10/13/16 2019    Bryan Johns, MD 10/14/16 [id number]    Bryan Johns, MD 10/14/16 2056

## 2017-04-09 ENCOUNTER — Encounter (HOSPITAL_COMMUNITY): Payer: Self-pay | Admitting: *Deleted

## 2017-04-09 ENCOUNTER — Emergency Department (HOSPITAL_COMMUNITY)
Admission: EM | Admit: 2017-04-09 | Discharge: 2017-04-10 | Disposition: A | Discharge status: Home / Self Care | Payer: Medicare Other | Attending: Emergency Medicine | Admitting: Emergency Medicine

## 2017-04-09 DIAGNOSIS — Z7901 Long term (current) use of anticoagulants: Secondary | ICD-10-CM | POA: Insufficient documentation

## 2017-04-09 DIAGNOSIS — Z7902 Long term (current) use of antithrombotics/antiplatelets: Secondary | ICD-10-CM | POA: Diagnosis not present

## 2017-04-09 DIAGNOSIS — R55 Syncope and collapse: Secondary | ICD-10-CM | POA: Insufficient documentation

## 2017-04-09 DIAGNOSIS — I10 Essential (primary) hypertension: Secondary | ICD-10-CM | POA: Insufficient documentation

## 2017-04-09 LAB — URINALYSIS, ROUTINE W REFLEX MICROSCOPIC
Bilirubin Urine: NEGATIVE
Glucose, UA: NEGATIVE mg/dL
Hgb urine dipstick: NEGATIVE
Ketones, ur: NEGATIVE mg/dL
Nitrite: NEGATIVE
Protein, ur: NEGATIVE mg/dL
Specific Gravity, Urine: 1.024 (ref 1.005–1.030)
pH: 5 (ref 5.0–8.0)

## 2017-04-09 LAB — CBC
HCT: 38.9 % — ABNORMAL LOW (ref 39.0–52.0)
Hemoglobin: 12.3 g/dL — ABNORMAL LOW (ref 13.0–17.0)
MCH: 31.1 pg (ref 26.0–34.0)
MCHC: 31.6 g/dL (ref 30.0–36.0)
MCV: 98.5 fL (ref 78.0–100.0)
Platelets: 246 10*3/uL (ref 150–400)
RBC: 3.95 MIL/uL — ABNORMAL LOW (ref 4.22–5.81)
RDW: 13.3 % (ref 11.5–15.5)
WBC: 7.6 10*3/uL (ref 4.0–10.5)

## 2017-04-09 LAB — CBG MONITORING, ED
Glucose-Capillary: 125 mg/dL — ABNORMAL HIGH (ref 65–99)
Glucose-Capillary: 96 mg/dL (ref 65–99)

## 2017-04-09 LAB — BASIC METABOLIC PANEL
Anion gap: 8 (ref 5–15)
BUN: 31 mg/dL — ABNORMAL HIGH (ref 6–20)
CO2: 22 mmol/L (ref 22–32)
Calcium: 9.1 mg/dL (ref 8.9–10.3)
Chloride: 108 mmol/L (ref 101–111)
Creatinine, Ser: 1.77 mg/dL — ABNORMAL HIGH (ref 0.61–1.24)
GFR calc Af Amer: 36 mL/min — ABNORMAL LOW (ref >60)
GFR calc non Af Amer: 31 mL/min — ABNORMAL LOW (ref >60)
Glucose, Bld: 122 mg/dL — ABNORMAL HIGH (ref 65–99)
Potassium: 5 mmol/L (ref 3.5–5.1)
Sodium: 138 mmol/L (ref 135–145)

## 2017-04-09 NOTE — ED Notes (Signed)
MD provided patient with [place] sandwich. Patient insists he is ready to go. Awaiting discharge and papers.

## 2017-04-09 NOTE — ED Notes (Signed)
Patient assisted with ambulation in hall. He was unsteady with turning around but after a step or two, gait became steady and he ambulated independently. Denied dizziness/lightheadedness.

## 2017-04-09 NOTE — ED Notes (Signed)
Pt reports being in shower this morning, before heading to his breakfast, and feeling like he was going to pass out. He got out of the shower, laid down, and called a friend to bring him here. He states he no longer feels that way and now feels 100% after eating a sandwich here. Neuro intact, pt denies dizziness/lightheadedness.

## 2017-04-09 NOTE — ED Triage Notes (Signed)
PT was taking a shower and began to feel as if he was going to pass out, and "wasn't thinking straight".  He was able to dry himself off and went to lay down.  After 7 minutes, he was able to get up and eat something.  He then walked over to his neighbor's and asked him to bring him here.

## 2017-04-09 NOTE — Discharge Instructions (Signed)
Dr. Einar Gip would be happy to see you in their clinic to ensure your heart is still strong and there is no new concerns for arrhythmia.  Be very careful with walking. If you faint again - return to the ER.

## 2017-04-09 NOTE — ED Provider Notes (Signed)
[place] DEPT Provider Note   CSN: [id number] Arrival date & time: 04/09/17  1335     History   Chief Complaint Chief Complaint  Patient presents with  . Near Syncope    HPI [name] is a 83 y.o. male.  HPI  83 y.o. male with past medical history significant for paroxysmal A. fib, hyperlipidemia and CVA comes in with cc of near syncope. Pt reports that he was in the bathroom when he suddenly felt that he might pass out. Pt's symptoms lasted just for a short while, and he came out of the shower, laid in the bed for a little bit and then walked over to his neighbor's to ask them to bring him here. Pt had no associated chest pain, dib, palpitations, diaphoresis, nausea. Pt reports no dizziness or near syncope since that episode, including when he ambulated out of the shower and went to the neighbors. Pt reports no new meds. He also denies blood loss or emesis/diarrhea.   Past Medical History:  Diagnosis Date  . Atrial fibrillation ([place])   . Hyperlipidemia   . Hypertension     Patient Active Problem List   Diagnosis Date Noted  . Atrial fibrillation with rapid ventricular response ([place]) 11/29/2013  . CVA (cerebral infarction) 11/29/2013  . OTHER VITAMIN B12 DEFICIENCY ANEMIA 01/08/2008  . ANAL FISTULA 01/08/2008    Past Surgical History:  Procedure Laterality Date  . JOINT REPLACEMENT         Home Medications    Prior to Admission medications   Medication Sig Start Date End Date Taking? Authorizing Provider  amiodarone (PACERONE) 100 MG tablet Take 1 tablet (100 mg total) by mouth daily. 12/10/13  Yes Angiulli, Lavon Paganini, PA-C  apixaban (ELIQUIS) 2.5 MG TABS tablet Take 2.5 mg by mouth daily.    Yes [provider]  cholecalciferol (VITAMIN D) 1000 UNITS tablet Take 1,000 Units by mouth daily.   Yes [provider]  metoprolol tartrate (LOPRESSOR) 25 MG tablet Take 1 tablet (25 mg total) by mouth 2 (two) times daily. Patient taking  differently: Take 25 mg by mouth daily.  12/10/13  Yes Angiulli, Lavon Paganini, PA-C  Multiple Vitamins-Minerals (ICAPS AREDS 2 PO) Take 1 capsule by mouth daily.   Yes [provider]  Polyethyl Glycol-Propyl Glycol (SYSTANE OP) Place 1 drop into both eyes at bedtime.   Yes [provider]  pravastatin (PRAVACHOL) 20 MG tablet Take 1 tablet (20 mg total) by mouth at bedtime. 12/10/13  Yes Angiulli, Lavon Paganini, PA-C  vitamin B-12 (CYANOCOBALAMIN) 100 MCG tablet Take 100 mcg by mouth daily.    Yes [provider]  vitamin E 400 UNIT capsule Take 800 Units by mouth daily.   Yes [provider]    Family History No family history on file.  Social History Social History  Substance Use Topics  . Smoking status: Never Smoker  . Smokeless tobacco: Never Used  . Alcohol use No     Allergies   Codeine   Review of Systems Review of Systems  Respiratory: Negative for shortness of breath.   Cardiovascular: Negative for chest pain and palpitations.  Gastrointestinal: Negative for blood in stool.  Neurological: Positive for dizziness.  All other systems reviewed and are negative.   Physical Exam Updated Vital Signs BP 137/65 (BP Location: Left Arm)   Pulse 68   Temp 97.5 F (36.4 C) (Oral)   Resp 15   Ht 5\' 8"  (1.727 m)   Wt  63.5 kg (140 lb)   SpO2 98%   BMI 21.29 kg/m   Physical Exam  Constitutional: He is oriented to person, place, and time. He appears well-developed.  HENT:  Head: Atraumatic.  Neck: Neck supple. No JVD present.  Cardiovascular: Normal rate.   Murmur heard. Pulmonary/Chest: Effort normal.  Abdominal: Soft. He exhibits no mass. There is no tenderness.  Musculoskeletal: He exhibits no edema or tenderness.  Neurological: He is alert and oriented to person, place, and time.  Skin: Skin is warm.  Nursing note and vitals reviewed.    ED Treatments / Results  Labs (all labs ordered are listed, but only abnormal results are  displayed) Labs Reviewed  BASIC METABOLIC PANEL - Abnormal; Notable for the following:       Result Value   Glucose, Bld 122 (*)    BUN 31 (*)    Creatinine, Ser 1.77 (*)    GFR calc non Af Amer 31 (*)    GFR calc Af Amer 36 (*)    All other components within normal limits  CBC - Abnormal; Notable for the following:    RBC 3.95 (*)    Hemoglobin 12.3 (*)    HCT 38.9 (*)    All other components within normal limits  URINALYSIS, ROUTINE W REFLEX MICROSCOPIC - Abnormal; Notable for the following:    Color, Urine AMBER (*)    APPearance HAZY (*)    Leukocytes, UA LARGE (*)    Bacteria, UA RARE (*)    Squamous Epithelial / LPF 0-5 (*)    All other components within normal limits  CBG MONITORING, ED - Abnormal; Notable for the following:    Glucose-Capillary 125 (*)    All other components within normal limits  CBG MONITORING, ED    EKG  EKG Interpretation  Date/Time:  Wednesday April 09 2017 13:42:43 EDT Ventricular Rate:  68 PR Interval:  162 QRS Duration: 88 QT Interval:  422 QTC Calculation: 448 R Axis:   90 Text Interpretation:  Sinus rhythm with Premature supraventricular complexes Rightward axis Borderline ECG Interpretation limited secondary to artifact irregular rhythm, possibly sinus arrhythmia or sinur rhythm with PAC Confirmed by Varney Biles [id number]) on 04/09/2017 5:12:02 PM       Radiology No results found.  Procedures Procedures (including critical care time)  Medications Ordered in ED Medications - No data to display   Initial Impression / Assessment and Plan / ED Course  I have reviewed the triage vital signs and the nursing notes.  Pertinent labs & imaging results that were available during my care of the patient were reviewed by me and considered in my medical decision making (see chart for details).  Clinical Course as of Apr 10 1208  Wed Apr 09, 2017  2107 Pt's orthostatics are neg. He insists that he would like to go home. Dr. Einar Gip and I  just spoke, and he is happy to see patient in his clinic, he will arrange for event monitoring. Strict ER return precautions have been discussed, and patient is agreeing with the plan and is comfortable with the workup done and the recommendations from the ER.   [AN]    Clinical Course User Index [AN] Varney Biles, MD    DDx includes: Orthostatic hypotension Stroke Vertebral artery dissection/stenosis Dysrhythmia PE Vasovagal/neurocardiogenic syncope Aortic stenosis Valvular disorder/Cardiomyopathy Anemia  Pt with hx of mild AS, AF on blood thinners comes in with cc of near fainting spell. Episode lasted few seconds, w/o any focal neuro  complains and no other concerning prodrome. Pt has hx of AF, he is back in sinus rhythm here - but AF with RVR or other arrhythmia possible. No signs of dehydration or DVT on exam. Pt denies blood loss. He has been feeling normal since then single event and wants to go home - but has agreed to stay for monitoring in the ER. Pt has seen Dr. Einar Gip, Cardiology -and reports that an event monitor used recently for 2 weeks was neg for arrhythmia. I doubt this was a stroke / neurologic event - although he did have a posterior stroke in 2015 - the MRA didn't show any aneurysms or severe obstruction.  Plan is to monitor for 4 hours. We will then work on disposition. We will try to ensure close f/u with PCP.  Final Clinical Impressions(s) / ED Diagnoses   Final diagnoses:  Near syncope    New Prescriptions Discharge Medication List as of 04/09/2017  9:09 PM       Varney Biles, MD 04/10/17 1210

## 2017-05-09 DIAGNOSIS — R350 Frequency of micturition: Secondary | ICD-10-CM | POA: Diagnosis not present

## 2017-05-09 DIAGNOSIS — I480 Paroxysmal atrial fibrillation: Secondary | ICD-10-CM | POA: Diagnosis not present

## 2017-05-09 DIAGNOSIS — I10 Essential (primary) hypertension: Secondary | ICD-10-CM | POA: Diagnosis not present

## 2017-05-09 DIAGNOSIS — R55 Syncope and collapse: Secondary | ICD-10-CM | POA: Diagnosis not present

## 2017-07-15 ENCOUNTER — Emergency Department (HOSPITAL_COMMUNITY): Payer: Medicare Other

## 2017-07-15 ENCOUNTER — Emergency Department (HOSPITAL_COMMUNITY)
Admission: EM | Admit: 2017-07-15 | Discharge: 2017-07-15 | Disposition: A | Discharge status: Home / Self Care | Payer: Medicare Other | Attending: Emergency Medicine | Admitting: Emergency Medicine

## 2017-07-15 ENCOUNTER — Encounter (HOSPITAL_COMMUNITY): Payer: Self-pay | Admitting: *Deleted

## 2017-07-15 DIAGNOSIS — Z79899 Other long term (current) drug therapy: Secondary | ICD-10-CM | POA: Diagnosis not present

## 2017-07-15 DIAGNOSIS — R413 Other amnesia: Secondary | ICD-10-CM | POA: Diagnosis not present

## 2017-07-15 DIAGNOSIS — I10 Essential (primary) hypertension: Secondary | ICD-10-CM | POA: Diagnosis not present

## 2017-07-15 DIAGNOSIS — Z7901 Long term (current) use of anticoagulants: Secondary | ICD-10-CM | POA: Diagnosis not present

## 2017-07-15 DIAGNOSIS — Z8673 Personal history of transient ischemic attack (TIA), and cerebral infarction without residual deficits: Secondary | ICD-10-CM | POA: Insufficient documentation

## 2017-07-15 DIAGNOSIS — D68318 Other hemorrhagic disorder due to intrinsic circulating anticoagulants, antibodies, or inhibitors: Secondary | ICD-10-CM | POA: Diagnosis not present

## 2017-07-15 DIAGNOSIS — R410 Disorientation, unspecified: Secondary | ICD-10-CM | POA: Diagnosis not present

## 2017-07-15 LAB — PROTIME-INR
INR: 1.06
Prothrombin Time: 13.7 seconds (ref 11.4–15.2)

## 2017-07-15 LAB — URINALYSIS, ROUTINE W REFLEX MICROSCOPIC
Bilirubin Urine: NEGATIVE
Glucose, UA: NEGATIVE mg/dL
Hgb urine dipstick: NEGATIVE
Ketones, ur: NEGATIVE mg/dL
Nitrite: NEGATIVE
Protein, ur: NEGATIVE mg/dL
Specific Gravity, Urine: 1.02 (ref 1.005–1.030)
pH: 5 (ref 5.0–8.0)

## 2017-07-15 LAB — ETHANOL: Alcohol, Ethyl (B): <5 mg/dL (ref <5)

## 2017-07-15 LAB — COMPREHENSIVE METABOLIC PANEL
ALT: 10 U/L — ABNORMAL LOW (ref 17–63)
AST: 21 U/L (ref 15–41)
Albumin: 3.4 g/dL — ABNORMAL LOW (ref 3.5–5.0)
Alkaline Phosphatase: 59 U/L (ref 38–126)
Anion gap: 8 (ref 5–15)
BUN: 26 mg/dL — ABNORMAL HIGH (ref 6–20)
CO2: 21 mmol/L — ABNORMAL LOW (ref 22–32)
Calcium: 8.8 mg/dL — ABNORMAL LOW (ref 8.9–10.3)
Chloride: 109 mmol/L (ref 101–111)
Creatinine, Ser: 1.65 mg/dL — ABNORMAL HIGH (ref 0.61–1.24)
GFR calc Af Amer: 40 mL/min — ABNORMAL LOW (ref >60)
GFR calc non Af Amer: 34 mL/min — ABNORMAL LOW (ref >60)
Glucose, Bld: 101 mg/dL — ABNORMAL HIGH (ref 65–99)
Potassium: 4.4 mmol/L (ref 3.5–5.1)
Sodium: 138 mmol/L (ref 135–145)
Total Bilirubin: 0.5 mg/dL (ref 0.3–1.2)
Total Protein: 6.8 g/dL (ref 6.5–8.1)

## 2017-07-15 LAB — RAPID URINE DRUG SCREEN, HOSP PERFORMED
Amphetamines: NOT DETECTED
Barbiturates: NOT DETECTED
Benzodiazepines: NOT DETECTED
Cocaine: NOT DETECTED
Opiates: NOT DETECTED
Tetrahydrocannabinol: NOT DETECTED

## 2017-07-15 LAB — CBC
HCT: 37.1 % — ABNORMAL LOW (ref 39.0–52.0)
Hemoglobin: 11.9 g/dL — ABNORMAL LOW (ref 13.0–17.0)
MCH: 31.1 pg (ref 26.0–34.0)
MCHC: 32.1 g/dL (ref 30.0–36.0)
MCV: 96.9 fL (ref 78.0–100.0)
Platelets: 239 10*3/uL (ref 150–400)
RBC: 3.83 MIL/uL — ABNORMAL LOW (ref 4.22–5.81)
RDW: 13.2 % (ref 11.5–15.5)
WBC: 7.5 10*3/uL (ref 4.0–10.5)

## 2017-07-15 LAB — I-STAT TROPONIN, ED: Troponin i, poc: 0.02 ng/mL (ref 0.00–0.08)

## 2017-07-15 NOTE — ED Notes (Signed)
Patient given urinal and attempting to provide urine sample at this time. Updated on plan of care - waiting for test results and urine. Patient verbalized understanding and states he is willing to wait, but is wanting to leave soon so he can get something to eat. RN offered to get something for him, but he replied that he wanted something outside of this hospital.

## 2017-07-15 NOTE — ED Triage Notes (Signed)
To ED for eval of a moment of confusion while driving home after breakfast. States he couldn't remember how to get home. Pt states he finally made it home and called Dr Irven Shelling office who told pt to come to [facility]. Pt drove self to ED without any problem. States this has happened before - a yr or two ago. Pt is alert and without neuro deficits noted. Skin w/d, Resp e/u.

## 2017-07-15 NOTE — ED Provider Notes (Signed)
[facility] DEPT Provider Note   CSN: [id number] Arrival date & time: 07/15/17  1619     History   Chief Complaint Chief Complaint  Patient presents with  . Altered Mental Status    HPI [name] is a 83 y.o. male.  [name] is a 83 y.o. Male who presents to the ED complaining of memory loss today. Patient tells me he has a "case of CRS- Can't Remember Sh**." He reports he drove to [facility] for breakfast this am and on his drive home he realized he couldn't remember how to get home. He reports he did eventually make it home and rested at home until he felt safe to drive again and drove himself to the emergency department. He does not remember exactly what happened between breakfast at [facility] and getting himself to the emergency department. He reports this has happened before and thinks he might have some dementia. He denies any complaints currently. He reports driving himself to the ED without difficulty. He is complaint with his Eliquis which he takes after a previous CVA and PAF. He denies fevers, headache, head injury, neck pain, numbness, tingling, weakness, urinary symptoms, chest pain, shortness of breath, abdominal pain, nausea, vomiting.    The history is provided by the patient and medical records. No language interpreter was used.  Altered Mental Status   Associated symptoms include confusion. Pertinent negatives include no weakness.    Past Medical History:  Diagnosis Date  . Atrial fibrillation ([place])   . Hyperlipidemia   . Hypertension     Patient Active Problem List   Diagnosis Date Noted  . Atrial fibrillation with rapid ventricular response ([place]) 11/29/2013  . CVA (cerebral infarction) 11/29/2013  . OTHER VITAMIN B12 DEFICIENCY ANEMIA 01/08/2008  . ANAL FISTULA 01/08/2008    Past Surgical History:  Procedure Laterality Date  . JOINT REPLACEMENT         Home Medications    Prior to Admission medications   Medication Sig Start  Date End Date Taking? Authorizing Provider  apixaban (ELIQUIS) 2.5 MG TABS tablet Take 2.5 mg by mouth daily.    Yes [provider]  cholecalciferol (VITAMIN D) 1000 UNITS tablet Take 1,000 Units by mouth daily.   Yes [provider]  Multiple Vitamins-Minerals (ICAPS AREDS 2 PO) Take 1 capsule by mouth daily.   Yes [provider]  Polyethyl Glycol-Propyl Glycol (SYSTANE OP) Place 1 drop into both eyes at bedtime.   Yes [provider]  vitamin B-12 (CYANOCOBALAMIN) 100 MCG tablet Take 100 mcg by mouth daily.    Yes [provider]  vitamin E 400 UNIT capsule Take 800 Units by mouth daily.   Yes [provider]  pravastatin (PRAVACHOL) 20 MG tablet Take 1 tablet (20 mg total) by mouth at bedtime. Patient not taking: Reported on 07/15/2017 12/10/13   [place], Lavon Paganini, PA-C    Family History No family history on file.  Social History Social History  Substance Use Topics  . Smoking status: Never Smoker  . Smokeless tobacco: Never Used  . Alcohol use No     Allergies   Codeine   Review of Systems Review of Systems  Constitutional: Negative for chills and fever.  HENT: Negative for congestion and sore throat.   Eyes: Negative for visual disturbance.  Respiratory: Negative for cough and shortness of breath.   Cardiovascular: Negative for chest pain.  Gastrointestinal: Negative for abdominal pain, diarrhea, nausea and vomiting.  Genitourinary: Negative for dysuria.  Musculoskeletal: Negative for back pain and neck pain.  Skin: Negative for rash.  Neurological: Negative for dizziness, syncope, weakness, light-headedness, numbness and headaches.  Psychiatric/Behavioral: Positive for confusion.     Physical Exam Updated Vital Signs BP (!) 153/70   Pulse 65   Temp (!) 97.4 F (36.3 C) (Oral)   Resp (!) 22   SpO2 96%   Physical Exam  Constitutional: He appears well-developed and well-nourished. No distress.  Nontoxic  appearing.  HENT:  Head: Normocephalic and atraumatic.  Right Ear: External ear normal.  Left Ear: External ear normal.  Mouth/Throat: Oropharynx is clear and moist.  Eyes: Pupils are equal, round, and reactive to light. Conjunctivae and EOM are normal. Right eye exhibits no discharge. Left eye exhibits no discharge.  Neck: Normal range of motion. Neck supple. No JVD present. No tracheal deviation present.  Cardiovascular: Normal rate, regular rhythm, normal heart sounds and intact distal pulses.   Pulmonary/Chest: Effort normal and breath sounds normal. No stridor. No respiratory distress. He has no wheezes. He has no rales.  Abdominal: Soft. There is no tenderness.  Musculoskeletal: He exhibits no edema.  Lymphadenopathy:    He has no cervical adenopathy.  Neurological: He is alert. No cranial nerve deficit or sensory deficit. He exhibits normal muscle tone. Coordination normal.  Patient is alert and oriented. He has some confusion with the month when asked, but otherwise is alert and oriented 3. His speech is clear and coherent. No pronator drift. Finger to nose intact. Cranial nerves are intact. Strength and sensation is intact in his bilateral upper and lower extremities.  Skin: Skin is warm and dry. Capillary refill takes less than 2 seconds. No rash noted. He is not diaphoretic. No erythema. No pallor.  Psychiatric: He has a normal mood and affect. His behavior is normal.  Nursing note and vitals reviewed.    ED Treatments / Results  Labs (all labs ordered are listed, but only abnormal results are displayed) Labs Reviewed  COMPREHENSIVE METABOLIC PANEL - Abnormal; Notable for the following:       Result Value   CO2 21 (*)    Glucose, Bld 101 (*)    BUN 26 (*)    Creatinine, Ser 1.65 (*)    Calcium 8.8 (*)    Albumin 3.4 (*)    ALT 10 (*)    GFR calc non Af Amer 34 (*)    GFR calc Af Amer 40 (*)    All other components within normal limits  CBC - Abnormal; Notable for the  following:    RBC 3.83 (*)    Hemoglobin 11.9 (*)    HCT 37.1 (*)    All other components within normal limits  URINALYSIS, ROUTINE W REFLEX MICROSCOPIC - Abnormal; Notable for the following:    Leukocytes, UA SMALL (*)    Bacteria, UA RARE (*)    Squamous Epithelial / LPF 0-5 (*)    All other components within normal limits  ETHANOL  PROTIME-INR  RAPID URINE DRUG SCREEN, HOSP PERFORMED  I-STAT TROPONIN, ED    EKG  EKG Interpretation  Date/Time:  Tuesday July 15 2017 18:25:09 EDT Ventricular Rate:  65 PR Interval:    QRS Duration: 90 QT Interval:  442 QTC Calculation: 460 R Axis:   84 Text Interpretation:  Sinus rhythm Borderline right axis deviation no acute changes  Confirmed by Brantley Stage [phone]) on 07/15/2017 7:28:58 PM       Radiology Ct Head Wo Contrast  Result Date:  07/15/2017 CLINICAL DATA:  Confusion. EXAM: CT HEAD WITHOUT CONTRAST TECHNIQUE: Contiguous axial images were obtained from the base of the skull through the vertex without intravenous contrast. COMPARISON:  October 13, 2016 FINDINGS: Brain: No subdural, epidural, or subarachnoid hemorrhage. Ventricles and sulci are prominent but stable. A right temporoparietal infarct is stable. White matter changes are unchanged. Cerebellar lacunar infarct on image 8 appears to have present previously is well. Cerebellum, brainstem, basal cisterns are otherwise normal. No acute cortical ischemia or infarct identified. Vascular: Calcified atherosclerosis5 is seen in the intracranial carotid arteries. Skull: Normal. Negative for fracture or focal lesion. Sinuses/Orbits: No acute finding. Other: None. IMPRESSION: 1. Chronic right-sided infarct as above. No acute intracranial abnormality noted. Electronically Signed   By: Dorise Bullion III M.D   On: 07/15/2017 18:51    Procedures Procedures (including critical care time)  Medications Ordered in ED Medications - No data to display   Initial Impression / Assessment and  Plan / ED Course  I have reviewed the triage vital signs and the nursing notes.  Pertinent labs & imaging results that were available during my care of the patient were reviewed by me and considered in my medical decision making (see chart for details).    This  is a 83 y.o. Male who presents to the ED complaining of memory loss today. Patient tells me he has a "case of CRS- Can't Remember Sh**." He reports he drove to [facility] for breakfast this am and on his drive home he realized he couldn't remember how to get home. He reports he did eventually make it home and rested at home until he felt safe to drive again and drove himself to the emergency department. He does not remember exactly what happened between breakfast at [facility] and getting himself to the emergency department. He reports this has happened before and thinks he might have some dementia. He denies any complaints currently. He reports driving himself to the ED without difficulty. He is complaint with his Eliquis which he takes after a previous CVA and PAF. On exam the patient is afebrile and nontoxic appearing. He is alert and oriented. He is pleasant and joking in the room. He tells me he lives alone at home. Blood work here is unremarkable. Urinalysis is nitrite negative with small leukocytes. He denies urinary symptoms. Will not treat with antibiotics.  CT head shows chronic right-sided infarct. No acute intracranial abnormality. Reevaluation patient tells me he is ready to go home. I discussed that I placed a consult to our care management staff about getting him a home health aide to come check on him. He agrees with this plan. I left a message with Lennox Pippins. I also placed a face-to-face order in the note. Will discharge. I discussed return precautions. I advised the patient to follow-up with their primary care provider this week. I advised the patient to return to the emergency department with new or worsening symptoms  or new concerns. The patient verbalized understanding and agreement with plan.   This patient was discussed with and evaluated by Dr. Oleta Mouse who agrees with assessment and plan.   Final Clinical Impressions(s) / ED Diagnoses   Final diagnoses:  Memory loss  Anticoagulated    New Prescriptions New Prescriptions   No medications on file     Sharmaine Base 07/15/17 2055    Forde Dandy, MD 07/17/17 1326

## 2017-07-15 NOTE — ED Notes (Signed)
Upon going into room to discharge patient, patient not in room. Pt left without receiving his d/c papers and without signing, but after EDP talked with patient about f/u instructions.

## 2017-07-15 NOTE — Discharge Instructions (Signed)
You will get a phone call in the next few days from our nurse about having a home health aid to come check on you at home.

## 2017-07-15 NOTE — ED Notes (Signed)
EDP states okay for patient to eat/drink - ordered regular tray.

## 2017-07-15 NOTE — ED Provider Notes (Signed)
Medical screening examination/treatment/procedure(s) were conducted as a shared visit with non-physician practitioner(s) and myself.  I personally evaluated the patient during the encounter.   EKG Interpretation  Date/Time:  Tuesday July 15 2017 18:25:09 EDT Ventricular Rate:  65 PR Interval:    QRS Duration: 90 QT Interval:  442 QTC Calculation: 460 R Axis:   84 Text Interpretation:  Sinus rhythm Borderline right axis deviation no acute changes  Confirmed by Brantley Stage [phone]) on 07/15/2017 7:28:63 PM      83 year old male who presents with episode of confusion. States he has intermittent memory deficits. Today while driving, became confused and states he did not remember how to get home. He remembered then how to get home after several minutes, but called his doctor when he got home. He was told to come to ED.  He denies confusion. Denies recent infectious symptoms. He is oriented x 4. Remembers his address. Mentation is normal. Neurologically in tact. Vitals stable. Blood work reassuring. No signs of infection. CT head unremarkable.   Possible lapse of memory/early dementia. At this time, he wants to go home, which I feel is appropriate. Home health ordered for him as he lives alone to help him with appointments and obtain PCP follow-up as needed. Strict return and follow-up instructions reviewed. He expressed understanding of all discharge instructions and felt comfortable with the plan of care.    Forde Dandy, MD 07/15/17 2045

## 2017-07-15 NOTE — ED Notes (Signed)
Patient reports he was driving home after breakfast and became disoriented - "I didn't know where I was," but states that he knew something was wrong and after a few minutes, remembered how to get home. He called his doctor and was told to go to the ED - pt drove self here and reports it hasn't happened since. A&O x 4.

## 2017-07-16 ENCOUNTER — Telehealth: Payer: Self-pay | Admitting: *Deleted

## 2017-07-16 NOTE — Telephone Encounter (Signed)
.  Malaisha Silliman J. Clydene Laming, RN, BSN, [place] [phone] [facility] called pt to set up [facility] services; no answer.  Will try again around lunch time.

## 2017-07-17 ENCOUNTER — Telehealth: Payer: Self-pay | Admitting: *Deleted

## 2017-07-17 NOTE — Telephone Encounter (Signed)
As requested, EDCM referred pt to Lauretta Grill, NP of [facility] for PCP services.  Stanton Kidney will visit pt in home to complete PCP needs of this pt as he does not have one listed.  Pt recently had some confusion when driving around town and figured "if a doctor would come to my house, it would be great!"  [facility] faxed referral information to [place].

## 2017-07-17 NOTE — Telephone Encounter (Signed)
Kattia Selley J. Clydene Laming, RN, BSN, [employer] [phone] Spoke with pt via telephone regarding discharge planning for [facility]. EDCM read pt list of home health agencies to choose from.  Pt chose [facility] to render services. [facility] of [facility] notified. Patient made aware that [facility] will be in contact in 24-48 hours.  No DME needs identified at this time.

## 2017-07-20 DIAGNOSIS — F0390 Unspecified dementia without behavioral disturbance: Secondary | ICD-10-CM | POA: Diagnosis not present

## 2017-07-20 DIAGNOSIS — I4891 Unspecified atrial fibrillation: Secondary | ICD-10-CM | POA: Diagnosis not present

## 2017-07-20 DIAGNOSIS — Z8673 Personal history of transient ischemic attack (TIA), and cerebral infarction without residual deficits: Secondary | ICD-10-CM | POA: Diagnosis not present

## 2017-07-20 DIAGNOSIS — D510 Vitamin B12 deficiency anemia due to intrinsic factor deficiency: Secondary | ICD-10-CM | POA: Diagnosis not present

## 2017-07-20 DIAGNOSIS — Z7901 Long term (current) use of anticoagulants: Secondary | ICD-10-CM | POA: Diagnosis not present

## 2017-08-08 DIAGNOSIS — Z8673 Personal history of transient ischemic attack (TIA), and cerebral infarction without residual deficits: Secondary | ICD-10-CM | POA: Diagnosis not present

## 2017-08-08 DIAGNOSIS — Z7901 Long term (current) use of anticoagulants: Secondary | ICD-10-CM | POA: Diagnosis not present

## 2017-08-08 DIAGNOSIS — D510 Vitamin B12 deficiency anemia due to intrinsic factor deficiency: Secondary | ICD-10-CM | POA: Diagnosis not present

## 2017-08-08 DIAGNOSIS — F0390 Unspecified dementia without behavioral disturbance: Secondary | ICD-10-CM | POA: Diagnosis not present

## 2017-08-08 DIAGNOSIS — I4891 Unspecified atrial fibrillation: Secondary | ICD-10-CM | POA: Diagnosis not present

## 2017-08-10 ENCOUNTER — Emergency Department (HOSPITAL_COMMUNITY): Payer: Medicare Other

## 2017-08-10 ENCOUNTER — Encounter (HOSPITAL_COMMUNITY): Payer: Self-pay | Admitting: Emergency Medicine

## 2017-08-10 ENCOUNTER — Emergency Department (HOSPITAL_COMMUNITY)
Admission: EM | Admit: 2017-08-10 | Discharge: 2017-08-10 | Disposition: A | Discharge status: Home / Self Care | Payer: Medicare Other | Attending: Emergency Medicine | Admitting: Emergency Medicine

## 2017-08-10 DIAGNOSIS — S0990XA Unspecified injury of head, initial encounter: Secondary | ICD-10-CM

## 2017-08-10 DIAGNOSIS — Y929 Unspecified place or not applicable: Secondary | ICD-10-CM | POA: Insufficient documentation

## 2017-08-10 DIAGNOSIS — S0180XA Unspecified open wound of other part of head, initial encounter: Secondary | ICD-10-CM | POA: Diagnosis not present

## 2017-08-10 DIAGNOSIS — S199XXA Unspecified injury of neck, initial encounter: Secondary | ICD-10-CM | POA: Diagnosis not present

## 2017-08-10 DIAGNOSIS — S51012A Laceration without foreign body of left elbow, initial encounter: Secondary | ICD-10-CM | POA: Diagnosis not present

## 2017-08-10 DIAGNOSIS — Y939 Activity, unspecified: Secondary | ICD-10-CM | POA: Diagnosis not present

## 2017-08-10 DIAGNOSIS — R9431 Abnormal electrocardiogram [ECG] [EKG]: Secondary | ICD-10-CM | POA: Diagnosis not present

## 2017-08-10 DIAGNOSIS — Y999 Unspecified external cause status: Secondary | ICD-10-CM | POA: Insufficient documentation

## 2017-08-10 DIAGNOSIS — S299XXA Unspecified injury of thorax, initial encounter: Secondary | ICD-10-CM | POA: Diagnosis not present

## 2017-08-10 DIAGNOSIS — I10 Essential (primary) hypertension: Secondary | ICD-10-CM | POA: Diagnosis not present

## 2017-08-10 DIAGNOSIS — Z7901 Long term (current) use of anticoagulants: Secondary | ICD-10-CM | POA: Diagnosis not present

## 2017-08-10 DIAGNOSIS — W010XXA Fall on same level from slipping, tripping and stumbling without subsequent striking against object, initial encounter: Secondary | ICD-10-CM | POA: Insufficient documentation

## 2017-08-10 DIAGNOSIS — Z79899 Other long term (current) drug therapy: Secondary | ICD-10-CM | POA: Insufficient documentation

## 2017-08-10 LAB — CBC
HCT: 36.2 % — ABNORMAL LOW (ref 39.0–52.0)
Hemoglobin: 11.8 g/dL — ABNORMAL LOW (ref 13.0–17.0)
MCH: 31.2 pg (ref 26.0–34.0)
MCHC: 32.6 g/dL (ref 30.0–36.0)
MCV: 95.8 fL (ref 78.0–100.0)
Platelets: 233 10*3/uL (ref 150–400)
RBC: 3.78 MIL/uL — ABNORMAL LOW (ref 4.22–5.81)
RDW: 13.2 % (ref 11.5–15.5)
WBC: 5.1 10*3/uL (ref 4.0–10.5)

## 2017-08-10 LAB — BASIC METABOLIC PANEL
Anion gap: 6 (ref 5–15)
BUN: 28 mg/dL — ABNORMAL HIGH (ref 6–20)
CO2: 24 mmol/L (ref 22–32)
Calcium: 9 mg/dL (ref 8.9–10.3)
Chloride: 109 mmol/L (ref 101–111)
Creatinine, Ser: 1.48 mg/dL — ABNORMAL HIGH (ref 0.61–1.24)
GFR calc Af Amer: 45 mL/min — ABNORMAL LOW (ref >60)
GFR calc non Af Amer: 39 mL/min — ABNORMAL LOW (ref >60)
Glucose, Bld: 91 mg/dL (ref 65–99)
Potassium: 4.2 mmol/L (ref 3.5–5.1)
Sodium: 139 mmol/L (ref 135–145)

## 2017-08-10 LAB — I-STAT TROPONIN, ED: Troponin i, poc: 0.02 ng/mL (ref 0.00–0.08)

## 2017-08-10 LAB — PROTIME-INR
INR: 1.08
Prothrombin Time: 13.9 seconds (ref 11.4–15.2)

## 2017-08-10 MED ORDER — BACITRACIN ZINC 500 UNIT/GM EX OINT
1.0000 "application " | TOPICAL_OINTMENT | Freq: Two times a day (BID) | CUTANEOUS | Status: DC
  Administered 2017-08-10: 1 via TOPICAL

## 2017-08-10 NOTE — ED Notes (Signed)
Patient transported to CT 

## 2017-08-10 NOTE — ED Triage Notes (Signed)
Pt arrives via EMS from home alone with complaints of a fall out of bed. Pt reports he fell on his head and left elbow. Skin tear noted to left elbow and abrasion to left forehead. Pt reports right rib pain to EMS but has no resolved. Pt reports taking eliquis. A&Ox4.

## 2017-08-10 NOTE — ED Provider Notes (Signed)
[facility] DEPT Provider Note   CSN: [id number] Arrival date & time: 08/10/17  1414     History   Chief Complaint Chief Complaint  Patient presents with  . Fall    HPI [name] is a 83 y.o. male.  83 year old male with atrial fibrillation on Eliquis, HTN, HLD, CVA who p/w fall. The patient was trying to get out of bed and had a mechanical fall. He states he fell forward and landed on his head. He did not lose consciousness. He is a poor historian and cannot recall whether the fall wasearlier this morning or this afternoon. He states his head hurt initially but he denies any pain currently. He also notes that he had some lower anterior chest wall pain on the left side and then on the right side but it is now resolved. No associated shortness of breath, nausea, vomiting, or diaphoresis. He denies any other areas of pain or other injuries from the fall aside from a skin tear on his left elbow. Chart shows tdap in 2014.   The history is provided by the patient.  Fall     Past Medical History:  Diagnosis Date  . Atrial fibrillation ([place])   . Hyperlipidemia   . Hypertension     Patient Active Problem List   Diagnosis Date Noted  . Atrial fibrillation with rapid ventricular response ([place]) 11/29/2013  . CVA (cerebral infarction) 11/29/2013  . OTHER VITAMIN B12 DEFICIENCY ANEMIA 01/08/2008  . ANAL FISTULA 01/08/2008    Past Surgical History:  Procedure Laterality Date  . JOINT REPLACEMENT         Home Medications    Prior to Admission medications   Medication Sig Start Date End Date Taking? Authorizing Provider  apixaban (ELIQUIS) 2.5 MG TABS tablet Take 2.5 mg by mouth daily.     [provider]  cholecalciferol (VITAMIN D) 1000 UNITS tablet Take 1,000 Units by mouth daily.    [provider]  Multiple Vitamins-Minerals (ICAPS AREDS 2 PO) Take 1 capsule by mouth daily.    [provider]  Polyethyl Glycol-Propyl Glycol (SYSTANE  OP) Place 1 drop into both eyes at bedtime.    [provider]  pravastatin (PRAVACHOL) 20 MG tablet Take 1 tablet (20 mg total) by mouth at bedtime. Patient not taking: Reported on 07/15/2017 12/10/13   Angiulli, Lavon Paganini, PA-C  vitamin B-12 (CYANOCOBALAMIN) 100 MCG tablet Take 100 mcg by mouth daily.     [provider]  vitamin E 400 UNIT capsule Take 800 Units by mouth daily.    [provider]    Family History No family history on file.  Social History Social History  Substance Use Topics  . Smoking status: Never Smoker  . Smokeless tobacco: Never Used  . Alcohol use No     Allergies   Codeine   Review of Systems Review of Systems All other systems reviewed and are negative except that which was mentioned in HPI   Physical Exam Updated Vital Signs BP 133/81   Pulse 95   Resp 13   Wt 63.5 kg (140 lb)   SpO2 99%   BMI 21.29 kg/m   Physical Exam  Constitutional: He is oriented to person, place, and time. He appears well-developed and well-nourished. No distress.  HENT:  Head: Normocephalic.  Nose: Nose normal.  Mouth/Throat: Oropharynx is clear and moist.  Abrasion top of central forehead  Eyes: Pupils are equal, round, and reactive to light. Conjunctivae are normal.  Neck: Normal range of motion. Neck supple.  Cardiovascular: Normal rate.  An irregularly irregular rhythm present.  Murmur heard.  Systolic murmur is present  Pulmonary/Chest: Effort normal and breath sounds normal. He exhibits tenderness (very mild L medial anterior lower chest near sternum).  No crepitus  Abdominal: Soft. Bowel sounds are normal. He exhibits no distension. There is no tenderness.  Musculoskeletal: Normal range of motion. He exhibits no edema.  Neurological: He is alert and oriented to person, place, and time.  5/5 strength and normal sensation x all 4 extremities Fluent speech Hard of hearing  Skin: Skin is warm and dry.  Skin tear L arm just  distal to elbow  Psychiatric: He has a normal mood and affect.  Nursing note and vitals reviewed.    ED Treatments / Results  Labs (all labs ordered are listed, but only abnormal results are displayed) Labs Reviewed  BASIC METABOLIC PANEL - Abnormal; Notable for the following:       Result Value   BUN 28 (*)    Creatinine, Ser 1.48 (*)    GFR calc non Af Amer 39 (*)    GFR calc Af Amer 45 (*)    All other components within normal limits  CBC - Abnormal; Notable for the following:    RBC 3.78 (*)    Hemoglobin 11.8 (*)    HCT 36.2 (*)    All other components within normal limits  PROTIME-INR  I-STAT TROPONIN, ED    EKG  EKG Interpretation  Date/Time:  Sunday August 10 2017 14:26:28 EDT Ventricular Rate:  103 PR Interval:    QRS Duration: 89 QT Interval:  368 QTC Calculation: 482 R Axis:   64 Text Interpretation:  Atrial fibrillation Consider left ventricular hypertrophy Borderline prolonged QT interval A fib new from previous Confirmed by Theotis Burrow [phone]) on 08/10/2017 2:30:56 PM       Radiology Dg Chest 2 View  Result Date: 08/10/2017 CLINICAL DATA:  Fall from bed. EXAM: CHEST  2 VIEW COMPARISON:  Radiograph 10/13/2016, CT 06/01/2014 FINDINGS: Normal cardiac silhouette. Large hiatal hernia noted posterior to the heart. A prominent vascular shadow adjacent to the RIGHT upper mediastinum is unchanged. No pulmonary contusion or pleural fluid. No pneumothorax. No fracture identified. IMPRESSION: 1. No evidence of thoracic trauma. 2. Large hiatal hernia. Electronically Signed   By: Suzy Bouchard M.D.   On: 08/10/2017 15:07   Ct Head Wo Contrast  Result Date: 08/10/2017 CLINICAL DATA:  Status post fall with trauma to the head. EXAM: CT HEAD WITHOUT CONTRAST CT CERVICAL SPINE WITHOUT CONTRAST TECHNIQUE: Multidetector CT imaging of the head and cervical spine was performed following the standard protocol without intravenous contrast. Multiplanar CT image  reconstructions of the cervical spine were also generated. COMPARISON:  07/15/2017 FINDINGS: CT HEAD FINDINGS Brain: No evidence of acute infarction, hemorrhage, hydrocephalus, extra-axial collection or mass lesion/mass effect. Stable large area of encephalomalacia from prior right supratentorial infarct. Punctate encephalomalacia from prior cerebellar infarcts is also stable. Brain parenchymal volume loss and periventricular microangiopathy. Extensive areas of hypoattenuation within the brain stem, presumably representing microangiopathy are relatively stable. Stable mildly prominent bifrontal extra-axial fluid spaces are unchanged. Vascular: Calcific atherosclerotic disease at the skullbase. Skull: Normal. Negative for fracture or focal lesion. Sinuses/Orbits: No acute finding. Other: None. CT CERVICAL SPINE FINDINGS Alignment: Minimal anterolisthesis C4 on C5 and C7 on T1. Skull base and vertebrae: No acute fracture. No primary bone lesion or focal pathologic process. Soft tissues and spinal canal: No  prevertebral fluid or swelling. No visible canal hematoma. Disc levels: Multilevel moderate to severe osteoarthritic changes and posterior facet arthropathy. Ligamentous calcifications. Upper chest: Negative. Other: None. IMPRESSION: No acute intracranial abnormality. Atrophy, extensive chronic microvascular disease. Stable appearance of encephalomalacia from large right supratentorial infarct. No evidence of acute traumatic injury to cervical spine. Moderate to severe osteoarthritic changes of the cervical spine. Electronically Signed   By: Fidela Salisbury M.D.   On: 08/10/2017 15:11   Ct Cervical Spine Wo Contrast  Result Date: 08/10/2017 CLINICAL DATA:  Status post fall with trauma to the head. EXAM: CT HEAD WITHOUT CONTRAST CT CERVICAL SPINE WITHOUT CONTRAST TECHNIQUE: Multidetector CT imaging of the head and cervical spine was performed following the standard protocol without intravenous contrast.  Multiplanar CT image reconstructions of the cervical spine were also generated. COMPARISON:  07/15/2017 FINDINGS: CT HEAD FINDINGS Brain: No evidence of acute infarction, hemorrhage, hydrocephalus, extra-axial collection or mass lesion/mass effect. Stable large area of encephalomalacia from prior right supratentorial infarct. Punctate encephalomalacia from prior cerebellar infarcts is also stable. Brain parenchymal volume loss and periventricular microangiopathy. Extensive areas of hypoattenuation within the brain stem, presumably representing microangiopathy are relatively stable. Stable mildly prominent bifrontal extra-axial fluid spaces are unchanged. Vascular: Calcific atherosclerotic disease at the skullbase. Skull: Normal. Negative for fracture or focal lesion. Sinuses/Orbits: No acute finding. Other: None. CT CERVICAL SPINE FINDINGS Alignment: Minimal anterolisthesis C4 on C5 and C7 on T1. Skull base and vertebrae: No acute fracture. No primary bone lesion or focal pathologic process. Soft tissues and spinal canal: No prevertebral fluid or swelling. No visible canal hematoma. Disc levels: Multilevel moderate to severe osteoarthritic changes and posterior facet arthropathy. Ligamentous calcifications. Upper chest: Negative. Other: None. IMPRESSION: No acute intracranial abnormality. Atrophy, extensive chronic microvascular disease. Stable appearance of encephalomalacia from large right supratentorial infarct. No evidence of acute traumatic injury to cervical spine. Moderate to severe osteoarthritic changes of the cervical spine. Electronically Signed   By: Fidela Salisbury M.D.   On: 08/10/2017 15:11    Procedures Procedures (including critical care time)  Medications Ordered in ED Medications  bacitracin ointment 1 application (1 application Topical Given 08/10/17 1510)     Initial Impression / Assessment and Plan / ED Course  I have reviewed the triage vital signs and the nursing  notes.  Pertinent labs & imaging results that were available during my care of the patient were reviewed by me and considered in my medical decision making (see chart for details).     Pt w/ mechanical fall at home, he is a poor historian but remembers reaching for his lamp and falling while trying to do so. He was well-appearing and pleasant on exam, reassuring neurologic exam. He mentioned some lower anterior chest pain that was hydrating from left to right side, not associated with any other symptoms. It is currently resolved. He endorses some mild tenderness near the lower border of his sternum. Based on his reassuring workup I doubt ACS. Tdap current.  labwork reassuring, including negative troponin. Chest x-ray negative. EKG without acute ischemic changes. CT of head and C-spine negative for acute injury. Patient remains well-appearing and comfortable on exam. His description of his chest discomfort and mild tenderness on exam makes ACS is very unlikely. He remains well-appearing and has had no vomiting or confusion. I feel he is safe for discharge but I have extensively reviewed return precautions with him.I instructed him to have a friend to check on him tonight. Patient discharged in satisfactory condition.  Final Clinical Impressions(s) / ED Diagnoses   Final diagnoses:  Injury of head, initial encounter  Skin tear of left elbow without complication, initial encounter    New Prescriptions New Prescriptions   No medications on file     Woody Kronberg, Wenda Overland, MD 08/10/17 1537

## 2017-08-14 DIAGNOSIS — I4891 Unspecified atrial fibrillation: Secondary | ICD-10-CM | POA: Diagnosis not present

## 2017-08-14 DIAGNOSIS — Z8673 Personal history of transient ischemic attack (TIA), and cerebral infarction without residual deficits: Secondary | ICD-10-CM | POA: Diagnosis not present

## 2017-08-14 DIAGNOSIS — F0390 Unspecified dementia without behavioral disturbance: Secondary | ICD-10-CM | POA: Diagnosis not present

## 2017-08-14 DIAGNOSIS — D510 Vitamin B12 deficiency anemia due to intrinsic factor deficiency: Secondary | ICD-10-CM | POA: Diagnosis not present

## 2017-08-14 DIAGNOSIS — Z7901 Long term (current) use of anticoagulants: Secondary | ICD-10-CM | POA: Diagnosis not present

## 2017-08-16 DIAGNOSIS — Z23 Encounter for immunization: Secondary | ICD-10-CM | POA: Diagnosis not present

## 2017-08-21 DIAGNOSIS — Z7901 Long term (current) use of anticoagulants: Secondary | ICD-10-CM | POA: Diagnosis not present

## 2017-08-21 DIAGNOSIS — D510 Vitamin B12 deficiency anemia due to intrinsic factor deficiency: Secondary | ICD-10-CM | POA: Diagnosis not present

## 2017-08-21 DIAGNOSIS — Z8673 Personal history of transient ischemic attack (TIA), and cerebral infarction without residual deficits: Secondary | ICD-10-CM | POA: Diagnosis not present

## 2017-08-21 DIAGNOSIS — I4891 Unspecified atrial fibrillation: Secondary | ICD-10-CM | POA: Diagnosis not present

## 2017-08-21 DIAGNOSIS — F0390 Unspecified dementia without behavioral disturbance: Secondary | ICD-10-CM | POA: Diagnosis not present

## 2017-09-05 DIAGNOSIS — F0390 Unspecified dementia without behavioral disturbance: Secondary | ICD-10-CM | POA: Diagnosis not present

## 2017-09-05 DIAGNOSIS — D510 Vitamin B12 deficiency anemia due to intrinsic factor deficiency: Secondary | ICD-10-CM | POA: Diagnosis not present

## 2017-09-05 DIAGNOSIS — I4891 Unspecified atrial fibrillation: Secondary | ICD-10-CM | POA: Diagnosis not present

## 2017-09-05 DIAGNOSIS — Z8673 Personal history of transient ischemic attack (TIA), and cerebral infarction without residual deficits: Secondary | ICD-10-CM | POA: Diagnosis not present

## 2017-09-05 DIAGNOSIS — Z7901 Long term (current) use of anticoagulants: Secondary | ICD-10-CM | POA: Diagnosis not present

## 2017-09-17 ENCOUNTER — Emergency Department (HOSPITAL_COMMUNITY): Payer: Medicare Other

## 2017-09-17 ENCOUNTER — Other Ambulatory Visit: Payer: Self-pay

## 2017-09-17 ENCOUNTER — Encounter (HOSPITAL_COMMUNITY): Payer: Self-pay | Admitting: Emergency Medicine

## 2017-09-17 ENCOUNTER — Observation Stay (HOSPITAL_COMMUNITY): Payer: Medicare Other

## 2017-09-17 ENCOUNTER — Observation Stay (HOSPITAL_COMMUNITY)
Admission: EM | Admit: 2017-09-17 | Discharge: 2017-09-17 | Disposition: A | Discharge status: Home / Self Care | Payer: Medicare Other | Attending: Internal Medicine | Admitting: Internal Medicine

## 2017-09-17 DIAGNOSIS — Z79899 Other long term (current) drug therapy: Secondary | ICD-10-CM | POA: Diagnosis not present

## 2017-09-17 DIAGNOSIS — R001 Bradycardia, unspecified: Secondary | ICD-10-CM | POA: Diagnosis not present

## 2017-09-17 DIAGNOSIS — M48061 Spinal stenosis, lumbar region without neurogenic claudication: Secondary | ICD-10-CM | POA: Insufficient documentation

## 2017-09-17 DIAGNOSIS — I482 Chronic atrial fibrillation, unspecified: Secondary | ICD-10-CM | POA: Diagnosis present

## 2017-09-17 DIAGNOSIS — E785 Hyperlipidemia, unspecified: Secondary | ICD-10-CM | POA: Diagnosis not present

## 2017-09-17 DIAGNOSIS — K449 Diaphragmatic hernia without obstruction or gangrene: Secondary | ICD-10-CM

## 2017-09-17 DIAGNOSIS — R7989 Other specified abnormal findings of blood chemistry: Secondary | ICD-10-CM

## 2017-09-17 DIAGNOSIS — M5136 Other intervertebral disc degeneration, lumbar region: Secondary | ICD-10-CM | POA: Diagnosis not present

## 2017-09-17 DIAGNOSIS — D649 Anemia, unspecified: Secondary | ICD-10-CM | POA: Diagnosis not present

## 2017-09-17 DIAGNOSIS — R112 Nausea with vomiting, unspecified: Secondary | ICD-10-CM | POA: Insufficient documentation

## 2017-09-17 DIAGNOSIS — Z7901 Long term (current) use of anticoagulants: Secondary | ICD-10-CM | POA: Insufficient documentation

## 2017-09-17 DIAGNOSIS — R011 Cardiac murmur, unspecified: Secondary | ICD-10-CM | POA: Diagnosis not present

## 2017-09-17 DIAGNOSIS — Z885 Allergy status to narcotic agent status: Secondary | ICD-10-CM | POA: Insufficient documentation

## 2017-09-17 DIAGNOSIS — R1011 Right upper quadrant pain: Secondary | ICD-10-CM | POA: Diagnosis not present

## 2017-09-17 DIAGNOSIS — Z8673 Personal history of transient ischemic attack (TIA), and cerebral infarction without residual deficits: Secondary | ICD-10-CM | POA: Diagnosis not present

## 2017-09-17 DIAGNOSIS — I130 Hypertensive heart and chronic kidney disease with heart failure and stage 1 through stage 4 chronic kidney disease, or unspecified chronic kidney disease: Secondary | ICD-10-CM | POA: Insufficient documentation

## 2017-09-17 DIAGNOSIS — K5730 Diverticulosis of large intestine without perforation or abscess without bleeding: Secondary | ICD-10-CM | POA: Insufficient documentation

## 2017-09-17 DIAGNOSIS — I509 Heart failure, unspecified: Secondary | ICD-10-CM | POA: Diagnosis not present

## 2017-09-17 DIAGNOSIS — I10 Essential (primary) hypertension: Secondary | ICD-10-CM | POA: Diagnosis present

## 2017-09-17 DIAGNOSIS — R109 Unspecified abdominal pain: Secondary | ICD-10-CM | POA: Diagnosis present

## 2017-09-17 DIAGNOSIS — I4891 Unspecified atrial fibrillation: Secondary | ICD-10-CM

## 2017-09-17 DIAGNOSIS — R1010 Upper abdominal pain, unspecified: Secondary | ICD-10-CM

## 2017-09-17 DIAGNOSIS — R778 Other specified abnormalities of plasma proteins: Secondary | ICD-10-CM

## 2017-09-17 DIAGNOSIS — N189 Chronic kidney disease, unspecified: Secondary | ICD-10-CM | POA: Insufficient documentation

## 2017-09-17 LAB — IRON AND TIBC
Iron: 73 ug/dL (ref 45–182)
Saturation Ratios: 31 % (ref 17.9–39.5)
TIBC: 235 ug/dL — ABNORMAL LOW (ref 250–450)
UIBC: 162 ug/dL

## 2017-09-17 LAB — DIFFERENTIAL
Basophils Absolute: 0 10*3/uL (ref 0.0–0.1)
Basophils Relative: 0 %
Eosinophils Absolute: 0.1 10*3/uL (ref 0.0–0.7)
Eosinophils Relative: 1 %
Lymphocytes Relative: 25 %
Lymphs Abs: 2.2 10*3/uL (ref 0.7–4.0)
Monocytes Absolute: 1.1 10*3/uL — ABNORMAL HIGH (ref 0.1–1.0)
Monocytes Relative: 13 %
Neutro Abs: 5.2 10*3/uL (ref 1.7–7.7)
Neutrophils Relative %: 60 %

## 2017-09-17 LAB — CBC
HCT: 36.7 % — ABNORMAL LOW (ref 39.0–52.0)
Hemoglobin: 12.2 g/dL — ABNORMAL LOW (ref 13.0–17.0)
MCH: 32.3 pg (ref 26.0–34.0)
MCHC: 33.2 g/dL (ref 30.0–36.0)
MCV: 97.1 fL (ref 78.0–100.0)
Platelets: 265 10*3/uL (ref 150–400)
RBC: 3.78 MIL/uL — ABNORMAL LOW (ref 4.22–5.81)
RDW: 13.3 % (ref 11.5–15.5)
WBC: 8.7 10*3/uL (ref 4.0–10.5)

## 2017-09-17 LAB — URINALYSIS, ROUTINE W REFLEX MICROSCOPIC
Bilirubin Urine: NEGATIVE
Glucose, UA: NEGATIVE mg/dL
Hgb urine dipstick: NEGATIVE
Ketones, ur: NEGATIVE mg/dL
Nitrite: NEGATIVE
Protein, ur: NEGATIVE mg/dL
Specific Gravity, Urine: 1.018 (ref 1.005–1.030)
pH: 5 (ref 5.0–8.0)

## 2017-09-17 LAB — RETICULOCYTES
RBC.: 3.51 MIL/uL — ABNORMAL LOW (ref 4.22–5.81)
Retic Count, Absolute: 45.6 10*3/uL (ref 19.0–186.0)
Retic Ct Pct: 1.3 % (ref 0.4–3.1)

## 2017-09-17 LAB — COMPREHENSIVE METABOLIC PANEL
ALT: 13 U/L — ABNORMAL LOW (ref 17–63)
AST: 23 U/L (ref 15–41)
Albumin: 3.6 g/dL (ref 3.5–5.0)
Alkaline Phosphatase: 65 U/L (ref 38–126)
Anion gap: 9 (ref 5–15)
BUN: 36 mg/dL — ABNORMAL HIGH (ref 6–20)
CO2: 22 mmol/L (ref 22–32)
Calcium: 9 mg/dL (ref 8.9–10.3)
Chloride: 107 mmol/L (ref 101–111)
Creatinine, Ser: 1.72 mg/dL — ABNORMAL HIGH (ref 0.61–1.24)
GFR calc Af Amer: 38 mL/min — ABNORMAL LOW (ref >60)
GFR calc non Af Amer: 33 mL/min — ABNORMAL LOW (ref >60)
Glucose, Bld: 105 mg/dL — ABNORMAL HIGH (ref 65–99)
Potassium: 3.9 mmol/L (ref 3.5–5.1)
Sodium: 138 mmol/L (ref 135–145)
Total Bilirubin: 0.6 mg/dL (ref 0.3–1.2)
Total Protein: 7.1 g/dL (ref 6.5–8.1)

## 2017-09-17 LAB — TROPONIN I
Troponin I: 0.03 ng/mL (ref <0.03)
Troponin I: 0.03 ng/mL (ref <0.03)

## 2017-09-17 LAB — VITAMIN B12: Vitamin B-12: 429 pg/mL (ref 180–914)

## 2017-09-17 LAB — FERRITIN: Ferritin: 109 ng/mL (ref 24–336)

## 2017-09-17 LAB — FOLATE: Folate: 16.8 ng/mL (ref >5.9)

## 2017-09-17 LAB — HEMOGLOBIN: Hemoglobin: 11.3 g/dL — ABNORMAL LOW (ref 13.0–17.0)

## 2017-09-17 LAB — LIPASE, BLOOD: Lipase: 28 U/L (ref 11–51)

## 2017-09-17 MED ORDER — SODIUM CHLORIDE 0.9 % IV SOLN: INTRAVENOUS | Status: DC

## 2017-09-17 MED ORDER — SODIUM CHLORIDE 0.9 % IV BOLUS (SEPSIS)
500.0000 mL | Freq: Once | INTRAVENOUS | Status: AC
  Administered 2017-09-17: 500 mL via INTRAVENOUS

## 2017-09-17 MED ORDER — OMEPRAZOLE 20 MG PO CPDR: 20.0000 mg | DELAYED_RELEASE_CAPSULE | Freq: Every day | ORAL | 0 refills | Status: DC

## 2017-09-17 MED ORDER — ORAL CARE MOUTH RINSE: 15.0000 mL | Freq: Two times a day (BID) | OROMUCOSAL | Status: DC

## 2017-09-17 MED ORDER — METOPROLOL TARTRATE 5 MG/5ML IV SOLN
5.0000 mg | Freq: Once | INTRAVENOUS | Status: AC
  Administered 2017-09-17: 5 mg via INTRAVENOUS
  Filled 2017-09-17: qty 5

## 2017-09-17 MED ORDER — ONDANSETRON HCL 4 MG/2ML IJ SOLN
4.0000 mg | Freq: Once | INTRAMUSCULAR | Status: AC
  Administered 2017-09-17: 4 mg via INTRAVENOUS
  Filled 2017-09-17: qty 2

## 2017-09-17 MED ORDER — PANTOPRAZOLE SODIUM 40 MG IV SOLR
40.0000 mg | Freq: Once | INTRAVENOUS | Status: AC
  Administered 2017-09-17: 40 mg via INTRAVENOUS
  Filled 2017-09-17: qty 40

## 2017-09-17 MED ORDER — KCL IN DEXTROSE-NACL 10-5-0.45 MEQ/L-%-% IV SOLN
INTRAVENOUS | Status: DC
  Administered 2017-09-17: 13:00:00 via INTRAVENOUS
  Filled 2017-09-17 (×2): qty 1000

## 2017-09-17 MED ORDER — FAMOTIDINE IN NACL 20-0.9 MG/50ML-% IV SOLN
20.0000 mg | Freq: Once | INTRAVENOUS | Status: AC
  Administered 2017-09-17: 20 mg via INTRAVENOUS
  Filled 2017-09-17: qty 50

## 2017-09-17 MED ORDER — POLYETHYLENE GLYCOL 3350 17 G PO PACK: 17.0000 g | PACK | Freq: Two times a day (BID) | ORAL | Status: DC

## 2017-09-17 MED ORDER — TECHNETIUM TC 99M MEBROFENIN IV KIT
5.3000 | PACK | Freq: Once | INTRAVENOUS | Status: AC | PRN
  Administered 2017-09-17: 5.3 via INTRAVENOUS

## 2017-09-17 MED ORDER — IOPAMIDOL (ISOVUE-300) INJECTION 61%
INTRAVENOUS | Status: AC
  Administered 2017-09-17: 75 mL via INTRAVENOUS
  Filled 2017-09-17: qty 100

## 2017-09-17 MED ORDER — ONDANSETRON HCL 4 MG PO TABS: 4.0000 mg | ORAL_TABLET | Freq: Four times a day (QID) | ORAL | Status: DC | PRN

## 2017-09-17 MED ORDER — FENTANYL CITRATE (PF) 100 MCG/2ML IJ SOLN: 25.0000 ug | INTRAMUSCULAR | Status: DC | PRN

## 2017-09-17 MED ORDER — ASPIRIN 81 MG PO CHEW
324.0000 mg | CHEWABLE_TABLET | Freq: Once | ORAL | Status: AC
  Administered 2017-09-17: 324 mg via ORAL
  Filled 2017-09-17: qty 4

## 2017-09-17 MED ORDER — ONDANSETRON HCL 4 MG/2ML IJ SOLN: 4.0000 mg | Freq: Four times a day (QID) | INTRAMUSCULAR | Status: DC | PRN

## 2017-09-17 MED ORDER — IOPAMIDOL (ISOVUE-300) INJECTION 61%
100.0000 mL | Freq: Once | INTRAVENOUS | Status: AC | PRN
  Administered 2017-09-17: 75 mL via INTRAVENOUS

## 2017-09-17 MED ORDER — CHLORHEXIDINE GLUCONATE 0.12 % MT SOLN: 15.0000 mL | Freq: Two times a day (BID) | OROMUCOSAL | Status: DC

## 2017-09-17 MED ORDER — FENTANYL CITRATE (PF) 100 MCG/2ML IJ SOLN
50.0000 ug | Freq: Once | INTRAMUSCULAR | Status: AC
  Administered 2017-09-17: 50 ug via INTRAVENOUS
  Filled 2017-09-17: qty 2

## 2017-09-17 MED ORDER — PANTOPRAZOLE SODIUM 40 MG IV SOLR: 40.0000 mg | Freq: Two times a day (BID) | INTRAVENOUS | Status: DC

## 2017-09-17 NOTE — H&P (Addendum)
History and Physical    MILANO ROSEVEAR BTD:[id number] DOB: 03-29-1922 DOA: 09/17/2017  PCP: Patient, No Pcp Per   Patient coming from: Home.  I have personally briefly reviewed patient's old medical records in [place]  Chief Complaint: Abdominal pain.  HPI: [name] is a 83 y.o. male with medical history significant of chronic atrial fibrillation, hyperlipidemia, hypertension, history of CVA who is coming to the emergency department with complaints of progressively worse RUQ pain since Monday evening, which worsens with deep inspiration.  He denies fever, chills, nausea, emesis, diarrhea, constipation, melena or hematochezia.  No dysuria, frequency or hematuria. Denies chest pain, dyspnea, palpitations, dizziness, diaphoresis, PND, orthopnea or pitting edema of the lower extremities.  ED Course: Initial vital signs temperature 36.6C, pulse 53, respirations 17, blood pressure 131/79 mmHg and O2 sat 96% on room air.  The patient was given aspirin 324 mg x1, pantoprazole 40 mg IVP, NS bolus of 500 mL x1, Zofran 4 mg IVP x1 and fentanyl 50 mcg IVP x1.  He subsequently developed atrial fibrillation with RVR, which subsided with 5 mg of Lopressor IVP.  His workup shows an urinalysis with moderate leukocyte esterase, 5-30 WBCs and rare bacteria on microscopic exam.  WBC of 8.7, hemoglobin 12.2 g/dL and platelets 265.  CMP shows glucose of 105, BUN of 36 and creatinine of 1.72 mg/dL.  All other values are unremarkable.  Imaging: Ultrasound of RUQ which did not show cholelithiasis or cholecystitis.  There is questionable fatty infiltration of the liver per radiology report. CT abdomen/pelvis with contrast shows large hiatal hernia with predominantly intrathoracic location of the stomach.  There was also rectosigmoid diverticulosis without any inflammatory changes.  Aortic atherosclerosis and multilevel DJD of the lumbar spine with severe neuroforaminal stenosis and moderate to  severe L3-L4 spinal canal stenosis.  Please see images and full radiology report for further detail.  Review of Systems: As per HPI otherwise 10 point review of systems negative.    Past Medical History:  Diagnosis Date  . Atrial fibrillation ([place])   . Hyperlipidemia   . Hypertension History of CVA     Past Surgical History:  Procedure Laterality Date  . JOINT REPLACEMENT       reports that  has never smoked. he has never used smokeless tobacco. He reports that he does not drink alcohol or use drugs.  Allergies  Allergen Reactions  . Codeine Nausea And Vomiting    Family History  Problem Relation Age of Onset  . Cancer Mother     Prior to Admission medications   Medication Sig Start Date End Date Taking? Authorizing Provider  apixaban (ELIQUIS) 2.5 MG TABS tablet Take 2.5 mg by mouth daily.    Yes [provider]  cholecalciferol (VITAMIN D) 1000 UNITS tablet Take 1,000 Units by mouth daily.   Yes [provider]  Multiple Vitamins-Minerals (RA VISION-VITE PRESERVE PO) Take 1 tablet by mouth daily.   Yes [provider]  Polyethyl Glycol-Propyl Glycol (SYSTANE OP) Place 1 drop into both eyes at bedtime.   Yes [provider]  vitamin B-12 (CYANOCOBALAMIN) 1000 MCG tablet Take 1,000 mcg by mouth daily.   Yes [provider]  vitamin E 400 UNIT capsule Take 400 Units by mouth daily.    Yes [provider]  pravastatin (PRAVACHOL) 20 MG tablet Take 1 tablet (20 mg total) by mouth at bedtime. Patient not taking: Reported on 07/15/2017 12/10/13  Cathlyn Parsons, PA-C    Physical Exam: Vitals:   09/17/17 0615 09/17/17 0630 09/17/17 0645 09/17/17 0700  BP: 140/90   134/80  Pulse: 89 91 85   Resp: 19 (!) 24 20   Temp:      TempSrc:      SpO2: 94% 96% 94%     Constitutional: Elderly, frail, but in NAD, calm, comfortable.  Pleasant. Eyes: PERRL, lids and conjunctivae normal ENMT: Mucous membranes are mildly dry.  Posterior pharynx clear of any exudate or lesions.  Neck: Normal, supple, no masses, no thyromegaly Respiratory: Decreased breath sounds on bases, otherwise clear to auscultation bilaterally, no wheezing, no crackles. Normal respiratory effort. No accessory muscle use.  Cardiovascular: Irregularly irregular, no murmurs / rubs / gallops. No extremity edema. 2+ pedal pulses. No carotid bruits.  Abdomen: Soft, positive RUQ tenderness, no guarding/rebound/masses palpated. No hepatosplenomegaly. Bowel sounds positive.  Musculoskeletal: no clubbing / cyanosis. Good ROM, no contractures. Normal muscle tone.  Skin: Scattered ecchymoses of arms and legs. Neurologic: CN 2-12 grossly intact. Sensation intact, DTR normal. Strength 5/5 in all 4.  Psychiatric: Normal judgment and insight. Alert and oriented x 4. Normal mood.    Labs on Admission: I have personally reviewed following labs and imaging studies  CBC: Recent Labs  Lab 09/17/17 0111  WBC 8.7  NEUTROABS 5.2  HGB 12.2*  HCT 36.7*  MCV 97.1  PLT 767   Basic Metabolic Panel: Recent Labs  Lab 09/17/17 0111  NA 138  K 3.9  CL 107  CO2 22  GLUCOSE 105*  BUN 36*  CREATININE 1.72*  CALCIUM 9.0   GFR: CrCl cannot be calculated (Unknown ideal weight.). Liver Function Tests: Recent Labs  Lab 09/17/17 0111  AST 23  ALT 13*  ALKPHOS 65  BILITOT 0.6  PROT 7.1  ALBUMIN 3.6   Recent Labs  Lab 09/17/17 0111  LIPASE 28   No results for input(s): AMMONIA in the last 168 hours. Coagulation Profile: No results for input(s): INR, PROTIME in the last 168 hours. Cardiac Enzymes: Recent Labs  Lab 09/17/17 0413  TROPONINI 0.03*   BNP (last 3 results) No results for input(s): PROBNP in the last 8760 hours. HbA1C: No results for input(s): HGBA1C in the last 72 hours. CBG: No results for input(s): GLUCAP in the last 168 hours. Lipid Profile: No results for input(s): CHOL, HDL, LDLCALC, TRIG, CHOLHDL, LDLDIRECT in the last 72  hours. Thyroid Function Tests: No results for input(s): TSH, T4TOTAL, FREET4, T3FREE, THYROIDAB in the last 72 hours. Anemia Panel: No results for input(s): VITAMINB12, FOLATE, FERRITIN, TIBC, IRON, RETICCTPCT in the last 72 hours. Urine analysis:    Component Value Date/Time   COLORURINE YELLOW 09/17/2017 0111   APPEARANCEUR CLEAR 09/17/2017 0111   LABSPEC 1.018 09/17/2017 0111   PHURINE 5.0 09/17/2017 0111   GLUCOSEU NEGATIVE 09/17/2017 0111   HGBUR NEGATIVE 09/17/2017 0111   BILIRUBINUR NEGATIVE 09/17/2017 0111   KETONESUR NEGATIVE 09/17/2017 0111   PROTEINUR NEGATIVE 09/17/2017 0111   UROBILINOGEN 0.2 11/29/2013 1619   NITRITE NEGATIVE 09/17/2017 0111   LEUKOCYTESUR MODERATE (A) 09/17/2017 0111    Radiological Exams on Admission: Ct Abdomen Pelvis W Contrast  Result Date: 09/17/2017 CLINICAL DATA:  Right upper quadrant pain with nausea and vomiting. EXAM: CT ABDOMEN AND PELVIS WITH CONTRAST TECHNIQUE: Multidetector CT imaging of the abdomen and pelvis was performed using the standard protocol following bolus administration of intravenous contrast. CONTRAST:  75 mL Isovue-300 COMPARISON:  CT abdomen pelvis 06/01/2014 Abdominal  ultrasound 09/17/2017 FINDINGS: Lower chest: No pulmonary nodules or pleural effusion. No visible pericardial effusion. Hepatobiliary: Multiple subcentimeter hypodensities, most likely hepatic cyst. Normal gallbladder. Pancreas: Normal contours without ductal dilatation. No peripancreatic fluid collection. Spleen: Normal. Adrenals/Urinary Tract: --Adrenal glands: Normal. --Right kidney/ureter: No hydronephrosis or perinephric stranding. No nephrolithiasis. No obstructing ureteral stones. 1.5 cm cyst. --Left kidney/ureter: No hydronephrosis or perinephric stranding. No nephrolithiasis. No obstructing ureteral stones. 7.3 cm cyst. --Urinary bladder: Unremarkable. Stomach/Bowel: --Stomach/Duodenum: Incompletely visualized hiatal hernia. Most of the stomach is  intrathoracic. --Small bowel: No dilatation or inflammation. --Colon: Rectosigmoid diverticulosis without acute inflammation. --Appendix: Not visualized. No right lower quadrant inflammation or free fluid. Vascular/Lymphatic: Atherosclerotic calcification is present within the non-aneurysmal abdominal aorta, without hemodynamically significant stenosis. No abdominal or pelvic lymphadenopathy. Reproductive: Normal prostate and seminal vesicles. Musculoskeletal. Multilevel degenerative disc disease and facet arthrosis. No bony spinal canal stenosis. There is moderate to severe multilevel foraminal stenosis, worst at L5-S1. Moderate to severe spinal canal stenosis at L3-L4. Other: None. IMPRESSION: 1. No acute abdominopelvic abnormality. 2. Incompletely visualized large hiatal hernia with predominantly intrathoracic location of the stomach. Rectosigmoid diverticulosis without acute inflammation. 3.  Aortic Atherosclerosis (ICD10-I70.0). 4. Multilevel lumbar degenerative disc disease with moderate to severe neural foraminal stenosis and moderate to severe L3-L4 spinal canal stenosis. Electronically Signed   By: Ulyses Jarred M.D.   On: 09/17/2017 04:41   US Abdomen Limited Ruq  Result Date: 09/17/2017 CLINICAL DATA:  Right upper quadrant pain since yesterday EXAM: ULTRASOUND ABDOMEN LIMITED RIGHT UPPER QUADRANT COMPARISON:  CT abdomen and pelvis 06/01/2014. Ultrasound abdomen 01/23/2010 FINDINGS: Gallbladder: Limited visualization. No gallstones, sludge, or gallbladder wall thickening identified. Murphy's sign is not indicated. Common bile duct: Diameter: 3 mm, normal Liver: Increased hepatic parenchymal echotexture likely representing diffuse fatty infiltration. Portal vein is patent on color Doppler imaging with normal direction of blood flow towards the liver. IMPRESSION: No evidence of cholelithiasis or cholecystitis. Probable fatty infiltration of the liver. Electronically Signed   By: Lucienne Capers M.D.    On: 09/17/2017 02:37  11/30/2013 echocardiogram ------------------------------------------------------------ LV EF: 50% -  55%  ------------------------------------------------------------ Indications:   Atrial fibrillation - 427.31.  ------------------------------------------------------------ History:  PMH:  Murmur. Congestive heart failure. Stroke. Risk factors: Hypertension. Dyslipidemia.  ------------------------------------------------------------ Study Conclusions  - Left ventricle: The cavity size was normal. There was mild concentric hypertrophy. Systolic function was normal. The estimated ejection fraction was in the range of 50% to 55%. The study is not technically sufficient to allow evaluation of LV diastolic function. - Aortic valve: Normal-sized, mildly calcified annulus. Trileaflet. Calcification. There was mild stenosis. Mild regurgitation. Valve area: 1.15cm^2(VTI). Valve area: 1.26cm^2 (Vmax). - Mitral valve: Calcified annulus. Mild regurgitation. - Left atrium: The atrium was mildly dilated. - Atrial septum: No defect or patent foramen ovale was identified.  EKG: Independently reviewed.  Vent. rate 99 BPM PR interval * ms QRS duration 105 ms QT/QTc 350/450 ms P-R-T axes 105 84 * Sinus tachycardia Multiform ventricular premature complexes Borderline right axis deviation Borderline T wave abnormalities Minimal ST elevation, lateral leads Baseline wander in lead(s) V1 No significant change compared to previous EKG.  Assessment/Plan Principal Problem:   Abdominal pain Observation/telemetry. Keep n.p.o. Gentle and time limited IV hydration. Continue antiemetics as needed. Continue analgesics as needed Pantoprazole 40 mg IVP every 12 hours. Consider GI or general surgery evaluation if no improvement.  Active Problems:   Atrial fibrillation with rapid ventricular response (HCC) CHA?DS?-VASc Score of at least 5. Resolved  with 5 mg  of metoprolol IVP.   Now bradycardic at 58 bpm. Resume Eliquis once cleared for oral intake.    Hyperlipidemia He has stopped taking the pravastatin.    Hypertension Currently not on antihypertensives. If needed, consider low-dose metoprolol. Monitor blood pressure and heart rate.    Anemia Check anemia panel. Check FOBT   DVT prophylaxis: On Eliquis. Code Status: Full code Family Communication:  Disposition Plan: Observation for abdominal pain. Consults called:  Admission status: Observation/telemetry.   Reubin Milan MD [facility] Pager [phone].  If 7PM-7AM, please contact night-coverage [email or web address] Password [facility]  09/17/2017, 7:42 AM

## 2017-09-17 NOTE — Care Management Obs Status (Signed)
[facility] NOTIFICATION   Patient Details  Name: [name] MRN: [id number] Date of Birth: [birth year]   Medicare Observation Status Notification Given:  Yes  Pt signed with "x"  Lynnell Catalan, RN 09/17/2017, 3:38 PM

## 2017-09-17 NOTE — ED Triage Notes (Signed)
Pt brought in by EMS from home with c/o RUQ abdominal pain, onset yesterday.  Pt reports pain is "getting worse"; pt denies N/V/D.

## 2017-09-17 NOTE — ED Notes (Signed)
Bed: PC34 Expected date:  Expected time:  Means of arrival:  Comments: 89 yr left quad pain

## 2017-09-17 NOTE — Discharge Summary (Addendum)
Physician Discharge Summary  [name] KZL:[id number] DOB: January 14, 1922 DOA: 09/17/2017  PCP: Patient, No Pcp Per  Admit date: 09/17/2017 Discharge date: 09/17/2017  Admitted From: home  Disposition:  home  Recommendations for Outpatient Follow-up:  1. Follow up with Dr. Einar Gip next week  Home Health:  none  Equipment/Devices:  None  Discharge Condition:  Stable, improved CODE STATUS:  Full code  Diet recommendation:  Healthy heart    Brief/Interim Summary:  The patient is a 83 year old male with history of chronic atrial fibrillation, hyperlipidemia, hypertension, CVA who presented to the emergency department with pleuritic right upper quadrant pain.  He was afebrile with stable blood pressure.  His urinalysis was equivocal.  His LFTs and lipase were normal.  He had a right upper quadrant ultrasound which did not show any evidence of cholelithiasis or cholecystitis but he did have tenderness with difficulty inspiring in the right upper quadrant consistent with an or Murphy sign.  CT of the abdomen and pelvis also did not demonstrate signs of local inflammatory changes around the gallbladder.  There is no obvious explanation for his right upper quadrant pain on CT.  He underwent a HIDA scan which was negative.  Doubt PE as patient is on chronic anticoagulation.  He had MiraLAX and had a bowel movement but still did not have resolution of his pain.  The patient was started on PPI and advised to follow-up with his primary care doctor who is his cardiologist Dr. Einar Gip if his pain persists.  It is possible that he has some referred pain from his large hiatal hernia, but given his age and comorbidities, I doubt he is a good surgical candidate unless it is an emergency.  His course was also complicated by intermittent A. fib with RVR that occurred when he became excited or exerted himself.  His heart rate would increase to the 140s-150s.  At rest it could be as low as in the 40s or 50s.  He was  supposed to have an appointment with his cardiologist today which he missed and he was advised to follow-up next week for ongoing management of his A. fib with RVR.  He remains on anticoagulation.  No changes was were made to his other medications.  He remained asymptomatic despite A. fib with RVR and these episodes were only very Gwendolyne Welford bursts that lasted a few seconds or a few minutes and then spontaneously resolved on telemetry.  The patient was asking to go home and therefore was discharged on the evening of 11/21.  Discharge Diagnoses:  Principal Problem:   Abdominal pain Active Problems:   Atrial fibrillation with rapid ventricular response (HCC)   Hyperlipidemia   Hypertension   Anemia    Abdominal pain, possibly due to his large hiatal hernia vs. Gastritis or musculoskeletal pain, improved slightly during his stay -  LFTs, lipase wnl -  No gallstones on imaging -  Negative HIDA -  Started PPI -  Outpatient referral to gastroenterology if persistent.    Active Problems:   Atrial fibrillation with rapid ventricular response (HCC), CHA?DS?-VASc Score of at least 5. -  Continued Eliquis -  RVR improved with metoprolol 5mg  IV once in ER -  Other Bryn Perkin bursts of RVR spontaneously resolved with rest/relaxation after only a few minutes -  F/u with cardiologist next week -  troponins were flat at 0.03, inconsistent with ACS.     Hyperlipidemia He has stopped taking the pravastatin because it ran out and he did not pursue  refilling it.    Hypertension Currently not on antihypertensives. Per his cardiologist    Anemia Check anemia panel. Check FOBT  MIld normocytic anemia, iron, B12, and folate are wnl.  Likely due to chronic disease.    Of note, patient became very upset when he was given undercooked potatoes for dinner on 11/21 and demanded to be discharged.  He was frustrated that we had not let him eat for so long and did not figure out what was causing his pain.  As I did  not find anything that appeared life threatening on his work up and his a-fib is a chronic, asymptomatic problem, I discharged him with instructions to follow up with his PCP as an outpatient.    Discharge Instructions  Discharge Instructions    Call MD for:  difficulty breathing, headache or visual disturbances   Complete by:  As directed    Call MD for:  persistant dizziness or light-headedness   Complete by:  As directed    Call MD for:  persistant nausea and vomiting   Complete by:  As directed    Call MD for:  severe uncontrolled pain   Complete by:  As directed    Diet general   Complete by:  As directed    Increase activity slowly   Complete by:  As directed        Medication List    TAKE these medications   apixaban 2.5 MG Tabs tablet Commonly known as:  ELIQUIS Take 2.5 mg by mouth daily.   cholecalciferol 1000 units tablet Commonly known as:  VITAMIN D Take 1,000 Units by mouth daily.   omeprazole 20 MG capsule Commonly known as:  PRILOSEC Take 1 capsule (20 mg total) by mouth daily.   pravastatin 20 MG tablet Commonly known as:  PRAVACHOL Take 1 tablet (20 mg total) by mouth at bedtime.   RA VISION-VITE PRESERVE PO Take 1 tablet by mouth daily.   SYSTANE OP Place 1 drop into both eyes at bedtime.   vitamin B-12 1000 MCG tablet Commonly known as:  CYANOCOBALAMIN Take 1,000 mcg by mouth daily.   vitamin E 400 UNIT capsule Take 400 Units by mouth daily.      Follow-up Information    Adrian Prows, MD. Schedule an appointment as soon as possible for a visit in 1 week(s).   Specialty:  Cardiology Contact information: [address] [place] [address] [phone]          Allergies  Allergen Reactions  . Codeine Nausea And Vomiting    Consultations: none   Procedures/Studies: Nm Hepatobiliary Liver Func  Result Date: 09/17/2017 CLINICAL DATA:  Right upper quadrant pain EXAM: NUCLEAR MEDICINE HEPATOBILIARY IMAGING  TECHNIQUE: Sequential images of the abdomen were obtained out to 60 minutes following intravenous administration of radiopharmaceutical. RADIOPHARMACEUTICALS:  5.3 mCi Tc-15m  Choletec IV COMPARISON:  None. FINDINGS: Prompt uptake and biliary excretion of activity by the liver is seen. Gallbladder activity is visualized, consistent with patency of cystic duct. Biliary activity passes into small bowel, consistent with patent common bile duct. IMPRESSION: Normal study. Electronically Signed   By: Rolm Baptise M.D.   On: 09/17/2017 15:34   Ct Abdomen Pelvis W Contrast  Result Date: 09/17/2017 CLINICAL DATA:  Right upper quadrant pain with nausea and vomiting. EXAM: CT ABDOMEN AND PELVIS WITH CONTRAST TECHNIQUE: Multidetector CT imaging of the abdomen and pelvis was performed using the standard protocol following bolus administration of intravenous contrast. CONTRAST:  75 mL Isovue-300 COMPARISON:  CT abdomen pelvis 06/01/2014 Abdominal ultrasound 09/17/2017 FINDINGS: Lower chest: No pulmonary nodules or pleural effusion. No visible pericardial effusion. Hepatobiliary: Multiple subcentimeter hypodensities, most likely hepatic cyst. Normal gallbladder. Pancreas: Normal contours without ductal dilatation. No peripancreatic fluid collection. Spleen: Normal. Adrenals/Urinary Tract: --Adrenal glands: Normal. --Right kidney/ureter: No hydronephrosis or perinephric stranding. No nephrolithiasis. No obstructing ureteral stones. 1.5 cm cyst. --Left kidney/ureter: No hydronephrosis or perinephric stranding. No nephrolithiasis. No obstructing ureteral stones. 7.3 cm cyst. --Urinary bladder: Unremarkable. Stomach/Bowel: --Stomach/Duodenum: Incompletely visualized hiatal hernia. Most of the stomach is intrathoracic. --Small bowel: No dilatation or inflammation. --Colon: Rectosigmoid diverticulosis without acute inflammation. --Appendix: Not visualized. No right lower quadrant inflammation or free fluid. Vascular/Lymphatic:  Atherosclerotic calcification is present within the non-aneurysmal abdominal aorta, without hemodynamically significant stenosis. No abdominal or pelvic lymphadenopathy. Reproductive: Normal prostate and seminal vesicles. Musculoskeletal. Multilevel degenerative disc disease and facet arthrosis. No bony spinal canal stenosis. There is moderate to severe multilevel foraminal stenosis, worst at L5-S1. Moderate to severe spinal canal stenosis at L3-L4. Other: None. IMPRESSION: 1. No acute abdominopelvic abnormality. 2. Incompletely visualized large hiatal hernia with predominantly intrathoracic location of the stomach. Rectosigmoid diverticulosis without acute inflammation. 3.  Aortic Atherosclerosis (ICD10-I70.0). 4. Multilevel lumbar degenerative disc disease with moderate to severe neural foraminal stenosis and moderate to severe L3-L4 spinal canal stenosis. Electronically Signed   By: Ulyses Jarred M.D.   On: 09/17/2017 04:41   US Abdomen Limited Ruq  Result Date: 09/17/2017 CLINICAL DATA:  Right upper quadrant pain since yesterday EXAM: ULTRASOUND ABDOMEN LIMITED RIGHT UPPER QUADRANT COMPARISON:  CT abdomen and pelvis 06/01/2014. Ultrasound abdomen 01/23/2010 FINDINGS: Gallbladder: Limited visualization. No gallstones, sludge, or gallbladder wall thickening identified. Murphy's sign is not indicated. Common bile duct: Diameter: 3 mm, normal Liver: Increased hepatic parenchymal echotexture likely representing diffuse fatty infiltration. Portal vein is patent on color Doppler imaging with normal direction of blood flow towards the liver. IMPRESSION: No evidence of cholelithiasis or cholecystitis. Probable fatty infiltration of the liver. Electronically Signed   By: Lucienne Capers M.D.   On: 09/17/2017 02:37    none   Subjective: Ongoing right upper quadrant pain worse with inspiration.  Has to breath shallowly to avoid pain.  Denies nausea, vomiting, diarrhea, constipation.    Discharge  Exam: Vitals:   09/17/17 1536 09/17/17 1811  BP: (!) 141/95 (!) 147/90  Pulse: 61 (!) 58  Resp:    Temp:  97.6 F (36.4 C)  SpO2: 98% 99%   Vitals:   09/17/17 0700 09/17/17 0750 09/17/17 1536 09/17/17 1811  BP: 134/80 (!) 148/84 (!) 141/95 (!) 147/90  Pulse:  (!) 58 61 (!) 58  Resp:  20    Temp:  (!) 96 F (35.6 C)  97.6 F (36.4 C)  TempSrc:  Oral Oral Oral  SpO2:  100% 98% 99%  Weight:   61.8 kg (136 lb 3.9 oz)   Height:   5\' 8"  (1.727 m)     General: Pt is alert, awake, not in acute distress Cardiovascular: IRRR, S1/S2 +, no rubs, no gallops Respiratory: CTA bilaterally, no wheezing, no rhonchi Abdominal: Soft, nondistended. TTP in the RUQ with positive Murphy's sign.  NABS.   Extremities: no edema, no cyanosis    The results of significant diagnostics from this hospitalization (including imaging, microbiology, ancillary and laboratory) are listed below for reference.     Microbiology: No results found for this or any previous visit (from the past 240 hour(s)).   Labs:  BNP (last 3 results) No results for input(s): BNP in the last 8760 hours. Basic Metabolic Panel: Recent Labs  Lab 09/17/17 0111  NA 138  K 3.9  CL 107  CO2 22  GLUCOSE 105*  BUN 36*  CREATININE 1.72*  CALCIUM 9.0   Liver Function Tests: Recent Labs  Lab 09/17/17 0111  AST 23  ALT 13*  ALKPHOS 65  BILITOT 0.6  PROT 7.1  ALBUMIN 3.6   Recent Labs  Lab 09/17/17 0111  LIPASE 28   No results for input(s): AMMONIA in the last 168 hours. CBC: Recent Labs  Lab 09/17/17 0111 09/17/17 0956  WBC 8.7  --   NEUTROABS 5.2  --   HGB 12.2* 11.3*  HCT 36.7*  --   MCV 97.1  --   PLT 265  --    Cardiac Enzymes: Recent Labs  Lab 09/17/17 0413 09/17/17 0956  TROPONINI 0.03* 0.03*   BNP: Invalid input(s): POCBNP CBG: No results for input(s): GLUCAP in the last 168 hours. D-Dimer No results for input(s): DDIMER in the last 72 hours. Hgb A1c No results for input(s): HGBA1C in  the last 72 hours. Lipid Profile No results for input(s): CHOL, HDL, LDLCALC, TRIG, CHOLHDL, LDLDIRECT in the last 72 hours. Thyroid function studies No results for input(s): TSH, T4TOTAL, T3FREE, THYROIDAB in the last 72 hours.  Invalid input(s): FREET3 Anemia work up Recent Labs    09/17/17 0956  VITAMINB12 429  FOLATE 16.8  FERRITIN 109  TIBC 235*  IRON 73  RETICCTPCT 1.3   Urinalysis    Component Value Date/Time   COLORURINE YELLOW 09/17/2017 0111   APPEARANCEUR CLEAR 09/17/2017 0111   LABSPEC 1.018 09/17/2017 0111   PHURINE 5.0 09/17/2017 0111   GLUCOSEU NEGATIVE 09/17/2017 0111   HGBUR NEGATIVE 09/17/2017 0111   BILIRUBINUR NEGATIVE 09/17/2017 0111   KETONESUR NEGATIVE 09/17/2017 0111   PROTEINUR NEGATIVE 09/17/2017 0111   UROBILINOGEN 0.2 11/29/2013 1619   NITRITE NEGATIVE 09/17/2017 0111   LEUKOCYTESUR MODERATE (A) 09/17/2017 0111   Sepsis Labs Invalid input(s): PROCALCITONIN,  WBC,  LACTICIDVEN   Time coordinating discharge: Over 30 minutes  SIGNED:   Janece Canterbury, MD  [facility] 09/17/2017, 8:06 PM Pager   If 7PM-7AM, please contact night-coverage [email or web address] Password [email or web address]

## 2017-09-17 NOTE — ED Notes (Signed)
Patient transported to CT 

## 2017-09-17 NOTE — ED Provider Notes (Signed)
TIME SEEN: 1:23 AM  CHIEF COMPLAINT: Abdominal pain  HPI: Patient is a 83 year old male with history of hypertension, hyperlipidemia, atrial fibrillation, chronic kidney disease who presents emergency department with right upper quadrant abdominal pain that started yesterday and progressively worsened throughout the day.  No chest pain or shortness of breath.  States pain is worse with palpation and deep inspiration.  Has had some nausea but no vomiting or diarrhea.  No bloody stool or pain with urination.  No history of abdominal surgeries.  Has never had similar symptoms.  Denies aggravating or alleviating factors.  No radiation of pain.  States he lives at home alone.  ROS: See HPI Constitutional: no fever  Eyes: no drainage  ENT: no runny nose   Cardiovascular:  no chest pain  Resp: no SOB  GI: no vomiting GU: no dysuria Integumentary: no rash  Allergy: no hives  Musculoskeletal: no leg swelling  Neurological: no slurred speech ROS otherwise negative  PAST MEDICAL HISTORY/PAST SURGICAL HISTORY:  Past Medical History:  Diagnosis Date  . Atrial fibrillation ([place])   . Hyperlipidemia   . Hypertension     MEDICATIONS:  Prior to Admission medications   Medication Sig Start Date End Date Taking? Authorizing Provider  apixaban (ELIQUIS) 2.5 MG TABS tablet Take 2.5 mg by mouth daily.     [provider]  cholecalciferol (VITAMIN D) 1000 UNITS tablet Take 1,000 Units by mouth daily.    [provider]  Multiple Vitamins-Minerals (ICAPS AREDS 2 PO) Take 1 capsule by mouth daily.    [provider]  Polyethyl Glycol-Propyl Glycol (SYSTANE OP) Place 1 drop into both eyes at bedtime.    [provider]  pravastatin (PRAVACHOL) 20 MG tablet Take 1 tablet (20 mg total) by mouth at bedtime. Patient not taking: Reported on 07/15/2017 12/10/13   Angiulli, Lavon Paganini, PA-C  vitamin B-12 (CYANOCOBALAMIN) 100 MCG tablet Take 100 mcg by mouth daily.     [provider]  vitamin E 400 UNIT capsule Take 800 Units by mouth daily.    [provider]    ALLERGIES:  Allergies  Allergen Reactions  . Codeine Nausea And Vomiting    SOCIAL HISTORY:  Social History   Tobacco Use  . Smoking status: Never Smoker  . Smokeless tobacco: Never Used  Substance Use Topics  . Alcohol use: No    FAMILY HISTORY: History reviewed. No pertinent family history.  EXAM: BP 131/79 (BP Location: Left Arm)   Pulse (!) 53   Temp 97.8 F (36.6 C) (Oral)   Resp 17   SpO2 96%  CONSTITUTIONAL: Alert and oriented and responds appropriately to questions.  Elderly, smiling and laughing, afebrile and nontoxic HEAD: Normocephalic EYES: Conjunctivae clear, pupils appear equal, EOMI ENT: normal nose; moist mucous membranes NECK: Supple, no meningismus, no nuchal rigidity, no LAD  CARD: irregular irregularly; S1 and S2 appreciated; no murmurs, no clicks, no rubs, no gallops RESP: Normal chest excursion without splinting or tachypnea; breath sounds clear and equal bilaterally; no wheezes, no rhonchi, no rales, no hypoxia or respiratory distress, speaking full sentences ABD/GI: Normal bowel sounds; non-distended; soft, tender in the upper abdomen especially in the right upper quadrant but has negative Murphy sign, no rebound, no guarding, no peritoneal signs, no hepatosplenomegaly, no tenderness at McBurney's point BACK:  The back appears normal and is non-tender to palpation, there is no CVA tenderness EXT: Normal ROM in all joints; non-tender to palpation; no edema; normal capillary refill; no cyanosis,  no calf tenderness or swelling    SKIN: Normal color for age and race; warm; no rash NEURO: Moves all extremities equally PSYCH: The patient's mood and manner are appropriate. Grooming and personal hygiene are appropriate.  MEDICAL DECISION MAKING: Patient here with right-sided abdominal pain.  Differential diagnosis includes cholelithiasis, cholecystitis,  pancreatitis, colitis, bowel obstruction.  Less likely appendicitis, ACS.  Will obtain labs, urine, right upper quadrant ultrasound.  Will give pain and nausea medicine.  ED PROGRESS: Patient's labs are unremarkable other than showing stable chronic kidney disease.  LFTs, lipase normal.  No leukocytosis.  Right upper quadrant abdominal ultrasound is normal other than showing fatty infiltration of the liver.  Will obtain CT of abdomen pelvis, EKG and troponin.   Urine shows no obvious infection.  Patient has a slightly elevated troponin and he is atrial fibrillation with RVR.  Rate goes between 110 and 140.  Will give IV metoprolol.  CT of abdomen pelvis shows hiatal hernia but no other abnormality.  Hiatal hernia could be the cause of his pain but given his A. fib with RVR and his increased age and elevated troponin I have recommended admission.  Have ordered IV Protonix as he is still having pain.   5:10 AM Discussed patient's case with hospitalist, Dr. Olevia Bowens.  I have recommended admission and patient (and family if present) agree with this plan. Admitting physician will place admission orders.   I reviewed all nursing notes, vitals, pertinent previous records, EKGs, lab and urine results, imaging (as available).      EKG Interpretation  Date/Time:  Wednesday September 17 2017 04:50:13 EST Ventricular Rate:  99 PR Interval:    QRS Duration: 105 QT Interval:  350 QTC Calculation: 450 R Axis:   84 Text Interpretation:  Sinus tachycardia Multiform ventricular premature complexes Borderline right axis deviation Borderline T wave abnormalities Minimal ST elevation, lateral leads Baseline wander in lead(s) V1 No significant change since last tracing Confirmed by Jevaun Strick, Cyril Mourning [phone]) on 09/17/2017 4:55:23 AM           Jamair Cato, Delice Bison, DO 09/17/17 2778

## 2017-09-19 ENCOUNTER — Emergency Department (HOSPITAL_COMMUNITY): Payer: Medicare Other

## 2017-09-19 ENCOUNTER — Other Ambulatory Visit: Payer: Self-pay

## 2017-09-19 ENCOUNTER — Observation Stay (HOSPITAL_COMMUNITY)
Admission: EM | Admit: 2017-09-19 | Discharge: 2017-09-20 | Disposition: A | Discharge status: Home / Self Care | Payer: Medicare Other | Attending: Internal Medicine | Admitting: Internal Medicine

## 2017-09-19 ENCOUNTER — Encounter (HOSPITAL_COMMUNITY): Payer: Self-pay | Admitting: Radiology

## 2017-09-19 DIAGNOSIS — M5136 Other intervertebral disc degeneration, lumbar region: Secondary | ICD-10-CM | POA: Diagnosis not present

## 2017-09-19 DIAGNOSIS — Z9660 Presence of unspecified orthopedic joint implant: Secondary | ICD-10-CM | POA: Insufficient documentation

## 2017-09-19 DIAGNOSIS — N400 Enlarged prostate without lower urinary tract symptoms: Secondary | ICD-10-CM | POA: Insufficient documentation

## 2017-09-19 DIAGNOSIS — R0781 Pleurodynia: Secondary | ICD-10-CM | POA: Diagnosis not present

## 2017-09-19 DIAGNOSIS — Z8673 Personal history of transient ischemic attack (TIA), and cerebral infarction without residual deficits: Secondary | ICD-10-CM | POA: Diagnosis not present

## 2017-09-19 DIAGNOSIS — I4891 Unspecified atrial fibrillation: Secondary | ICD-10-CM | POA: Diagnosis not present

## 2017-09-19 DIAGNOSIS — M48061 Spinal stenosis, lumbar region without neurogenic claudication: Secondary | ICD-10-CM | POA: Diagnosis not present

## 2017-09-19 DIAGNOSIS — I4581 Long QT syndrome: Secondary | ICD-10-CM | POA: Insufficient documentation

## 2017-09-19 DIAGNOSIS — N281 Cyst of kidney, acquired: Secondary | ICD-10-CM | POA: Insufficient documentation

## 2017-09-19 DIAGNOSIS — R1011 Right upper quadrant pain: Secondary | ICD-10-CM | POA: Diagnosis not present

## 2017-09-19 DIAGNOSIS — I482 Chronic atrial fibrillation, unspecified: Secondary | ICD-10-CM | POA: Diagnosis present

## 2017-09-19 DIAGNOSIS — K7689 Other specified diseases of liver: Secondary | ICD-10-CM | POA: Insufficient documentation

## 2017-09-19 DIAGNOSIS — N183 Chronic kidney disease, stage 3 (moderate): Secondary | ICD-10-CM | POA: Diagnosis not present

## 2017-09-19 DIAGNOSIS — Z809 Family history of malignant neoplasm, unspecified: Secondary | ICD-10-CM | POA: Diagnosis not present

## 2017-09-19 DIAGNOSIS — J90 Pleural effusion, not elsewhere classified: Secondary | ICD-10-CM | POA: Diagnosis not present

## 2017-09-19 DIAGNOSIS — Z7901 Long term (current) use of anticoagulants: Secondary | ICD-10-CM | POA: Diagnosis not present

## 2017-09-19 DIAGNOSIS — K449 Diaphragmatic hernia without obstruction or gangrene: Secondary | ICD-10-CM | POA: Diagnosis not present

## 2017-09-19 DIAGNOSIS — I2510 Atherosclerotic heart disease of native coronary artery without angina pectoris: Secondary | ICD-10-CM | POA: Insufficient documentation

## 2017-09-19 DIAGNOSIS — D649 Anemia, unspecified: Secondary | ICD-10-CM | POA: Insufficient documentation

## 2017-09-19 DIAGNOSIS — K5730 Diverticulosis of large intestine without perforation or abscess without bleeding: Secondary | ICD-10-CM | POA: Diagnosis not present

## 2017-09-19 DIAGNOSIS — E785 Hyperlipidemia, unspecified: Secondary | ICD-10-CM | POA: Insufficient documentation

## 2017-09-19 DIAGNOSIS — Z885 Allergy status to narcotic agent status: Secondary | ICD-10-CM | POA: Diagnosis not present

## 2017-09-19 DIAGNOSIS — I700 Atherosclerosis of aorta: Secondary | ICD-10-CM | POA: Insufficient documentation

## 2017-09-19 DIAGNOSIS — I129 Hypertensive chronic kidney disease with stage 1 through stage 4 chronic kidney disease, or unspecified chronic kidney disease: Secondary | ICD-10-CM | POA: Diagnosis not present

## 2017-09-19 DIAGNOSIS — Z79899 Other long term (current) drug therapy: Secondary | ICD-10-CM | POA: Diagnosis not present

## 2017-09-19 DIAGNOSIS — I10 Essential (primary) hypertension: Secondary | ICD-10-CM | POA: Diagnosis present

## 2017-09-19 DIAGNOSIS — R109 Unspecified abdominal pain: Secondary | ICD-10-CM | POA: Insufficient documentation

## 2017-09-19 LAB — COMPREHENSIVE METABOLIC PANEL
ALT: 12 U/L — ABNORMAL LOW (ref 17–63)
AST: 22 U/L (ref 15–41)
Albumin: 3.5 g/dL (ref 3.5–5.0)
Alkaline Phosphatase: 70 U/L (ref 38–126)
Anion gap: 8 (ref 5–15)
BUN: 26 mg/dL — ABNORMAL HIGH (ref 6–20)
CO2: 22 mmol/L (ref 22–32)
Calcium: 8.9 mg/dL (ref 8.9–10.3)
Chloride: 109 mmol/L (ref 101–111)
Creatinine, Ser: 1.34 mg/dL — ABNORMAL HIGH (ref 0.61–1.24)
GFR calc Af Amer: 51 mL/min — ABNORMAL LOW (ref >60)
GFR calc non Af Amer: 44 mL/min — ABNORMAL LOW (ref >60)
Glucose, Bld: 101 mg/dL — ABNORMAL HIGH (ref 65–99)
Potassium: 4.1 mmol/L (ref 3.5–5.1)
Sodium: 139 mmol/L (ref 135–145)
Total Bilirubin: 0.8 mg/dL (ref 0.3–1.2)
Total Protein: 7 g/dL (ref 6.5–8.1)

## 2017-09-19 LAB — CBC WITH DIFFERENTIAL/PLATELET
Basophils Absolute: 0 10*3/uL (ref 0.0–0.1)
Basophils Relative: 0 %
Eosinophils Absolute: 0.1 10*3/uL (ref 0.0–0.7)
Eosinophils Relative: 1 %
HCT: 35.4 % — ABNORMAL LOW (ref 39.0–52.0)
Hemoglobin: 11.8 g/dL — ABNORMAL LOW (ref 13.0–17.0)
Lymphocytes Relative: 31 %
Lymphs Abs: 2 10*3/uL (ref 0.7–4.0)
MCH: 32.2 pg (ref 26.0–34.0)
MCHC: 33.3 g/dL (ref 30.0–36.0)
MCV: 96.5 fL (ref 78.0–100.0)
Monocytes Absolute: 0.6 10*3/uL (ref 0.1–1.0)
Monocytes Relative: 9 %
Neutro Abs: 3.8 10*3/uL (ref 1.7–7.7)
Neutrophils Relative %: 59 %
Platelets: 246 10*3/uL (ref 150–400)
RBC: 3.67 MIL/uL — ABNORMAL LOW (ref 4.22–5.81)
RDW: 13.1 % (ref 11.5–15.5)
WBC: 6.4 10*3/uL (ref 4.0–10.5)

## 2017-09-19 LAB — URINALYSIS, ROUTINE W REFLEX MICROSCOPIC
Bacteria, UA: NONE SEEN
Bilirubin Urine: NEGATIVE
Glucose, UA: NEGATIVE mg/dL
Ketones, ur: NEGATIVE mg/dL
Nitrite: NEGATIVE
Protein, ur: NEGATIVE mg/dL
Specific Gravity, Urine: 1.034 — ABNORMAL HIGH (ref 1.005–1.030)
pH: 5 (ref 5.0–8.0)

## 2017-09-19 LAB — LIPASE, BLOOD: Lipase: 23 U/L (ref 11–51)

## 2017-09-19 LAB — I-STAT TROPONIN, ED: Troponin i, poc: 0 ng/mL (ref 0.00–0.08)

## 2017-09-19 MED ORDER — SODIUM CHLORIDE 0.9 % IV BOLUS (SEPSIS)
500.0000 mL | Freq: Once | INTRAVENOUS | Status: AC
  Administered 2017-09-19: 500 mL via INTRAVENOUS

## 2017-09-19 MED ORDER — IOPAMIDOL (ISOVUE-370) INJECTION 76%
INTRAVENOUS | Status: AC
  Administered 2017-09-19: 80 mL via INTRAVENOUS
  Filled 2017-09-19: qty 100

## 2017-09-19 MED ORDER — DEXTROSE 5 % IV SOLN
2.0000 g | Freq: Once | INTRAVENOUS | Status: AC
  Administered 2017-09-20: 2 g via INTRAVENOUS
  Filled 2017-09-19: qty 2

## 2017-09-19 MED ORDER — METOPROLOL TARTRATE 5 MG/5ML IV SOLN
5.0000 mg | Freq: Once | INTRAVENOUS | Status: AC
  Administered 2017-09-19: 5 mg via INTRAVENOUS
  Filled 2017-09-19: qty 5

## 2017-09-19 NOTE — ED Notes (Signed)
Per emergency contact:  Pt has 2 daughters that live in [place]. Pt's friend reports that one of the daughters have nothing to do with him and the other isn't active in his care. Pt's friend Sports administrator) reports that it seems pt's memory is getting worse and that pt was dialing [phone] today in an attempt to get EMS to come get him.

## 2017-09-19 NOTE — ED Notes (Addendum)
RN was educating pt about hiatal hernias and went to retrieve a medication. Upon returning the pt asked the same questions as before and reported not remembering her speaking to him about the hiatal hernia. Pt reports that he lives at home alone and still drives because he would not be able to live if he didn't drive.

## 2017-09-19 NOTE — ED Provider Notes (Signed)
[facility] DEPT Provider Note   CSN: [id number] Arrival date & time: 09/19/17  1808     History   Chief Complaint Chief Complaint  Patient presents with  . Abdominal Pain    HPI [name] is a 83 y.o. male with a past medical history of hypertension, A. fib currently on anticoagulation, presents to ED for evaluation of 3-day history of right upper abdominal pain.  States that the pain started in his lower abdomen and is now on his right upper abdomen.  He has not tried any medications at home to help with pain.  He states that he has not sure "why am having this pain, and I just want someone to tell me why it is happening and if it can be fixed." Of note, patient was seen and evaluated here 2 days ago for the symptoms.  He was admitted and workup including ultrasound, HIDA scan, CT of abdomen and pelvis were done.  Findings consistent with large hiatal hernia were found and CT of the abdomen but otherwise imaging was unremarkable.  His troponin stayed steady at 0.03.  HPI  Past Medical History:  Diagnosis Date  . Atrial fibrillation ([place])   . History of CVA (cerebrovascular accident)   . Hyperlipidemia   . Hypertension     Patient Active Problem List   Diagnosis Date Noted  . Abdominal pain 09/17/2017  . Hyperlipidemia 09/17/2017  . Hypertension 09/17/2017  . Anemia 09/17/2017  . Atrial fibrillation with rapid ventricular response ([place]) 11/29/2013  . CVA (cerebral infarction) 11/29/2013  . OTHER VITAMIN B12 DEFICIENCY ANEMIA 01/08/2008  . ANAL FISTULA 01/08/2008    Past Surgical History:  Procedure Laterality Date  . JOINT REPLACEMENT         Home Medications    Prior to Admission medications   Medication Sig Start Date End Date Taking? Authorizing Provider  cholecalciferol (VITAMIN D) 1000 UNITS tablet Take 1,000 Units by mouth daily.   Yes [provider]  Multiple Vitamins-Minerals (RA VISION-VITE PRESERVE PO)  Take 1 tablet by mouth daily.   Yes [provider]  Polyethyl Glycol-Propyl Glycol (SYSTANE OP) Place 1 drop into both eyes at bedtime.   Yes [provider]  vitamin B-12 (CYANOCOBALAMIN) 1000 MCG tablet Take 1,000 mcg by mouth daily.   Yes [provider]  vitamin E 400 UNIT capsule Take 400 Units by mouth daily.    Yes [provider]  apixaban (ELIQUIS) 2.5 MG TABS tablet Take 2.5 mg by mouth daily.     [provider]  omeprazole (PRILOSEC) 20 MG capsule Take 1 capsule (20 mg total) by mouth daily. 09/17/17 09/17/18  Janece Canterbury, MD  pravastatin (PRAVACHOL) 20 MG tablet Take 1 tablet (20 mg total) by mouth at bedtime. Patient not taking: Reported on 07/15/2017 12/10/13   Angiulli, Lavon Paganini, PA-C    Family History Family History  Problem Relation Age of Onset  . Cancer Mother     Social History Social History   Tobacco Use  . Smoking status: Never Smoker  . Smokeless tobacco: Never Used  Substance Use Topics  . Alcohol use: No  . Drug use: No     Allergies   Codeine   Review of Systems Review of Systems  Constitutional: Negative for appetite change, chills and fever.  HENT: Negative for ear pain, rhinorrhea, sneezing and sore throat.   Eyes: Negative for photophobia and visual disturbance.  Respiratory: Negative for cough, chest tightness, shortness of  breath and wheezing.   Cardiovascular: Negative for chest pain and palpitations.  Gastrointestinal: Positive for abdominal pain. Negative for blood in stool, constipation, diarrhea, nausea and vomiting.  Genitourinary: Negative for dysuria, hematuria and urgency.  Musculoskeletal: Negative for myalgias.  Skin: Negative for rash.  Neurological: Negative for dizziness, weakness and light-headedness.     Physical Exam Updated Vital Signs BP (!) 138/102 (BP Location: Right Arm)   Pulse (!) 144   Temp 97.6 F (36.4 C) (Oral)   Resp 20   Ht 5\' 8"  (1.727 m) Comment:  Simultaneous filing. User may not have seen previous data.  Wt 64.4 kg (142 lb) Comment: Simultaneous filing. User may not have seen previous data.  SpO2 99%   BMI 21.59 kg/m   Physical Exam  Constitutional: He appears well-developed and well-nourished. No distress.  HENT:  Head: Normocephalic and atraumatic.  Nose: Nose normal.  Eyes: Conjunctivae and EOM are normal. Left eye exhibits no discharge. No scleral icterus.  Neck: Normal range of motion. Neck supple.  Cardiovascular: Normal rate, normal heart sounds and intact distal pulses. An irregularly irregular rhythm present. Exam reveals no gallop and no friction rub.  No murmur heard. Pulmonary/Chest: Effort normal and breath sounds normal. No respiratory distress.  Abdominal: Soft. Bowel sounds are normal. He exhibits no distension. There is tenderness in the right upper quadrant. There is no guarding.  Musculoskeletal: Normal range of motion. He exhibits no edema.  Neurological: He is alert. He exhibits normal muscle tone. Coordination normal.  Skin: Skin is warm and dry. No rash noted.  Psychiatric: He has a normal mood and affect.  Nursing note and vitals reviewed.    ED Treatments / Results  Labs (all labs ordered are listed, but only abnormal results are displayed) Labs Reviewed - No data to display  EKG  EKG Interpretation None       Radiology No results found.  Procedures Procedures (including critical care time)  Medications Ordered in ED Medications - No data to display   Initial Impression / Assessment and Plan / ED Course  I have reviewed the triage vital signs and the nursing notes.  Pertinent labs & imaging results that were available during my care of the patient were reviewed by me and considered in my medical decision making (see chart for details).     Patient presents to ED for evaluation of right upper quadrant abdominal pain.  He was evaluated for this 2 days ago and was found to have a  hiatal hernia on CT.  At that time lab work and imaging including HIDA scan and ultrasound were done to evaluate for gallbladder etiology.  Patient returns today because he states that he wants to know what exactly is wrong with him and he is not aware of what they told him 2 days ago.  RN spoke to family member who stated that patient has been more confused lately and the RN said that even after she talked to him a few seconds later he was unable to recognize her.  He does live by himself and states that he still drives to get from place to place.  He denies any other symptoms including his right upper quadrant abdominal pain.  He has not tried any medications at home to help with the pain. Patient found to have findings consistent with UTI on UA. Other labwork unremarkable including CBC, BMP, troponin. CT angio of abdomen and pelvis were done to rule out mesenteric ischemia and returned as negative.  Patient will need to be admitted for UTI causing altered mental status and confusion. Will speak to hospitalist for admission.  Final Clinical Impressions(s) / ED Diagnoses   Final diagnoses:  None    ED Discharge Orders    None       Delia Heady, PA-C 09/19/17 2335    Gareth Morgan, MD 09/20/17 [phone]

## 2017-09-19 NOTE — ED Notes (Signed)
Pt yelling and cursing at RN. Pt angry about being admitted and screaming at nurse.  RN went in and found pt yelling stating he couldn't get a hold of anyone. Pt had the call bell next to him.

## 2017-09-19 NOTE — ED Notes (Signed)
Pt reports he is here because he forgot what the doctor told him to do when he was here 2 days ago.

## 2017-09-19 NOTE — ED Triage Notes (Signed)
Per EMS Pt brought in by EMS from home. Pt chief complaint of RUQ pain. Started on the 20th. Pt reports to EMS that it hurts more when he takes deep breaths.  No N/V/D reported.  No excessive coughing or SOB.   Vitals: 140/78 BP HR 78 RR16 99% SPO2 on RA

## 2017-09-19 NOTE — ED Notes (Signed)
Pt ambulatory to bathroom with assistance.

## 2017-09-19 NOTE — ED Notes (Signed)
Bed: WA02 Expected date:  Expected time:  Means of arrival:  Comments: 83 yo abd pain

## 2017-09-20 ENCOUNTER — Observation Stay (HOSPITAL_COMMUNITY): Payer: Medicare Other

## 2017-09-20 ENCOUNTER — Other Ambulatory Visit: Payer: Self-pay

## 2017-09-20 DIAGNOSIS — R0781 Pleurodynia: Secondary | ICD-10-CM | POA: Diagnosis not present

## 2017-09-20 DIAGNOSIS — I10 Essential (primary) hypertension: Secondary | ICD-10-CM | POA: Diagnosis not present

## 2017-09-20 DIAGNOSIS — I4891 Unspecified atrial fibrillation: Secondary | ICD-10-CM

## 2017-09-20 LAB — I-STAT CG4 LACTIC ACID, ED: Lactic Acid, Venous: 1.55 mmol/L (ref 0.5–1.9)

## 2017-09-20 LAB — D-DIMER, QUANTITATIVE: D-Dimer, Quant: 1.09 ug/mL-FEU — ABNORMAL HIGH (ref 0.00–0.50)

## 2017-09-20 MED ORDER — POLYVINYL ALCOHOL 1.4 % OP SOLN
Freq: Every day | OPHTHALMIC | Status: DC
  Filled 2017-09-20: qty 15

## 2017-09-20 MED ORDER — PANTOPRAZOLE SODIUM 40 MG PO TBEC
40.0000 mg | DELAYED_RELEASE_TABLET | Freq: Every day | ORAL | Status: DC
  Administered 2017-09-20: 40 mg via ORAL
  Filled 2017-09-20: qty 1

## 2017-09-20 MED ORDER — ALBUTEROL (5 MG/ML) CONTINUOUS INHALATION SOLN
INHALATION_SOLUTION | RESPIRATORY_TRACT | Status: AC
  Filled 2017-09-20: qty 20

## 2017-09-20 MED ORDER — APIXABAN 2.5 MG PO TABS
2.5000 mg | ORAL_TABLET | Freq: Every day | ORAL | Status: DC
  Administered 2017-09-20: 2.5 mg via ORAL
  Filled 2017-09-20: qty 1

## 2017-09-20 MED ORDER — PANTOPRAZOLE SODIUM 40 MG PO TBEC: 40.0000 mg | DELAYED_RELEASE_TABLET | Freq: Two times a day (BID) | ORAL | 0 refills | Status: DC

## 2017-09-20 MED ORDER — PANTOPRAZOLE SODIUM 40 MG PO TBEC: 40.0000 mg | DELAYED_RELEASE_TABLET | Freq: Two times a day (BID) | ORAL | Status: DC

## 2017-09-20 NOTE — Plan of Care (Signed)
Attempts made to discuss POC with patient.  He is mainly concerned with being able to go home at this time.

## 2017-09-20 NOTE — Discharge Summary (Addendum)
Physician Discharge Summary  [name] FBP:[id number] DOB: May 10, 1922 DOA: 09/19/2017  PCP: Patient, No Pcp Per  Admit date: 09/19/2017 Discharge date: 09/20/2017  Admitted From: home Disposition:  Home  Recommendations for Outpatient Follow-up:  1. Follow up with audiology in 1 week. 2. Please obtain BMP/CBC in one week   Home Health:No Equipment/Devices:none  Discharge Condition:stable CODE STATUS:DNR Diet recommendation: Heart Healthy   Brief/Interim Summary: 83 year old with past medical history of essential hypertension, CVA, chronic atrial fibrillation on Eliquis, chronic kidney disease stage III, and a large hiatal hernia recently discharged from the hospital for right upper quadrant pain comes in for worsening right upper quadrant chest pain pleuritic in nature, he denies any nausea vomiting any fevers or cough.   Discharge Diagnoses:  Active Problems:   Atrial fibrillation with rapid ventricular response (HCC)   Hypertension   Anemia   Rib pain on right side   Chest pain, pleuritic  Pleuritic lower rib and right upper quadrant pain: A thorough workup performed last admission which turned out to be negative. EKG on this admission shows no signs for set of cardiac enzymes negative. His d-dimer is positive, but he is already on Eluquis which make PE very unlikely. His pain is probably from his large hiatal hernia which is pain which patient keeps reminding people, he was started on Protonix p.o. twice daily on admission now his pain is resolved.  Essential hypertension: No changes were made.  Chronic atrial fibrillation: Heart rate is controlled, continue Eliquis no changes made.    Discharge Instructions  Discharge Instructions    Diet - low sodium heart healthy   Complete by:  As directed    Increase activity slowly   Complete by:  As directed      Allergies as of 09/20/2017      Reactions   Codeine Nausea And Vomiting      Medication List     TAKE these medications   apixaban 2.5 MG Tabs tablet Commonly known as:  ELIQUIS Take 2.5 mg by mouth daily.   cholecalciferol 1000 units tablet Commonly known as:  VITAMIN D Take 1,000 Units by mouth daily.   omeprazole 20 MG capsule Commonly known as:  PRILOSEC Take 1 capsule (20 mg total) by mouth daily.   pantoprazole 40 MG tablet Commonly known as:  PROTONIX Take 1 tablet (40 mg total) by mouth 2 (two) times daily.   pravastatin 20 MG tablet Commonly known as:  PRAVACHOL Take 1 tablet (20 mg total) by mouth at bedtime.   RA VISION-VITE PRESERVE PO Take 1 tablet by mouth daily.   SYSTANE OP Place 1 drop into both eyes at bedtime.   vitamin B-12 1000 MCG tablet Commonly known as:  CYANOCOBALAMIN Take 1,000 mcg by mouth daily.   vitamin E 400 UNIT capsule Take 400 Units by mouth daily.       Allergies  Allergen Reactions  . Codeine Nausea And Vomiting    Consultations:None Procedures/Studies: Dg Ribs Unilateral W/chest Right  Result Date: 09/20/2017 CLINICAL DATA:  Right lower anterior rib pain.  No injury. EXAM: RIGHT RIBS AND CHEST - 3+ VIEW COMPARISON:  Chest 08/10/2017 FINDINGS: Cardiac enlargement. No vascular congestion or edema. No focal consolidation. No blunting of costophrenic angles. No pneumothorax. Calcification of the aorta. Right ribs appear intact. No acute displaced fractures or focal lesions identified. No bone destruction. Soft tissues are unremarkable. IMPRESSION: No evidence of active pulmonary disease. Aortic atherosclerosis. Negative right ribs. Electronically Signed   By: Gwyndolyn Saxon  Gerilyn Nestle M.D.   On: 09/20/2017 00:56   Nm Hepatobiliary Liver Func  Result Date: 09/17/2017 CLINICAL DATA:  Right upper quadrant pain EXAM: NUCLEAR MEDICINE HEPATOBILIARY IMAGING TECHNIQUE: Sequential images of the abdomen were obtained out to 60 minutes following intravenous administration of radiopharmaceutical. RADIOPHARMACEUTICALS:  5.3 mCi Tc-94m  Choletec  IV COMPARISON:  None. FINDINGS: Prompt uptake and biliary excretion of activity by the liver is seen. Gallbladder activity is visualized, consistent with patency of cystic duct. Biliary activity passes into small bowel, consistent with patent common bile duct. IMPRESSION: Normal study. Electronically Signed   By: Rolm Baptise M.D.   On: 09/17/2017 15:34   Ct Abdomen Pelvis W Contrast  Result Date: 09/17/2017 CLINICAL DATA:  Right upper quadrant pain with nausea and vomiting. EXAM: CT ABDOMEN AND PELVIS WITH CONTRAST TECHNIQUE: Multidetector CT imaging of the abdomen and pelvis was performed using the standard protocol following bolus administration of intravenous contrast. CONTRAST:  75 mL Isovue-300 COMPARISON:  CT abdomen pelvis 06/01/2014 Abdominal ultrasound 09/17/2017 FINDINGS: Lower chest: No pulmonary nodules or pleural effusion. No visible pericardial effusion. Hepatobiliary: Multiple subcentimeter hypodensities, most likely hepatic cyst. Normal gallbladder. Pancreas: Normal contours without ductal dilatation. No peripancreatic fluid collection. Spleen: Normal. Adrenals/Urinary Tract: --Adrenal glands: Normal. --Right kidney/ureter: No hydronephrosis or perinephric stranding. No nephrolithiasis. No obstructing ureteral stones. 1.5 cm cyst. --Left kidney/ureter: No hydronephrosis or perinephric stranding. No nephrolithiasis. No obstructing ureteral stones. 7.3 cm cyst. --Urinary bladder: Unremarkable. Stomach/Bowel: --Stomach/Duodenum: Incompletely visualized hiatal hernia. Most of the stomach is intrathoracic. --Small bowel: No dilatation or inflammation. --Colon: Rectosigmoid diverticulosis without acute inflammation. --Appendix: Not visualized. No right lower quadrant inflammation or free fluid. Vascular/Lymphatic: Atherosclerotic calcification is present within the non-aneurysmal abdominal aorta, without hemodynamically significant stenosis. No abdominal or pelvic lymphadenopathy. Reproductive:  Normal prostate and seminal vesicles. Musculoskeletal. Multilevel degenerative disc disease and facet arthrosis. No bony spinal canal stenosis. There is moderate to severe multilevel foraminal stenosis, worst at L5-S1. Moderate to severe spinal canal stenosis at L3-L4. Other: None. IMPRESSION: 1. No acute abdominopelvic abnormality. 2. Incompletely visualized large hiatal hernia with predominantly intrathoracic location of the stomach. Rectosigmoid diverticulosis without acute inflammation. 3.  Aortic Atherosclerosis (ICD10-I70.0). 4. Multilevel lumbar degenerative disc disease with moderate to severe neural foraminal stenosis and moderate to severe L3-L4 spinal canal stenosis. Electronically Signed   By: Ulyses Jarred M.D.   On: 09/17/2017 04:41   Ct Angio Abd/pel W And/or Wo Contrast  Result Date: 09/19/2017 CLINICAL DATA:  Right upper quadrant pain.  Atrial fibrillation. EXAM: CTA ABDOMEN AND PELVIS wITHOUT AND WITH CONTRAST TECHNIQUE: Multidetector CT imaging of the abdomen and pelvis was performed using the standard protocol during bolus administration of intravenous contrast. Multiplanar reconstructed images and MIPs were obtained and reviewed to evaluate the vascular anatomy. CONTRAST:  15mL ISOVUE-370 IOPAMIDOL (ISOVUE-370) INJECTION 76% COMPARISON:  CT abdomen and pelvis 09/17/2017 FINDINGS: VASCULAR Aorta: Diffuse calcific atherosclerotic change. Focal area of ulcerated plaque formation just distal to the origin of the right renal artery. No aneurysm or dissection. No intraluminal thrombus or filling defects. No significant stenosis. Celiac: Calcification at the origin of the celiac axis with possible focal stenosis but the celiac axis and branch vessels are patent. SMA: Calcification at the origin of the superior mesenteric artery with a few scattered calcifications distally. No evidence of significant stenosis or thrombus. Renals: Single renal arteries bilaterally. Calcification in the origins of  both renal arteries without significant stenosis or occlusion. Nephrograms are symmetrical. IMA: Inferior mesenteric artery is patent. No  significant stenosis, dissection, aneurysm or thrombus. Inflow: Diffuse atherosclerotic calcification. No significant stenosis, dissection, aneurysm or thrombosis. Proximal Outflow: Diffuse atherosclerotic calcification. No significant stenosis, dissection, aneurysm or thrombosis. Veins: No obvious venous abnormality. Review of the MIP images confirms the above findings. NON-VASCULAR Lower chest: Large esophageal hiatal hernia with most of the stomach above the diaphragms. Atelectasis in the lung bases. Minimal right pleural effusion. Cardiac enlargement. Coronary artery calcifications. No pericardial effusion. Hepatobiliary: Subcentimeter cysts in the liver. No change since previous study. Gallbladder and bile ducts are unremarkable. Pancreas: Unremarkable. No pancreatic ductal dilatation or surrounding inflammatory changes. Spleen: Normal in size without focal abnormality. Adrenals/Urinary Tract: No adrenal gland nodules. Bilateral renal cysts, largest on the left measuring 7.3 cm diameter. No hydronephrosis or hydroureter. Bladder wall is not thickened. No bladder filling defects. Stomach/Bowel: Intrathoracic stomach is incompletely visualized. Visualized stomach and small bowel are decompressed. Stool throughout the colon. Colonic diverticulosis without evidence of diverticulitis. Appendix is normal. Lymphatic: No significant lymphadenopathy. Scattered calcified lymph nodes in the abdomen and pelvis probably postinflammatory. Reproductive: Prostate gland is enlarged, measuring 4 cm diameter. Other: No abdominal wall hernia or abnormality. No abdominopelvic ascites. Musculoskeletal: Degenerative changes in the spine and hips. No destructive bone lesions. IMPRESSION: VASCULAR 1. Diffuse calcific atherosclerotic changes throughout. No significant stenosis or thrombus identified.  2. Focal area of ulcerated plaque formation in the aorta just distal to the right renal artery. NON-VASCULAR 1. Large esophageal hiatal hernia containing most of the stomach. 2. Minimal right pleural effusion. 3. Bilateral renal cysts. 4. Scattered hepatic cysts. 5. Enlarged prostate gland. Electronically Signed   By: Lucienne Capers M.D.   On: 09/19/2017 22:00   US Abdomen Limited Ruq  Result Date: 09/17/2017 CLINICAL DATA:  Right upper quadrant pain since yesterday EXAM: ULTRASOUND ABDOMEN LIMITED RIGHT UPPER QUADRANT COMPARISON:  CT abdomen and pelvis 06/01/2014. Ultrasound abdomen 01/23/2010 FINDINGS: Gallbladder: Limited visualization. No gallstones, sludge, or gallbladder wall thickening identified. Murphy's sign is not indicated. Common bile duct: Diameter: 3 mm, normal Liver: Increased hepatic parenchymal echotexture likely representing diffuse fatty infiltration. Portal vein is patent on color Doppler imaging with normal direction of blood flow towards the liver. IMPRESSION: No evidence of cholelithiasis or cholecystitis. Probable fatty infiltration of the liver. Electronically Signed   By: Lucienne Capers M.D.   On: 09/17/2017 02:37      Subjective: He relates his pain is resolved he feels much better tolerating his diet.  Discharge Exam: Vitals:   09/20/17 0127 09/20/17 0646  BP: 122/88 129/76  Pulse: 68 96  Resp: 16 19  Temp: 98.2 F (36.8 C) 98.1 F (36.7 C)  SpO2: 98% 99%   Vitals:   09/20/17 0030 09/20/17 0041 09/20/17 0127 09/20/17 0646  BP: (!) 121/92 (!) 121/92 122/88 129/76  Pulse: 73 75 68 96  Resp:  18 16 19   Temp:   98.2 F (36.8 C) 98.1 F (36.7 C)  TempSrc:   Oral Oral  SpO2: 97% 96% 98% 99%  Weight:      Height:        General: Pt is alert, awake, not in acute distress Cardiovascular: RRR, S1/S2 +, no rubs, no gallops Respiratory: CTA bilaterally, no wheezing, no rhonchi Abdominal: Soft, NT, ND, bowel sounds + Extremities: no edema, no  cyanosis    The results of significant diagnostics from this hospitalization (including imaging, microbiology, ancillary and laboratory) are listed below for reference.     Microbiology: No results found for this or any previous visit (from  the past 240 hour(s)).   Labs: BNP (last 3 results) No results for input(s): BNP in the last 8760 hours. Basic Metabolic Panel: Recent Labs  Lab 09/17/17 0111 09/19/17 1945  NA 138 139  K 3.9 4.1  CL 107 109  CO2 22 22  GLUCOSE 105* 101*  BUN 36* 26*  CREATININE 1.72* 1.34*  CALCIUM 9.0 8.9   Liver Function Tests: Recent Labs  Lab 09/17/17 0111 09/19/17 1945  AST 23 22  ALT 13* 12*  ALKPHOS 65 70  BILITOT 0.6 0.8  PROT 7.1 7.0  ALBUMIN 3.6 3.5   Recent Labs  Lab 09/17/17 0111 09/19/17 1945  LIPASE 28 23   No results for input(s): AMMONIA in the last 168 hours. CBC: Recent Labs  Lab 09/17/17 0111 09/17/17 0956 09/19/17 1945  WBC 8.7  --  6.4  NEUTROABS 5.2  --  3.8  HGB 12.2* 11.3* 11.8*  HCT 36.7*  --  35.4*  MCV 97.1  --  96.5  PLT 265  --  246   Cardiac Enzymes: Recent Labs  Lab 09/17/17 0413 09/17/17 0956  TROPONINI 0.03* 0.03*   BNP: Invalid input(s): POCBNP CBG: No results for input(s): GLUCAP in the last 168 hours. D-Dimer Recent Labs    09/19/17 2000  DDIMER 1.09*   Hgb A1c No results for input(s): HGBA1C in the last 72 hours. Lipid Profile No results for input(s): CHOL, HDL, LDLCALC, TRIG, CHOLHDL, LDLDIRECT in the last 72 hours. Thyroid function studies No results for input(s): TSH, T4TOTAL, T3FREE, THYROIDAB in the last 72 hours.  Invalid input(s): FREET3 Anemia work up No results for input(s): VITAMINB12, FOLATE, FERRITIN, TIBC, IRON, RETICCTPCT in the last 72 hours. Urinalysis    Component Value Date/Time   COLORURINE YELLOW 09/19/2017 2153   APPEARANCEUR CLEAR 09/19/2017 2153   LABSPEC 1.034 (H) 09/19/2017 2153   PHURINE 5.0 09/19/2017 2153   GLUCOSEU NEGATIVE 09/19/2017 2153    HGBUR LARGE (A) 09/19/2017 2153   BILIRUBINUR NEGATIVE 09/19/2017 2153   KETONESUR NEGATIVE 09/19/2017 2153   PROTEINUR NEGATIVE 09/19/2017 2153   UROBILINOGEN 0.2 11/29/2013 1619   NITRITE NEGATIVE 09/19/2017 2153   LEUKOCYTESUR LARGE (A) 09/19/2017 2153   Sepsis Labs Invalid input(s): PROCALCITONIN,  WBC,  LACTICIDVEN Microbiology No results found for this or any previous visit (from the past 240 hour(s)).   Time coordinating discharge: Over 30 minutes  SIGNED:   Charlynne Cousins, MD  [facility] 09/20/2017, 10:58 AM Pager   If 7PM-7AM, please contact night-coverage [email or web address] Password [email or web address]

## 2017-09-20 NOTE — H&P (Addendum)
History and Physical    [name] ZOX:[id number] DOB: Mar 26, 1922 DOA: 09/19/2017  PCP: Patient, No Pcp Per  Patient coming from: home  I have personally briefly reviewed patient's old medical records in [place]  Chief Complaint: right upper quadrant/rib pain  HPI: [name] is a 83 y.o. male with medical history significant of htn, cva, a-fib, anemia, ckd3, admitted here a few days ago with right upper quadrant pain, who left ama, returns with right upper quadrant pain.  Started a few days ago. Never had it before. Moderate in intensity. Worse with taking a breath. Improved with "sitting just so." has an appetite. Not associated w/ eating. No vomiting or diarrhea. No fevers or chills. No recent falls. deniex hx pe/dvt. No cough or shortness of breath. No skin changes. Denies history liver disease. No dysuria or hematuria or frequency.   ED Course: abx, labs, ct  Review of Systems: As per HPI otherwise 10 point review of systems negative.    Past Medical History:  Diagnosis Date  . Atrial fibrillation ([place])   . History of CVA (cerebrovascular accident)   . Hyperlipidemia   . Hypertension     Past Surgical History:  Procedure Laterality Date  . JOINT REPLACEMENT       reports that  has never smoked. he has never used smokeless tobacco. He reports that he does not drink alcohol or use drugs.  Allergies  Allergen Reactions  . Codeine Nausea And Vomiting    Family History  Problem Relation Age of Onset  . Cancer Mother      Prior to Admission medications   Medication Sig Start Date End Date Taking? Authorizing Provider  cholecalciferol (VITAMIN D) 1000 UNITS tablet Take 1,000 Units by mouth daily.   Yes [provider]  Multiple Vitamins-Minerals (RA VISION-VITE PRESERVE PO) Take 1 tablet by mouth daily.   Yes [provider]  Polyethyl Glycol-Propyl Glycol (SYSTANE OP) Place 1 drop into both eyes at bedtime.   Yes [provider]  vitamin B-12 (CYANOCOBALAMIN) 1000 MCG tablet Take 1,000 mcg by mouth daily.   Yes [provider]  vitamin E 400 UNIT capsule Take 400 Units by mouth daily.    Yes [provider]  apixaban (ELIQUIS) 2.5 MG TABS tablet Take 2.5 mg by mouth daily.     [provider]  omeprazole (PRILOSEC) 20 MG capsule Take 1 capsule (20 mg total) by mouth daily. 09/17/17 09/17/18  Janece Canterbury, MD  pravastatin (PRAVACHOL) 20 MG tablet Take 1 tablet (20 mg total) by mouth at bedtime. Patient not taking: Reported on 07/15/2017 12/10/13   Cathlyn Parsons, PA-C    Physical Exam: Vitals:   09/19/17 1959 09/19/17 2000 09/19/17 2208 09/19/17 2300  BP:  140/86 (!) 142/94 (!) 163/95  Pulse: (!) 115 (!) 143 83 (!) 42  Resp: 18 18 17 17   Temp:      TempSrc:      SpO2: 97% 93% 98% 97%  Weight:      Height:        Constitutional: NAD, calm, comfortable Vitals:   09/19/17 1959 09/19/17 2000 09/19/17 2208 09/19/17 2300  BP:  140/86 (!) 142/94 (!) 163/95  Pulse: (!) 115 (!) 143 83 (!) 42  Resp: 18 18 17 17   Temp:      TempSrc:      SpO2: 97% 93% 98% 97%  Weight:      Height:       Eyes:  PERRL, lids and conjunctivae normal ENMT: Mucous membranes are dry Neck: normal, supple, no masses, no thyromegaly Respiratory: clear to auscultation bilaterally, decreased breath sounds at bases Cardiovascular: Regular rate and rhythm, moderate systolic murmur, no / rubs / gallops. No extremity edema. 2+ pedal pulses. No carotid bruits.  Abdomen: mild ttp ruq otherwise no tenderness, no masses palpated. No hepatosplenomegaly. Bowel sounds positive. Non-distended, no rebound or guarding Musculoskeletal: no clubbing / cyanosis. .  Skin: no rashes, lesions, ulcers. No induration Neurologic: moving all 4 extremiees Psychiatric: alert    Labs on Admission: I have personally reviewed following labs and imaging studies  CBC: Recent Labs  Lab 09/17/17 0111 09/17/17 0956  09/19/17 1945  WBC 8.7  --  6.4  NEUTROABS 5.2  --  3.8  HGB 12.2* 11.3* 11.8*  HCT 36.7*  --  35.4*  MCV 97.1  --  96.5  PLT 265  --  960   Basic Metabolic Panel: Recent Labs  Lab 09/17/17 0111 09/19/17 1945  NA 138 139  K 3.9 4.1  CL 107 109  CO2 22 22  GLUCOSE 105* 101*  BUN 36* 26*  CREATININE 1.72* 1.34*  CALCIUM 9.0 8.9   GFR: Estimated Creatinine Clearance: 31.4 mL/min (A) (by C-G formula based on SCr of 1.34 mg/dL (H)). Liver Function Tests: Recent Labs  Lab 09/17/17 0111 09/19/17 1945  AST 23 22  ALT 13* 12*  ALKPHOS 65 70  BILITOT 0.6 0.8  PROT 7.1 7.0  ALBUMIN 3.6 3.5   Recent Labs  Lab 09/17/17 0111 09/19/17 1945  LIPASE 28 23   No results for input(s): AMMONIA in the last 168 hours. Coagulation Profile: No results for input(s): INR, PROTIME in the last 168 hours. Cardiac Enzymes: Recent Labs  Lab 09/17/17 0413 09/17/17 0956  TROPONINI 0.03* 0.03*   BNP (last 3 results) No results for input(s): PROBNP in the last 8760 hours. HbA1C: No results for input(s): HGBA1C in the last 72 hours. CBG: No results for input(s): GLUCAP in the last 168 hours. Lipid Profile: No results for input(s): CHOL, HDL, LDLCALC, TRIG, CHOLHDL, LDLDIRECT in the last 72 hours. Thyroid Function Tests: No results for input(s): TSH, T4TOTAL, FREET4, T3FREE, THYROIDAB in the last 72 hours. Anemia Panel: Recent Labs    09/17/17 0956  VITAMINB12 429  FOLATE 16.8  FERRITIN 109  TIBC 235*  IRON 73  RETICCTPCT 1.3   Urine analysis:    Component Value Date/Time   COLORURINE YELLOW 09/19/2017 2153   APPEARANCEUR CLEAR 09/19/2017 2153   LABSPEC 1.034 (H) 09/19/2017 2153   PHURINE 5.0 09/19/2017 2153   GLUCOSEU NEGATIVE 09/19/2017 2153   HGBUR LARGE (A) 09/19/2017 2153   BILIRUBINUR NEGATIVE 09/19/2017 2153   KETONESUR NEGATIVE 09/19/2017 2153   PROTEINUR NEGATIVE 09/19/2017 2153   UROBILINOGEN 0.2 11/29/2013 1619   NITRITE NEGATIVE 09/19/2017 2153    LEUKOCYTESUR LARGE (A) 09/19/2017 2153    Radiological Exams on Admission: Ct Angio Abd/pel W And/or Wo Contrast  Result Date: 09/19/2017 CLINICAL DATA:  Right upper quadrant pain.  Atrial fibrillation. EXAM: CTA ABDOMEN AND PELVIS wITHOUT AND WITH CONTRAST TECHNIQUE: Multidetector CT imaging of the abdomen and pelvis was performed using the standard protocol during bolus administration of intravenous contrast. Multiplanar reconstructed images and MIPs were obtained and reviewed to evaluate the vascular anatomy. CONTRAST:  63mL ISOVUE-370 IOPAMIDOL (ISOVUE-370) INJECTION 76% COMPARISON:  CT abdomen and pelvis 09/17/2017 FINDINGS: VASCULAR Aorta: Diffuse calcific atherosclerotic change. Focal area of ulcerated plaque formation just distal to the  origin of the right renal artery. No aneurysm or dissection. No intraluminal thrombus or filling defects. No significant stenosis. Celiac: Calcification at the origin of the celiac axis with possible focal stenosis but the celiac axis and branch vessels are patent. SMA: Calcification at the origin of the superior mesenteric artery with a few scattered calcifications distally. No evidence of significant stenosis or thrombus. Renals: Single renal arteries bilaterally. Calcification in the origins of both renal arteries without significant stenosis or occlusion. Nephrograms are symmetrical. IMA: Inferior mesenteric artery is patent. No significant stenosis, dissection, aneurysm or thrombus. Inflow: Diffuse atherosclerotic calcification. No significant stenosis, dissection, aneurysm or thrombosis. Proximal Outflow: Diffuse atherosclerotic calcification. No significant stenosis, dissection, aneurysm or thrombosis. Veins: No obvious venous abnormality. Review of the MIP images confirms the above findings. NON-VASCULAR Lower chest: Large esophageal hiatal hernia with most of the stomach above the diaphragms. Atelectasis in the lung bases. Minimal right pleural effusion.  Cardiac enlargement. Coronary artery calcifications. No pericardial effusion. Hepatobiliary: Subcentimeter cysts in the liver. No change since previous study. Gallbladder and bile ducts are unremarkable. Pancreas: Unremarkable. No pancreatic ductal dilatation or surrounding inflammatory changes. Spleen: Normal in size without focal abnormality. Adrenals/Urinary Tract: No adrenal gland nodules. Bilateral renal cysts, largest on the left measuring 7.3 cm diameter. No hydronephrosis or hydroureter. Bladder wall is not thickened. No bladder filling defects. Stomach/Bowel: Intrathoracic stomach is incompletely visualized. Visualized stomach and small bowel are decompressed. Stool throughout the colon. Colonic diverticulosis without evidence of diverticulitis. Appendix is normal. Lymphatic: No significant lymphadenopathy. Scattered calcified lymph nodes in the abdomen and pelvis probably postinflammatory. Reproductive: Prostate gland is enlarged, measuring 4 cm diameter. Other: No abdominal wall hernia or abnormality. No abdominopelvic ascites. Musculoskeletal: Degenerative changes in the spine and hips. No destructive bone lesions. IMPRESSION: VASCULAR 1. Diffuse calcific atherosclerotic changes throughout. No significant stenosis or thrombus identified. 2. Focal area of ulcerated plaque formation in the aorta just distal to the right renal artery. NON-VASCULAR 1. Large esophageal hiatal hernia containing most of the stomach. 2. Minimal right pleural effusion. 3. Bilateral renal cysts. 4. Scattered hepatic cysts. 5. Enlarged prostate gland. Electronically Signed   By: Lucienne Capers M.D.   On: 09/19/2017 22:00    Assessment/Plan Active Problems:   Atrial fibrillation with rapid ventricular response (HCC)   Hypertension   Anemia   Rib pain on right side   Chest pain, pleuritic  # Pleuritic lower rib and right upper quadrant pain - at recent hospital stay ct abdomen and ruq u/s and hida scan all negative. Had  ct angio abdomen pelvis in ed tonight with nothing to explain this pain. Exam otherwise remarkable.  - given pleuritic nature,checked d dimer, which is elevated, so will check CTA - given pleeuritic rib pain will check dedicated films of ribs to evaluate for occult fracture  # Abnormal urinalysis - ED concerned about UTI. Patient denies symptoms of UTI. Appears to have some baseline dementia but is alert. - f/u culture, holding on abx  # HTN - not on meds at home. One elevated bp here - continue to monitor  # A-fib - continue eliquis    DVT prophylaxis: eliquis, scds  Code Status: dnr, confirmed w/ patient Family Communication: friend junior campbell [phone] Disposition Plan: hopeful for home Admission status: obs   Desma Maxim MD [facility] Pager [phone]  If 7PM-7AM, please contact night-coverage [email or web address] Password [facility]  09/20/2017, 12:15 AM

## 2017-09-20 NOTE — Care Management Note (Signed)
Case Management Note  Patient Details  Name: [name] MRN: [id number] Date of Birth: [birth year]  Subjective/Objective:    afib with RVR, HTN                Action/Plan: Discharge Planning: NCM spoke to pt and lives at home alone. Faxed dc summary to his PCP's office for follow up visit by NP. States he does not have any DME at home. Pt states he drives to his appts. Picks up his meds from [facility].    PCP Lauretta Grill NP   Expected Discharge Date:  09/20/17               Expected Discharge Plan:  Home/Self Care  In-House Referral:  NA  Discharge planning Services  CM Consult  Post Acute Care Choice:  NA Choice offered to:  NA  DME Arranged:  N/A DME Agency:  NA  HH Arranged:  NA HH Agency:  NA  Status of Service:  Completed, signed off  If discussed at [place] of Stay Meetings, dates discussed:    Additional Comments:  Erenest Rasher, RN 09/20/2017, 11:17 AM

## 2017-09-21 LAB — URINE CULTURE

## 2017-09-25 LAB — CULTURE, BLOOD (ROUTINE X 2)
Culture: NO GROWTH
Culture: NO GROWTH
Special Requests: ADEQUATE
Special Requests: ADEQUATE

## 2017-12-07 ENCOUNTER — Other Ambulatory Visit: Payer: Self-pay

## 2017-12-07 ENCOUNTER — Emergency Department (HOSPITAL_COMMUNITY)
Admission: EM | Admit: 2017-12-07 | Discharge: 2017-12-07 | Disposition: A | Discharge status: Home / Self Care | Payer: Medicare Other | Attending: Emergency Medicine | Admitting: Emergency Medicine

## 2017-12-07 ENCOUNTER — Emergency Department (HOSPITAL_COMMUNITY): Payer: Medicare Other

## 2017-12-07 DIAGNOSIS — Z8673 Personal history of transient ischemic attack (TIA), and cerebral infarction without residual deficits: Secondary | ICD-10-CM | POA: Insufficient documentation

## 2017-12-07 DIAGNOSIS — I639 Cerebral infarction, unspecified: Secondary | ICD-10-CM | POA: Diagnosis not present

## 2017-12-07 DIAGNOSIS — R000 Tachycardia, unspecified: Secondary | ICD-10-CM | POA: Diagnosis not present

## 2017-12-07 DIAGNOSIS — R627 Adult failure to thrive: Secondary | ICD-10-CM | POA: Diagnosis present

## 2017-12-07 DIAGNOSIS — Z79899 Other long term (current) drug therapy: Secondary | ICD-10-CM | POA: Insufficient documentation

## 2017-12-07 DIAGNOSIS — D649 Anemia, unspecified: Secondary | ICD-10-CM | POA: Diagnosis not present

## 2017-12-07 DIAGNOSIS — I4891 Unspecified atrial fibrillation: Secondary | ICD-10-CM | POA: Diagnosis not present

## 2017-12-07 DIAGNOSIS — R4182 Altered mental status, unspecified: Secondary | ICD-10-CM | POA: Diagnosis not present

## 2017-12-07 DIAGNOSIS — Z7901 Long term (current) use of anticoagulants: Secondary | ICD-10-CM | POA: Insufficient documentation

## 2017-12-07 DIAGNOSIS — I10 Essential (primary) hypertension: Secondary | ICD-10-CM | POA: Insufficient documentation

## 2017-12-07 DIAGNOSIS — R531 Weakness: Secondary | ICD-10-CM | POA: Insufficient documentation

## 2017-12-07 DIAGNOSIS — F3010 Manic episode without psychotic symptoms, unspecified: Secondary | ICD-10-CM | POA: Diagnosis not present

## 2017-12-07 DIAGNOSIS — E860 Dehydration: Secondary | ICD-10-CM | POA: Diagnosis not present

## 2017-12-07 LAB — COMPREHENSIVE METABOLIC PANEL
ALT: 12 U/L — ABNORMAL LOW (ref 17–63)
AST: 24 U/L (ref 15–41)
Albumin: 3.8 g/dL (ref 3.5–5.0)
Alkaline Phosphatase: 66 U/L (ref 38–126)
Anion gap: 7 (ref 5–15)
BUN: 27 mg/dL — ABNORMAL HIGH (ref 6–20)
CO2: 25 mmol/L (ref 22–32)
Calcium: 9.1 mg/dL (ref 8.9–10.3)
Chloride: 108 mmol/L (ref 101–111)
Creatinine, Ser: 1.43 mg/dL — ABNORMAL HIGH (ref 0.61–1.24)
GFR calc Af Amer: 47 mL/min — ABNORMAL LOW (ref >60)
GFR calc non Af Amer: 40 mL/min — ABNORMAL LOW (ref >60)
Glucose, Bld: 97 mg/dL (ref 65–99)
Potassium: 4.1 mmol/L (ref 3.5–5.1)
Sodium: 140 mmol/L (ref 135–145)
Total Bilirubin: 0.6 mg/dL (ref 0.3–1.2)
Total Protein: 7 g/dL (ref 6.5–8.1)

## 2017-12-07 LAB — CBC WITH DIFFERENTIAL/PLATELET
Basophils Absolute: 0 10*3/uL (ref 0.0–0.1)
Basophils Relative: 0 %
Eosinophils Absolute: 0.1 10*3/uL (ref 0.0–0.7)
Eosinophils Relative: 1 %
HCT: 38.3 % — ABNORMAL LOW (ref 39.0–52.0)
Hemoglobin: 12.6 g/dL — ABNORMAL LOW (ref 13.0–17.0)
Lymphocytes Relative: 26 %
Lymphs Abs: 1.8 10*3/uL (ref 0.7–4.0)
MCH: 32.1 pg (ref 26.0–34.0)
MCHC: 32.9 g/dL (ref 30.0–36.0)
MCV: 97.5 fL (ref 78.0–100.0)
Monocytes Absolute: 0.4 10*3/uL (ref 0.1–1.0)
Monocytes Relative: 6 %
Neutro Abs: 4.7 10*3/uL (ref 1.7–7.7)
Neutrophils Relative %: 67 %
Platelets: 258 10*3/uL (ref 150–400)
RBC: 3.93 MIL/uL — ABNORMAL LOW (ref 4.22–5.81)
RDW: 13.2 % (ref 11.5–15.5)
WBC: 6.9 10*3/uL (ref 4.0–10.5)

## 2017-12-07 LAB — URINALYSIS, ROUTINE W REFLEX MICROSCOPIC
Bilirubin Urine: NEGATIVE
Glucose, UA: NEGATIVE mg/dL
Hgb urine dipstick: NEGATIVE
Ketones, ur: 5 mg/dL — AB
Nitrite: NEGATIVE
Protein, ur: NEGATIVE mg/dL
Specific Gravity, Urine: 1.019 (ref 1.005–1.030)
Squamous Epithelial / LPF: NONE SEEN
pH: 5 (ref 5.0–8.0)

## 2017-12-07 LAB — CBG MONITORING, ED: Glucose-Capillary: 93 mg/dL (ref 65–99)

## 2017-12-07 MED ORDER — SODIUM CHLORIDE 0.9 % IV SOLN
INTRAVENOUS | Status: DC
  Administered 2017-12-07: 19:00:00 via INTRAVENOUS

## 2017-12-07 NOTE — ED Triage Notes (Signed)
Per EMS, patient comes from home. Alert and oriented x4, patient states he feels "off". No complaints, no cp, no n/v/d, no pain. Hx afib. Pt tearful. Possible failure to thrive.

## 2017-12-07 NOTE — ED Provider Notes (Signed)
[facility] DEPT Provider Note   CSN: [id number] Arrival date & time: 12/07/17  1738     History   Chief Complaint Chief Complaint  Patient presents with  . Failure To Thrive    HPI [name] is a 84 y.o. male.  Pt presents to the ED today b/c he does not feel right.  He says that he feels "off."  He lives at home with just his puppy.  He does not have any pain.  He has been eating and drinking ok.  No f/c.  He is on Eliquis for a.fib.  He denies any recent falls.  CHA2DS2/VAS Stroke Risk Points      5 >= 2 Points: High Risk  1 - 1.99 Points: Medium Risk  0 Points: Low Risk    The previous score was 4 on 12/17/2016.:  Change:     Details    This score determines the patient's risk of having a stroke if the  patient has atrial fibrillation.       Points Metrics  0 Has Congestive Heart Failure:  No   0 Has Vascular Disease:  No   1 Has Hypertension:  Yes   2 Age:  54   0 Has Diabetes:  No   2 Had Stroke:  Yes  Had TIA:  No  Had thromboembolism:  No   0 Male:  No                Past Medical History:  Diagnosis Date  . Atrial fibrillation ([place])   . History of CVA (cerebrovascular accident)   . Hyperlipidemia   . Hypertension     Patient Active Problem List   Diagnosis Date Noted  . Rib pain on right side 09/20/2017  . Chest pain, pleuritic 09/20/2017  . Abdominal pain 09/17/2017  . Hyperlipidemia 09/17/2017  . Hypertension 09/17/2017  . Anemia 09/17/2017  . Atrial fibrillation with rapid ventricular response ([place]) 11/29/2013  . CVA (cerebral infarction) 11/29/2013  . OTHER VITAMIN B12 DEFICIENCY ANEMIA 01/08/2008  . ANAL FISTULA 01/08/2008    Past Surgical History:  Procedure Laterality Date  . JOINT REPLACEMENT         Home Medications    Prior to Admission medications   Medication Sig Start Date End Date Taking? Authorizing Provider  apixaban (ELIQUIS) 2.5 MG TABS tablet Take 2.5 mg by mouth daily.      [provider]  cholecalciferol (VITAMIN D) 1000 UNITS tablet Take 1,000 Units by mouth daily.    [provider]  Multiple Vitamins-Minerals (RA VISION-VITE PRESERVE PO) Take 1 tablet by mouth daily.    [provider]  omeprazole (PRILOSEC) 20 MG capsule Take 1 capsule (20 mg total) by mouth daily. 09/17/17 09/17/18  Janece Canterbury, MD  pantoprazole (PROTONIX) 40 MG tablet Take 1 tablet (40 mg total) by mouth 2 (two) times daily. 09/20/17   Charlynne Cousins, MD  Polyethyl Glycol-Propyl Glycol (SYSTANE OP) Place 1 drop into both eyes at bedtime.    [provider]  pravastatin (PRAVACHOL) 20 MG tablet Take 1 tablet (20 mg total) by mouth at bedtime. Patient not taking: Reported on 07/15/2017 12/10/13   Cathlyn Parsons, PA-C  vitamin B-12 (CYANOCOBALAMIN) 1000 MCG tablet Take 1,000 mcg by mouth daily.    [provider]  vitamin E 400 UNIT capsule Take 400 Units by mouth daily.     [provider]    Family History Family History  Problem  Relation Age of Onset  . Cancer Mother     Social History Social History   Tobacco Use  . Smoking status: Never Smoker  . Smokeless tobacco: Never Used  Substance Use Topics  . Alcohol use: No  . Drug use: No     Allergies   Codeine   Review of Systems Review of Systems  Neurological: Positive for weakness.  All other systems reviewed and are negative.    Physical Exam Updated Vital Signs BP 137/71   Pulse 90   Temp (!) 97.3 F (36.3 C) (Axillary)   Resp (!) 23   Ht 5\' 6"  (1.676 m)   Wt 59 kg (130 lb)   SpO2 99%   BMI 20.98 kg/m   Physical Exam  Constitutional: He is oriented to person, place, and time. He appears well-developed and well-nourished.  HENT:  Head: Normocephalic and atraumatic.  Right Ear: External ear normal.  Left Ear: External ear normal.  Nose: Nose normal.  Mouth/Throat: Oropharynx is clear and moist.  Eyes: Conjunctivae and EOM are  normal. Pupils are equal, round, and reactive to light.  Neck: Normal range of motion. Neck supple.  Cardiovascular: Normal heart sounds and intact distal pulses. An irregularly irregular rhythm present. Tachycardia present.  Pulmonary/Chest: Effort normal and breath sounds normal.  Abdominal: Soft. Bowel sounds are normal.  Musculoskeletal: Normal range of motion.  Neurological: He is alert and oriented to person, place, and time.  Skin: Skin is warm. Capillary refill takes less than 2 seconds.  Psychiatric: He has a normal mood and affect. His behavior is normal. Judgment and thought content normal.  Nursing note and vitals reviewed.    ED Treatments / Results  Labs (all labs ordered are listed, but only abnormal results are displayed) Labs Reviewed  COMPREHENSIVE METABOLIC PANEL - Abnormal; Notable for the following components:      Result Value   BUN 27 (*)    Creatinine, Ser 1.43 (*)    ALT 12 (*)    GFR calc non Af Amer 40 (*)    GFR calc Af Amer 47 (*)    All other components within normal limits  CBC WITH DIFFERENTIAL/PLATELET - Abnormal; Notable for the following components:   RBC 3.93 (*)    Hemoglobin 12.6 (*)    HCT 38.3 (*)    All other components within normal limits  URINALYSIS, ROUTINE W REFLEX MICROSCOPIC - Abnormal; Notable for the following components:   Ketones, ur 5 (*)    Leukocytes, UA SMALL (*)    Bacteria, UA RARE (*)    All other components within normal limits  CBG MONITORING, ED    EKG  EKG Interpretation  Date/Time:  Sunday December 07 2017 20:24:28 EST Ventricular Rate:  103 PR Interval:    QRS Duration: 97 QT Interval:  395 QTC Calculation: 518 R Axis:   102 Text Interpretation:  Atrial fibrillation Right axis deviation Prolonged QT interval Artifact in lead(s) I III aVR aVL aVF V2 No significant change since last tracing Confirmed by Isla Pence [phone]) on 12/07/2017 8:36:49 PM       Radiology Dg Chest 2 View  Result Date:  12/07/2017 CLINICAL DATA:  Per order- AMSAlert and oriented x4, patient states he feels "off". No complaints, no cp, no n/v/d, no pain. PT HX: HTN, non smoker EXAM: CHEST  2 VIEW COMPARISON:  09/20/2017 FINDINGS: Cardiac silhouette is normal in size. No mediastinal or hilar masses. No evidence of adenopathy. Moderate size hiatal hernia. Lungs  are clear.  No convincing pleural effusion.  No pneumothorax. Skeletal structures are demineralized but grossly intact. IMPRESSION: No acute cardiopulmonary disease. Electronically Signed   By: Lajean Manes M.D.   On: 12/07/2017 18:31   Ct Head Wo Contrast  Result Date: 12/07/2017 CLINICAL DATA:  patient comes from home. Alert and oriented x4, patient states he feels "off". No complaints, no cp, no n/v/d, no pain. Hx afib. Pt tearful. Possible failure to thrive. EXAM: CT HEAD WITHOUT CONTRAST TECHNIQUE: Contiguous axial images were obtained from the base of the skull through the vertex without intravenous contrast. COMPARISON:  08/10/2017 FINDINGS: Brain: No evidence of acute infarction, hemorrhage, hydrocephalus, extra-axial collection or mass lesion/mass effect. Stable encephalomalacia is noted involving the lateral posterior right frontal lobe adjacent anterior inferior parietal lobe and right temporal lobe, consistent with an old infarct. There is age appropriate ventricular and sulcal enlargement. Patchy white matter hypoattenuation is noted elsewhere consistent with chronic microvascular ischemic change, also stable. Vascular: No hyperdense vessel or unexpected calcification. Skull: Normal. Negative for fracture or focal lesion. Sinuses/Orbits: Globes and orbits are unremarkable. Sinuses are clear. Other: None. IMPRESSION: 1. No acute intracranial abnormalities. 2. Old right MCA distribution infarct. 3. Age-appropriate volume loss. Mild to moderate chronic microvascular ischemic change. Electronically Signed   By: Lajean Manes M.D.   On: 12/07/2017 18:43     Procedures Procedures (including critical care time)  Medications Ordered in ED Medications  0.9 %  sodium chloride infusion ( Intravenous New Bag/Given 12/07/17 1853)     Initial Impression / Assessment and Plan / ED Course  I have reviewed the triage vital signs and the nursing notes.  Pertinent labs & imaging results that were available during my care of the patient were reviewed by me and considered in my medical decision making (see chart for details).    Pt's work up negative.  He looks good.  He is encouraged to f/u with pcp.  Return if worse.  Final Clinical Impressions(s) / ED Diagnoses   Final diagnoses:  Dehydration    ED Discharge Orders    None       Isla Pence, MD 12/07/17 2201

## 2017-12-07 NOTE — ED Notes (Signed)
Patient sleeping soundly, in NAD.

## 2017-12-07 NOTE — ED Notes (Signed)
Pt ambulated in the hall with steady gait without assistance.

## 2017-12-07 NOTE — ED Notes (Signed)
Bed: QP59 Expected date:  Expected time:  Means of arrival:  Comments: 84 yo FTT

## 2017-12-07 NOTE — ED Notes (Signed)
[facility] called for transport.  

## 2017-12-11 DIAGNOSIS — I359 Nonrheumatic aortic valve disorder, unspecified: Secondary | ICD-10-CM | POA: Diagnosis not present

## 2017-12-11 DIAGNOSIS — I480 Paroxysmal atrial fibrillation: Secondary | ICD-10-CM | POA: Diagnosis not present

## 2018-01-28 DIAGNOSIS — R55 Syncope and collapse: Secondary | ICD-10-CM | POA: Diagnosis not present

## 2018-01-28 DIAGNOSIS — I480 Paroxysmal atrial fibrillation: Secondary | ICD-10-CM | POA: Diagnosis not present

## 2018-01-28 DIAGNOSIS — I359 Nonrheumatic aortic valve disorder, unspecified: Secondary | ICD-10-CM | POA: Diagnosis not present

## 2018-02-16 ENCOUNTER — Observation Stay (HOSPITAL_COMMUNITY)
Admission: EM | Admit: 2018-02-16 | Discharge: 2018-02-18 | Disposition: A | Discharge status: Home / Self Care | Payer: Medicare Other | Attending: Internal Medicine | Admitting: Internal Medicine

## 2018-02-16 ENCOUNTER — Encounter (HOSPITAL_COMMUNITY): Payer: Self-pay | Admitting: Emergency Medicine

## 2018-02-16 ENCOUNTER — Other Ambulatory Visit: Payer: Self-pay

## 2018-02-16 ENCOUNTER — Emergency Department (HOSPITAL_COMMUNITY): Payer: Medicare Other

## 2018-02-16 DIAGNOSIS — N183 Chronic kidney disease, stage 3 unspecified: Secondary | ICD-10-CM | POA: Diagnosis present

## 2018-02-16 DIAGNOSIS — Z7901 Long term (current) use of anticoagulants: Secondary | ICD-10-CM | POA: Insufficient documentation

## 2018-02-16 DIAGNOSIS — E785 Hyperlipidemia, unspecified: Secondary | ICD-10-CM | POA: Insufficient documentation

## 2018-02-16 DIAGNOSIS — Z66 Do not resuscitate: Secondary | ICD-10-CM | POA: Insufficient documentation

## 2018-02-16 DIAGNOSIS — I129 Hypertensive chronic kidney disease with stage 1 through stage 4 chronic kidney disease, or unspecified chronic kidney disease: Secondary | ICD-10-CM | POA: Insufficient documentation

## 2018-02-16 DIAGNOSIS — Z8673 Personal history of transient ischemic attack (TIA), and cerebral infarction without residual deficits: Secondary | ICD-10-CM | POA: Insufficient documentation

## 2018-02-16 DIAGNOSIS — R918 Other nonspecific abnormal finding of lung field: Secondary | ICD-10-CM | POA: Diagnosis not present

## 2018-02-16 DIAGNOSIS — I482 Chronic atrial fibrillation, unspecified: Secondary | ICD-10-CM | POA: Diagnosis present

## 2018-02-16 DIAGNOSIS — R404 Transient alteration of awareness: Secondary | ICD-10-CM | POA: Diagnosis not present

## 2018-02-16 DIAGNOSIS — R000 Tachycardia, unspecified: Secondary | ICD-10-CM | POA: Diagnosis not present

## 2018-02-16 DIAGNOSIS — Z79899 Other long term (current) drug therapy: Secondary | ICD-10-CM | POA: Insufficient documentation

## 2018-02-16 DIAGNOSIS — R531 Weakness: Secondary | ICD-10-CM | POA: Diagnosis not present

## 2018-02-16 DIAGNOSIS — J189 Pneumonia, unspecified organism: Principal | ICD-10-CM | POA: Diagnosis present

## 2018-02-16 DIAGNOSIS — R42 Dizziness and giddiness: Secondary | ICD-10-CM | POA: Diagnosis not present

## 2018-02-16 HISTORY — DX: Chronic atrial fibrillation, unspecified: I48.20

## 2018-02-16 HISTORY — DX: Pneumonia, unspecified organism: J18.9

## 2018-02-16 HISTORY — DX: Chronic kidney disease, stage 3 unspecified: N18.30

## 2018-02-16 HISTORY — DX: Chronic kidney disease, stage 3 (moderate): N18.3

## 2018-02-16 HISTORY — DX: Cerebral infarction, unspecified: I63.9

## 2018-02-16 HISTORY — DX: Personal history of other diseases of the digestive system: Z87.19

## 2018-02-16 LAB — COMPREHENSIVE METABOLIC PANEL
ALT: 10 U/L — ABNORMAL LOW (ref 17–63)
AST: 19 U/L (ref 15–41)
Albumin: 3.2 g/dL — ABNORMAL LOW (ref 3.5–5.0)
Alkaline Phosphatase: 74 U/L (ref 38–126)
Anion gap: 10 (ref 5–15)
BUN: 31 mg/dL — ABNORMAL HIGH (ref 6–20)
CO2: 23 mmol/L (ref 22–32)
Calcium: 9 mg/dL (ref 8.9–10.3)
Chloride: 106 mmol/L (ref 101–111)
Creatinine, Ser: 1.4 mg/dL — ABNORMAL HIGH (ref 0.61–1.24)
GFR calc Af Amer: 48 mL/min — ABNORMAL LOW (ref >60)
GFR calc non Af Amer: 41 mL/min — ABNORMAL LOW (ref >60)
Glucose, Bld: 92 mg/dL (ref 65–99)
Potassium: 4.3 mmol/L (ref 3.5–5.1)
Sodium: 139 mmol/L (ref 135–145)
Total Bilirubin: 0.6 mg/dL (ref 0.3–1.2)
Total Protein: 6.3 g/dL — ABNORMAL LOW (ref 6.5–8.1)

## 2018-02-16 LAB — CBC WITH DIFFERENTIAL/PLATELET
Basophils Absolute: 0 10*3/uL (ref 0.0–0.1)
Basophils Relative: 0 %
Eosinophils Absolute: 0.2 10*3/uL (ref 0.0–0.7)
Eosinophils Relative: 2 %
HCT: 36.9 % — ABNORMAL LOW (ref 39.0–52.0)
Hemoglobin: 11.9 g/dL — ABNORMAL LOW (ref 13.0–17.0)
Lymphocytes Relative: 23 %
Lymphs Abs: 1.7 10*3/uL (ref 0.7–4.0)
MCH: 30.8 pg (ref 26.0–34.0)
MCHC: 32.2 g/dL (ref 30.0–36.0)
MCV: 95.6 fL (ref 78.0–100.0)
Monocytes Absolute: 0.6 10*3/uL (ref 0.1–1.0)
Monocytes Relative: 9 %
Neutro Abs: 4.9 10*3/uL (ref 1.7–7.7)
Neutrophils Relative %: 66 %
Platelets: 233 10*3/uL (ref 150–400)
RBC: 3.86 MIL/uL — ABNORMAL LOW (ref 4.22–5.81)
RDW: 12.8 % (ref 11.5–15.5)
WBC: 7.3 10*3/uL (ref 4.0–10.5)

## 2018-02-16 LAB — TROPONIN I: Troponin I: <0.03 ng/mL (ref <0.03)

## 2018-02-16 NOTE — ED Triage Notes (Addendum)
Pt comes from home where he lives with his wife. A & O x 4 at baseline. In Afib and takes Eliquis for same. Complaints started today of "wooziness". No fall. No syncope. No injury.  No stroke symptoms per EMS. No other complaints.  No pain anywhere.  Dizziness has since resolved and pt has no complaints.

## 2018-02-16 NOTE — ED Provider Notes (Signed)
[facility] EMERGENCY DEPARTMENT Provider Note   CSN: [id number] Arrival date & time: 02/16/18  2041     History   Chief Complaint Chief Complaint  Patient presents with  . Dizziness    HPI [name] is a 84 y.o. male with a history of atrial fibrillation on Eliquis, ischemic CVA, hypertension, and hyperlipidemia who arrives to the ED via EMS for lightheadedness earlier today which has resolved at present.  Patient states that he was going to get into the shower when he felt "woozy" did not feel as if he could take a shower safely.  He further describes this as feeling lightheaded and generally weak.  He is unable to give me a timeframe for how long this lasted.  He denies any syncopal episode.  He states he has no complaints at this time and feels at baseline.  No specific alleviating or aggravating factors to his symptoms earlier today.  Denies numbness, weakness, change in vision, dizziness like the room spinning, syncope, chest pain, or dyspnea.  HPI  Past Medical History:  Diagnosis Date  . Atrial fibrillation ([place])   . History of CVA (cerebrovascular accident)   . Hyperlipidemia   . Hypertension     Patient Active Problem List   Diagnosis Date Noted  . Rib pain on right side 09/20/2017  . Chest pain, pleuritic 09/20/2017  . Abdominal pain 09/17/2017  . Hyperlipidemia 09/17/2017  . Hypertension 09/17/2017  . Anemia 09/17/2017  . Atrial fibrillation with rapid ventricular response ([place]) 11/29/2013  . CVA (cerebral infarction) 11/29/2013  . OTHER VITAMIN B12 DEFICIENCY ANEMIA 01/08/2008  . ANAL FISTULA 01/08/2008    Past Surgical History:  Procedure Laterality Date  . JOINT REPLACEMENT          Home Medications    Prior to Admission medications   Medication Sig Start Date End Date Taking? Authorizing Provider  apixaban (ELIQUIS) 2.5 MG TABS tablet Take 2.5 mg by mouth daily.     [provider]  cholecalciferol (VITAMIN D)  1000 UNITS tablet Take 1,000 Units by mouth daily.    [provider]  Multiple Vitamins-Minerals (RA VISION-VITE PRESERVE PO) Take 1 tablet by mouth daily.    [provider]  omeprazole (PRILOSEC) 20 MG capsule Take 1 capsule (20 mg total) by mouth daily. 09/17/17 09/17/18  Janece Canterbury, MD  pantoprazole (PROTONIX) 40 MG tablet Take 1 tablet (40 mg total) by mouth 2 (two) times daily. 09/20/17   Charlynne Cousins, MD  Polyethyl Glycol-Propyl Glycol (SYSTANE OP) Place 1 drop into both eyes at bedtime.    [provider]  pravastatin (PRAVACHOL) 20 MG tablet Take 1 tablet (20 mg total) by mouth at bedtime. Patient not taking: Reported on 07/15/2017 12/10/13   Cathlyn Parsons, PA-C  vitamin B-12 (CYANOCOBALAMIN) 1000 MCG tablet Take 1,000 mcg by mouth daily.    [provider]  vitamin E 400 UNIT capsule Take 400 Units by mouth daily.     [provider]    Family History Family History  Problem Relation Age of Onset  . Cancer Mother     Social History Social History   Tobacco Use  . Smoking status: Never Smoker  . Smokeless tobacco: Never Used  Substance Use Topics  . Alcohol use: No  . Drug use: No     Allergies   Codeine   Review of Systems Review of Systems  Constitutional: Negative for chills and fever.  Eyes: Negative for visual  disturbance.  Respiratory: Negative for shortness of breath.   Cardiovascular: Negative for chest pain.  Gastrointestinal: Negative for abdominal pain, blood in stool, constipation, diarrhea, nausea and vomiting.  Neurological: Positive for weakness (Generalized, resolved at present) and light-headedness (Resolved at present.). Negative for dizziness, syncope, speech difficulty, numbness and headaches.  All other systems reviewed and are negative.   Physical Exam Updated Vital Signs BP 104/62 (BP Location: Right Arm)   Pulse 62   Temp 97.8 F (36.6 C) (Oral)   Resp 18   SpO2 100%    Physical Exam  Constitutional: He appears well-developed and well-nourished.  Non-toxic appearance. No distress.  HENT:  Head: Normocephalic and atraumatic.  Right Ear: No hemotympanum.  Left Ear: No hemotympanum.  Nose: Nose normal.  Mouth/Throat: Uvula is midline and oropharynx is clear and moist.  Eyes: Pupils are equal, round, and reactive to light. Conjunctivae and EOM are normal. Right eye exhibits no discharge. Left eye exhibits no discharge.  Cardiovascular: Normal rate. An irregularly irregular rhythm present.  No murmur heard. Pulses:      Radial pulses are 2+ on the right side, and 2+ on the left side.  Pulmonary/Chest: No respiratory distress. He has decreased breath sounds (left sided). He has no wheezes.  Abdominal: Soft. He exhibits no distension. There is no tenderness. There is no rigidity and no guarding.  Neurological: He is alert.  Clear speech. Oriented x 3.  CN III-XII grossly intact.  Negative pronator drift.  Patient has 5 out of 5 symmetric grip strength.  5 out of 5 strength plantar dorsiflexion bilaterally.  Sensation grossly intact bilateral upper and lower extremities. Gait intact/steady.   Skin: Skin is warm and dry. No rash noted.  Psychiatric: He has a normal mood and affect. His behavior is normal.  Nursing note and vitals reviewed.  ED Treatments / Results  Labs Results for orders placed or performed during the hospital encounter of 02/16/18  CBC with Differential  Result Value Ref Range   WBC 7.3 4.0 - 10.5 K/uL   RBC 3.86 (L) 4.22 - 5.81 MIL/uL   Hemoglobin 11.9 (L) 13.0 - 17.0 g/dL   HCT 36.9 (L) 39.0 - 52.0 %   MCV 95.6 78.0 - 100.0 fL   MCH 30.8 26.0 - 34.0 pg   MCHC 32.2 30.0 - 36.0 g/dL   RDW 12.8 11.5 - 15.5 %   Platelets 233 150 - 400 K/uL   Neutrophils Relative % 66 %   Neutro Abs 4.9 1.7 - 7.7 K/uL   Lymphocytes Relative 23 %   Lymphs Abs 1.7 0.7 - 4.0 K/uL   Monocytes Relative 9 %   Monocytes Absolute 0.6 0.1 - 1.0 K/uL    Eosinophils Relative 2 %   Eosinophils Absolute 0.2 0.0 - 0.7 K/uL   Basophils Relative 0 %   Basophils Absolute 0.0 0.0 - 0.1 K/uL  Comprehensive metabolic panel  Result Value Ref Range   Sodium 139 135 - 145 mmol/L   Potassium 4.3 3.5 - 5.1 mmol/L   Chloride 106 101 - 111 mmol/L   CO2 23 22 - 32 mmol/L   Glucose, Bld 92 65 - 99 mg/dL   BUN 31 (H) 6 - 20 mg/dL   Creatinine, Ser 1.40 (H) 0.61 - 1.24 mg/dL   Calcium 9.0 8.9 - 10.3 mg/dL   Total Protein 6.3 (L) 6.5 - 8.1 g/dL   Albumin 3.2 (L) 3.5 - 5.0 g/dL   AST 19 15 - 41 U/L  ALT 10 (L) 17 - 63 U/L   Alkaline Phosphatase 74 38 - 126 U/L   Total Bilirubin 0.6 0.3 - 1.2 mg/dL   GFR calc non Af Amer 41 (L) >60 mL/min   GFR calc Af Amer 48 (L) >60 mL/min   Anion gap 10 5 - 15  Troponin I  Result Value Ref Range   Troponin I <0.03 <0.03 ng/mL   EKG EKG Interpretation  Date/Time:  Monday February 16 2018 21:12:01 EDT Ventricular Rate:  86 PR Interval:    QRS Duration: 96 QT Interval:  376 QTC Calculation: 450 R Axis:   82 Text Interpretation:  Atrial fibrillation Borderline right axis deviation Probable left ventricular hypertrophy No significant change since last tracing Confirmed by Duffy Bruce [phone]) on 02/16/2018 11:53:57 PM   Radiology Dg Chest 2 View  Result Date: 02/16/2018 CLINICAL DATA:  Acute onset of generalized weakness. EXAM: CHEST - 2 VIEW COMPARISON:  Chest radiograph performed 12/07/2016 FINDINGS: The lungs are well-aerated. Mild left midlung airspace opacity may reflect pneumonia. There is no evidence of pleural effusion or pneumothorax. The heart is normal in size; the mediastinal contour is within normal limits. No acute osseous abnormalities are seen. IMPRESSION: Mild left midlung airspace opacity may reflect pneumonia. Electronically Signed   By: Garald Balding M.D.   On: 02/16/2018 22:13    Procedures Procedures (including critical care time)  Medications Ordered in ED Medications - No data to  display   Initial Impression / Assessment and Plan / ED Course  I have reviewed the triage vital signs and the nursing notes.  Pertinent labs & imaging results that were available during my care of the patient were reviewed by me and considered in my medical decision making (see chart for details).   Patient presents with lightheadedness/generalized weakness earlier today that is resolved at present. He has no complaints upon my evaluation and is requesting to go home. His vitals are notable for intermittent bradycardia. Exam with some decreased breath sounds on the left. Will evaluate with CT head, CXR, basic labs, and troponin.   Patient refused CT Head, no focal neurologic deficits, patient without complaints of sxs at this time, low suspicion for CVA/SAH/ICH at this time. EKG without significant change from previous, troponin WNL, patient without chest pain/dyspnea, low suspicion for ACS at this time. His labs appear at baseline including hemoglobin of 11.9 and creatinine of 1.40. Work-up notable for CXR findings consistent with possible pneumonia- patient with some decreased breath sounds in this area, he denies dyspnea, chest pain, or fevers. Will check ambulatory pulse ox.   Patient ambulatory with SpO2 dropping into the 80s, HR into the 140s. Will start abx in the ED with Ceftriaxone and Azithromycin and plan for admission.   Findings and plan of care discussed with supervising physician Dr. Ellender Hose who personally evaluated and examined this patient and is in agreement with plan.   I placed page out to the hospitalist service, I did not speak with anyone directly, however Dr. Myna Hidalgo has seen and admitted the patient.   Final Clinical Impressions(s) / ED Diagnoses   Final diagnoses:  Community acquired pneumonia of left lung, unspecified part of lung    ED Discharge Orders    None       Amaryllis Dyke, PA-C 02/17/18 Coralie Carpen, MD 02/17/18 1427

## 2018-02-17 ENCOUNTER — Other Ambulatory Visit: Payer: Self-pay

## 2018-02-17 ENCOUNTER — Encounter (HOSPITAL_COMMUNITY): Payer: Self-pay | Admitting: Family Medicine

## 2018-02-17 DIAGNOSIS — J181 Lobar pneumonia, unspecified organism: Secondary | ICD-10-CM | POA: Diagnosis not present

## 2018-02-17 DIAGNOSIS — J189 Pneumonia, unspecified organism: Secondary | ICD-10-CM

## 2018-02-17 DIAGNOSIS — N183 Chronic kidney disease, stage 3 unspecified: Secondary | ICD-10-CM | POA: Diagnosis present

## 2018-02-17 DIAGNOSIS — I482 Chronic atrial fibrillation: Secondary | ICD-10-CM | POA: Diagnosis not present

## 2018-02-17 LAB — CBC WITH DIFFERENTIAL/PLATELET
Basophils Absolute: 0 10*3/uL (ref 0.0–0.1)
Basophils Relative: 0 %
Eosinophils Absolute: 0.2 10*3/uL (ref 0.0–0.7)
Eosinophils Relative: 2 %
HCT: 36.8 % — ABNORMAL LOW (ref 39.0–52.0)
Hemoglobin: 12.1 g/dL — ABNORMAL LOW (ref 13.0–17.0)
Lymphocytes Relative: 27 %
Lymphs Abs: 2 10*3/uL (ref 0.7–4.0)
MCH: 31.5 pg (ref 26.0–34.0)
MCHC: 32.9 g/dL (ref 30.0–36.0)
MCV: 95.8 fL (ref 78.0–100.0)
Monocytes Absolute: 0.7 10*3/uL (ref 0.1–1.0)
Monocytes Relative: 10 %
Neutro Abs: 4.5 10*3/uL (ref 1.7–7.7)
Neutrophils Relative %: 61 %
Platelets: 229 10*3/uL (ref 150–400)
RBC: 3.84 MIL/uL — ABNORMAL LOW (ref 4.22–5.81)
RDW: 12.9 % (ref 11.5–15.5)
WBC: 7.4 10*3/uL (ref 4.0–10.5)

## 2018-02-17 LAB — BASIC METABOLIC PANEL
Anion gap: 10 (ref 5–15)
BUN: 30 mg/dL — ABNORMAL HIGH (ref 6–20)
CO2: 23 mmol/L (ref 22–32)
Calcium: 8.9 mg/dL (ref 8.9–10.3)
Chloride: 107 mmol/L (ref 101–111)
Creatinine, Ser: 1.38 mg/dL — ABNORMAL HIGH (ref 0.61–1.24)
GFR calc Af Amer: 49 mL/min — ABNORMAL LOW (ref >60)
GFR calc non Af Amer: 42 mL/min — ABNORMAL LOW (ref >60)
Glucose, Bld: 89 mg/dL (ref 65–99)
Potassium: 4 mmol/L (ref 3.5–5.1)
Sodium: 140 mmol/L (ref 135–145)

## 2018-02-17 LAB — STREP PNEUMONIAE URINARY ANTIGEN: Strep Pneumo Urinary Antigen: NEGATIVE

## 2018-02-17 MED ORDER — SODIUM CHLORIDE 0.9 % IV SOLN
INTRAVENOUS | Status: DC
  Administered 2018-02-17: 02:00:00 via INTRAVENOUS

## 2018-02-17 MED ORDER — DOXYCYCLINE HYCLATE 100 MG PO TABS: 100.0000 mg | ORAL_TABLET | Freq: Two times a day (BID) | ORAL | 0 refills | Status: DC

## 2018-02-17 MED ORDER — ACETAMINOPHEN 650 MG RE SUPP: 650.0000 mg | Freq: Four times a day (QID) | RECTAL | Status: DC | PRN

## 2018-02-17 MED ORDER — APIXABAN 2.5 MG PO TABS: 2.5000 mg | ORAL_TABLET | Freq: Every day | ORAL | Status: DC

## 2018-02-17 MED ORDER — SENNOSIDES-DOCUSATE SODIUM 8.6-50 MG PO TABS: 1.0000 | ORAL_TABLET | Freq: Every evening | ORAL | Status: DC | PRN

## 2018-02-17 MED ORDER — VITAMIN B-12 1000 MCG PO TABS
1000.0000 ug | ORAL_TABLET | Freq: Every day | ORAL | Status: DC
Start: 2018-02-17 — End: 2018-02-18
  Administered 2018-02-17 – 2018-02-18 (×2): 1000 ug via ORAL
  Filled 2018-02-17 (×2): qty 1

## 2018-02-17 MED ORDER — SODIUM CHLORIDE 0.9 % IV SOLN: 1.0000 g | INTRAVENOUS | Status: DC

## 2018-02-17 MED ORDER — DOXYCYCLINE HYCLATE 100 MG PO TABS
100.0000 mg | ORAL_TABLET | Freq: Two times a day (BID) | ORAL | Status: DC
  Administered 2018-02-17 – 2018-02-18 (×3): 100 mg via ORAL
  Filled 2018-02-17 (×3): qty 1

## 2018-02-17 MED ORDER — PANTOPRAZOLE SODIUM 40 MG PO TBEC
40.0000 mg | DELAYED_RELEASE_TABLET | Freq: Two times a day (BID) | ORAL | Status: DC
  Administered 2018-02-17 – 2018-02-18 (×3): 40 mg via ORAL
  Filled 2018-02-17 (×3): qty 1

## 2018-02-17 MED ORDER — ACETAMINOPHEN 325 MG PO TABS: 650.0000 mg | ORAL_TABLET | Freq: Four times a day (QID) | ORAL | Status: DC | PRN

## 2018-02-17 MED ORDER — POLYVINYL ALCOHOL 1.4 % OP SOLN
1.0000 [drp] | Freq: Every day | OPHTHALMIC | Status: DC
  Administered 2018-02-17: 1 [drp] via OPHTHALMIC
  Filled 2018-02-17: qty 15

## 2018-02-17 MED ORDER — VITAMIN D 1000 UNITS PO TABS
1000.0000 [IU] | ORAL_TABLET | Freq: Every day | ORAL | Status: DC
  Administered 2018-02-17 – 2018-02-18 (×2): 1000 [IU] via ORAL
  Filled 2018-02-17 (×2): qty 1

## 2018-02-17 MED ORDER — APIXABAN 2.5 MG PO TABS
2.5000 mg | ORAL_TABLET | Freq: Two times a day (BID) | ORAL | Status: DC
  Administered 2018-02-17 – 2018-02-18 (×3): 2.5 mg via ORAL
  Filled 2018-02-17 (×3): qty 1

## 2018-02-17 MED ORDER — SODIUM CHLORIDE 0.9 % IV SOLN
1.0000 g | Freq: Once | INTRAVENOUS | Status: AC
  Administered 2018-02-17: 1 g via INTRAVENOUS
  Filled 2018-02-17: qty 10

## 2018-02-17 MED ORDER — APIXABAN 2.5 MG PO TABS: 2.5000 mg | ORAL_TABLET | Freq: Two times a day (BID) | ORAL | Status: AC

## 2018-02-17 MED ORDER — ONDANSETRON HCL 4 MG PO TABS: 4.0000 mg | ORAL_TABLET | Freq: Four times a day (QID) | ORAL | Status: DC | PRN

## 2018-02-17 MED ORDER — SODIUM CHLORIDE 0.9 % IV SOLN
500.0000 mg | Freq: Once | INTRAVENOUS | Status: AC
  Administered 2018-02-17: 500 mg via INTRAVENOUS
  Filled 2018-02-17: qty 500

## 2018-02-17 MED ORDER — SODIUM CHLORIDE 0.9 % IV SOLN: 500.0000 mg | INTRAVENOUS | Status: DC

## 2018-02-17 MED ORDER — ONDANSETRON HCL 4 MG/2ML IJ SOLN: 4.0000 mg | Freq: Four times a day (QID) | INTRAMUSCULAR | Status: DC | PRN

## 2018-02-17 MED ORDER — POLYETHYL GLYCOL-PROPYL GLYCOL 0.4-0.3 % OP GEL: Freq: Every day | OPHTHALMIC | Status: DC

## 2018-02-17 MED ORDER — PRAVASTATIN SODIUM 40 MG PO TABS
20.0000 mg | ORAL_TABLET | Freq: Every day | ORAL | Status: DC
  Administered 2018-02-17: 20 mg via ORAL
  Filled 2018-02-17: qty 1

## 2018-02-17 NOTE — Progress Notes (Addendum)
Patient admitted after midnight, please see H&P.   Here with PNA-- on IV Abx Needs PT Eval and wonder if he qualifies for the home first program-- lives at home alone with his dog Pogo. Care management consult ordered  Eulogio Bear DO   Addendum: family apparently coming from [facility].  They have concerns about hallucinations and paranoia--- he calls them stating that someone is after him per nursing.  Will ask for psych eval as patient has been appropriate here.

## 2018-02-17 NOTE — Evaluation (Signed)
Physical Therapy Evaluation Patient Details Name: [name] MRN: [id number] DOB: 1922/05/14 Today's Date: 02/17/2018   History of Present Illness  84yo male presenting with light headedness and general malaise, found to be hypoxic and tachycardic with ambulation in the ED. Diagnosed with CAP. PMH A-fib, CVA, HTN, joint repalcement   Clinical Impression   Patient received in bed, very pleasant and willing to work with PT this morning. Performed orthostatic assessment per RN requests (see vitals section for details), then proceeded with ambulation approximately 165f with patient pushing IV pole; SpO2 remained in the 90s on room air however he did become tachycardic with HR up to 130BPM today, asymptomatic. He was left up in the chair with all needs met this morning. He will continue to benefit from skilled PT services in the acute setting as well as skilled HHPT services moving forward.     Follow Up Recommendations Home health PT    Equipment Recommendations  None recommended by PT    Recommendations for Other Services       Precautions / Restrictions Precautions Precautions: Fall;Other (comment) Precaution Comments: watch HR  Restrictions Weight Bearing Restrictions: No      Mobility  Bed Mobility Overal bed mobility: Needs Assistance Bed Mobility: Supine to Sit     Supine to sit: Supervision     General bed mobility comments: S for safety, line management   Transfers Overall transfer level: Needs assistance Equipment used: None Transfers: Sit to/from Stand Sit to Stand: Supervision         General transfer comment: S and VC for safety   Ambulation/Gait Ambulation/Gait assistance: Min guard Ambulation Distance (Feet): 100 Feet Assistive device: None(pushed IV pole ) Gait Pattern/deviations: Step-through pattern;Trunk flexed;Wide base of support     General Gait Details: gait steady at self-selected pace, able to make turns and turn in a circle while  pushing IV pole without LOB   Stairs            Wheelchair Mobility    Modified Rankin (Stroke Patients Only)       Balance Overall balance assessment: History of Falls;Mild deficits observed, not formally tested                                           Pertinent Vitals/Pain Pain Assessment: No/denies pain    Home Living Family/patient expects to be discharged to:: Private residence Living Arrangements: Alone Available Help at Discharge: Neighbor;Available PRN/intermittently Type of Home: House Home Access: Stairs to enter Entrance Stairs-Rails: None Entrance Stairs-Number of Steps: 4 Home Layout: Multi-level Home Equipment: Shower seat      Prior Function Level of Independence: Independent               Hand Dominance        Extremity/Trunk Assessment        Lower Extremity Assessment Lower Extremity Assessment: Generalized weakness    Cervical / Trunk Assessment Cervical / Trunk Assessment: Kyphotic  Communication   Communication: HOH  Cognition Arousal/Alertness: Awake/alert Behavior During Therapy: WFL for tasks assessed/performed Overall Cognitive Status: Within Functional Limits for tasks assessed                                        General Comments General comments (skin integrity, edema, etc.): SpO2  remained in the 90s with gait, but he did become tachycardic up to 130BPM with gait     Exercises     Assessment/Plan    PT Assessment Patient needs continued PT services  PT Problem List Decreased strength;Decreased mobility;Decreased safety awareness;Decreased coordination;Decreased balance       PT Treatment Interventions DME instruction;Therapeutic activities;Gait training;Therapeutic exercise;Patient/family education;Stair training;Balance training;Functional mobility training;Neuromuscular re-education    PT Goals (Current goals can be found in the Care Plan section)  Acute Rehab PT  Goals Patient Stated Goal: to go home and enjoy the sunshine  PT Goal Formulation: With patient Time For Goal Achievement: 03/03/18 Potential to Achieve Goals: Good    Frequency Min 3X/week   Barriers to discharge        Co-evaluation               AM-PAC PT "6 Clicks" Daily Activity  Outcome Measure Difficulty turning over in bed (including adjusting bedclothes, sheets and blankets)?: None Difficulty moving from lying on back to sitting on the side of the bed? : None Difficulty sitting down on and standing up from a chair with arms (e.g., wheelchair, bedside commode, etc,.)?: None Help needed moving to and from a bed to chair (including a wheelchair)?: None Help needed walking in hospital room?: A Little Help needed climbing 3-5 steps with a railing? : A Little 6 Click Score: 22    End of Session Equipment Utilized During Treatment: Gait belt Activity Tolerance: Patient tolerated treatment well Patient left: in chair;with call bell/phone within reach   PT Visit Diagnosis: Muscle weakness (generalized) (M62.81);History of falling (Z91.81)    Time: 1610-9604 PT Time Calculation (min) (ACUTE ONLY): 38 min   Charges:   PT Evaluation $PT Eval Low Complexity: 1 Low PT Treatments $Gait Training: 8-22 mins $Therapeutic Activity: 8-22 mins   PT G Codes:        Deniece Ree PT, DPT, CBIS  Supplemental Physical Therapist [place]   Pager [phone]

## 2018-02-17 NOTE — Care Management (Addendum)
Referral to [facility] for Home First Program initiated - liaison will follow back up with CM regarding determination

## 2018-02-17 NOTE — Care Management Note (Addendum)
Case Management Note  Patient Details  Name: [name] MRN: [id number] Date of Birth: 10-12-1922  Subjective/Objective:     Pt admitted with general malaise               Action/Plan:  CM assessed pt - pt alert and oriented.  Per pt; PTA completely independent from home alone.  Pt informed CM that he has a Animator that helps if needed, Junior can be reached at [phone].  Pt informed CM that he continues to drive and is active with PCP at the [facility], pt gets his prescriptions mail ordered from [place].  Pt informed that he will need [facility]  CM offered choice of [place] agency to pt - pt in agreement with [place] contacted and referral into program has been accepted.     Expected Discharge Date:  02/17/18               Expected Discharge Plan:  [place]  In-House Referral:     Discharge planning Services  CM Consult  Post Acute Care Choice:    Choice offered to:  Patient  DME Arranged:    DME Agency:     HH Arranged:  RN, PT [place] Agency:  [place]  Status of Service:  Completed, signed off  If discussed at [place] of Stay Meetings, dates discussed:    Additional Comments: 02/17/2018 16:00 CM informed by daughter [name] that pt has recently starting have hallucinations and she voiced concerns with pt returning home alone safely. Daughter states that pt can not privately pay for ALF, pt has refused multiple times to move to [place] with daughter.  Bedside nurse informed attending of concerns - pysch consult ordered.  Per daughter she plans to travel early am to [place].   APS reports have been made by daughter- per daughter there is an ongoing case - CM communicated this to CSW.  Daughter informed of discharge plan pending psych eval of [facility] informed of latest concern and will be able to provide 24 hour  supervision at discharge starting tomorrow until family arrives.  Per daughter pt does not have PCP at [facility] left voicemail for [place] coordinator Maryclare Labrador, RN 02/17/2018, 3:40 PM

## 2018-02-17 NOTE — Plan of Care (Signed)
  Problem: Clinical Measurements: Goal: Ability to maintain clinical measurements within normal limits will improve Outcome: Progressing Goal: Respiratory complications will improve Outcome: Progressing Goal: Cardiovascular complication will be avoided Outcome: Progressing   Problem: Activity: Goal: Risk for activity intolerance will decrease Outcome: Progressing   

## 2018-02-17 NOTE — ED Notes (Signed)
Pt ambulated in hall, steady gait without assistance. Pt noted to drop O2 sats to 85% with no SOB. Pt tachy to 140s. Denies dizziness. Pt returned to bed, comfortable, NAD.

## 2018-02-17 NOTE — H&P (Signed)
History and Physical    FLEM ENDERLE HFW:[id number] DOB: 04/20/1922 DOA: 02/16/2018  PCP: [facility]   Patient coming from: Home  Chief Complaint: Lightheaded, general malaise   HPI: [name] is a 84 y.o. male with medical history significant for atrial fibrillation on Eliquis, history of CVA, now presenting to the emergency department for evaluation of lightheadedness and a general malaise.  Patient reports that he been in his usual state of health until waking this morning with some lightheadedness and nonspecific feeling of unwellness.  He tried to take a shower, but felt too ill, and came into the ED for evaluation.  He has not noted any fevers or chills, denies any chest pain, cough, or shortness of breath, and denies abdominal pain, nausea, vomiting, or diarrhea.  There has not been any headache, change in vision or hearing, or focal numbness or weakness.  ED Course: Upon arrival to the ED, patient is found to be afebrile, saturating well on room air, and with vitals otherwise normal.  EKG features atrial fibrillation and chest x-ray is notable for mild left midlung airspace opacity concerning for possible pneumonia.  Chemistry panel features a serum creatinine 1.40, consistent with his apparent baseline.  CBC is notable for a mild, stable, chronic normocytic anemia with hemoglobin of 11.9.  Troponin is undetectable.  Patient was ambulated but quickly developed marked tachycardia and hypoxia.  He was treated with Rocephin and azithromycin in the ED.  He will be observed for ongoing evaluation and management of suspected CAP.   Review of Systems:  All other systems reviewed and apart from HPI, are negative.  Past Medical History:  Diagnosis Date  . Atrial fibrillation ([place])   . History of CVA (cerebrovascular accident)   . Hyperlipidemia   . Hypertension     Past Surgical History:  Procedure Laterality Date  . JOINT REPLACEMENT       reports that he has  never smoked. He has never used smokeless tobacco. He reports that he does not drink alcohol or use drugs.  Allergies  Allergen Reactions  . Codeine Nausea And Vomiting    Family History  Problem Relation Age of Onset  . Cancer Mother      Prior to Admission medications   Medication Sig Start Date End Date Taking? Authorizing Provider  apixaban (ELIQUIS) 2.5 MG TABS tablet Take 2.5 mg by mouth daily.     [provider]  cholecalciferol (VITAMIN D) 1000 UNITS tablet Take 1,000 Units by mouth daily.    [provider]  Multiple Vitamins-Minerals (RA VISION-VITE PRESERVE PO) Take 1 tablet by mouth daily.    [provider]  omeprazole (PRILOSEC) 20 MG capsule Take 1 capsule (20 mg total) by mouth daily. 09/17/17 09/17/18  Janece Canterbury, MD  pantoprazole (PROTONIX) 40 MG tablet Take 1 tablet (40 mg total) by mouth 2 (two) times daily. 09/20/17   Charlynne Cousins, MD  Polyethyl Glycol-Propyl Glycol (SYSTANE OP) Place 1 drop into both eyes at bedtime.    [provider]  pravastatin (PRAVACHOL) 20 MG tablet Take 1 tablet (20 mg total) by mouth at bedtime. 12/10/13   Angiulli, Lavon Paganini, PA-C  vitamin B-12 (CYANOCOBALAMIN) 1000 MCG tablet Take 1,000 mcg by mouth daily.    [provider]  vitamin E 400 UNIT capsule Take 400 Units by mouth daily.     [provider]    Physical Exam: Vitals:   02/16/18 2315 02/16/18 2330 02/16/18 2345 02/17/18 0000  BP: (!) 137/109 130/62 125/70 138/68  Pulse: 76 70 76 (!) 39  Resp:    14  Temp:      TempSrc:      SpO2: 98% 98% 97% 99%      Constitutional: NAD, calm  Eyes: PERTLA, lids and conjunctivae normal ENMT: Mucous membranes are moist. Posterior pharynx clear of any exudate or lesions.   Neck: normal, supple, no masses, no thyromegaly Respiratory: Rhonchi and wheeze on the left. Normal respiratory effort.   Cardiovascular: Rate ~80 and irregular. No significant JVD. Abdomen: No  distension, no tenderness, soft. Bowel sounds normal.  Musculoskeletal: no clubbing / cyanosis. No joint deformity upper and lower extremities.  Skin: no significant rashes, lesions, ulcers. Warm, dry, well-perfused. Neurologic: No facial asymmetry. Strength 5/5 in all 4 limbs. Gross hearing deficit.  Psychiatric: Alert and oriented x 3. Very pleasant and cooperative.     Labs on Admission: I have personally reviewed following labs and imaging studies  CBC: Recent Labs  Lab 02/16/18 2304  WBC 7.3  NEUTROABS 4.9  HGB 11.9*  HCT 36.9*  MCV 95.6  PLT 626   Basic Metabolic Panel: Recent Labs  Lab 02/16/18 2304  NA 139  K 4.3  CL 106  CO2 23  GLUCOSE 92  BUN 31*  CREATININE 1.40*  CALCIUM 9.0   GFR: CrCl cannot be calculated (Unknown ideal weight.). Liver Function Tests: Recent Labs  Lab 02/16/18 2304  AST 19  ALT 10*  ALKPHOS 74  BILITOT 0.6  PROT 6.3*  ALBUMIN 3.2*   No results for input(s): LIPASE, AMYLASE in the last 168 hours. No results for input(s): AMMONIA in the last 168 hours. Coagulation Profile: No results for input(s): INR, PROTIME in the last 168 hours. Cardiac Enzymes: Recent Labs  Lab 02/16/18 2304  TROPONINI <0.03   BNP (last 3 results) No results for input(s): PROBNP in the last 8760 hours. HbA1C: No results for input(s): HGBA1C in the last 72 hours. CBG: No results for input(s): GLUCAP in the last 168 hours. Lipid Profile: No results for input(s): CHOL, HDL, LDLCALC, TRIG, CHOLHDL, LDLDIRECT in the last 72 hours. Thyroid Function Tests: No results for input(s): TSH, T4TOTAL, FREET4, T3FREE, THYROIDAB in the last 72 hours. Anemia Panel: No results for input(s): VITAMINB12, FOLATE, FERRITIN, TIBC, IRON, RETICCTPCT in the last 72 hours. Urine analysis:    Component Value Date/Time   COLORURINE YELLOW 12/07/2017 2007   APPEARANCEUR CLEAR 12/07/2017 2007   LABSPEC 1.019 12/07/2017 2007   PHURINE 5.0 12/07/2017 2007   GLUCOSEU  NEGATIVE 12/07/2017 2007   HGBUR NEGATIVE 12/07/2017 2007   BILIRUBINUR NEGATIVE 12/07/2017 2007   KETONESUR 5 (A) 12/07/2017 2007   PROTEINUR NEGATIVE 12/07/2017 2007   UROBILINOGEN 0.2 11/29/2013 1619   NITRITE NEGATIVE 12/07/2017 2007   LEUKOCYTESUR SMALL (A) 12/07/2017 2007   Sepsis Labs: @LABRCNTIP (procalcitonin:4,lacticidven:4) )No results found for this or any previous visit (from the past 240 hour(s)).   Radiological Exams on Admission: Dg Chest 2 View  Result Date: 02/16/2018 CLINICAL DATA:  Acute onset of generalized weakness. EXAM: CHEST - 2 VIEW COMPARISON:  Chest radiograph performed 12/07/2016 FINDINGS: The lungs are well-aerated. Mild left midlung airspace opacity may reflect pneumonia. There is no evidence of pleural effusion or pneumothorax. The heart is normal in size; the mediastinal contour is within normal limits. No acute osseous abnormalities are seen. IMPRESSION: Mild left midlung airspace opacity may reflect pneumonia. Electronically Signed   By: Garald Balding M.D.   On: 02/16/2018 22:13  EKG: Independently reviewed. Atrial fibrillation.   Assessment/Plan   1. CAP   - Presents with lightheadedness and general malaise, found to become tachycardic and hypoxic with minimal exertion  - CXR suggests a pneumonia and he was started on empiric Rocephin and azithromycin in ED  - Continue current abx, supportive care    2. Atrial fibrillation  - In rate-controlled AF on admission - CHADS-VASc is 74 (age x2, hx of CVA x2)  - Continue Eliquis    3. CKD stage III  - SCr is 1.40 on admission, consistent with his apparent baseline  - Renally-dose medications, avoid nephrotoxins     DVT prophylaxis: Eliquis  Code Status: DNR  Family Communication: Discussed with patient Consults called: none Admission status: Observation    Vianne Bulls, MD [facility] Pager [phone]  If 7PM-7AM, please contact night-coverage [email or web address] Password  [facility]  02/17/2018, 12:58 AM

## 2018-02-18 DIAGNOSIS — I482 Chronic atrial fibrillation: Secondary | ICD-10-CM | POA: Diagnosis not present

## 2018-02-18 DIAGNOSIS — N183 Chronic kidney disease, stage 3 (moderate): Secondary | ICD-10-CM | POA: Diagnosis not present

## 2018-02-18 DIAGNOSIS — J189 Pneumonia, unspecified organism: Secondary | ICD-10-CM | POA: Diagnosis not present

## 2018-02-18 LAB — LEGIONELLA PNEUMOPHILA SEROGP 1 UR AG: L. pneumophila Serogp 1 Ur Ag: NEGATIVE

## 2018-02-18 NOTE — Plan of Care (Signed)
  Problem: Education: Goal: Knowledge of General Education information will improve Outcome: Progressing   Problem: Clinical Measurements: Goal: Ability to maintain clinical measurements within normal limits will improve Outcome: Progressing   Problem: Pain Managment: Goal: General experience of comfort will improve Outcome: Progressing   

## 2018-02-18 NOTE — Care Management Note (Addendum)
Case Management Note  Patient Details  Name: SHAYNE DIGUGLIELMO MRN: [id number] Date of Birth: 1922-05-14  Subjective/Objective:     Pt admitted with general malaise               Action/Plan:  CM assessed pt - pt alert and oriented.  Per pt; PTA completely independent from home alone.  Pt informed CM that he has a Animator that helps if needed, Junior can be reached at [phone].  Pt informed CM that he continues to drive and is active with PCP at the [facility], pt gets his prescriptions mail ordered from [facility].  Pt informed that he will need to [facility].  CM offered choice of [place] agency to pt - pt in agreement with [place] contacted and referral into program has been accepted.     Expected Discharge Date:  02/17/18               Expected Discharge Plan:  [place]  In-House Referral:     Discharge planning Services  CM Consult  Post Acute Care Choice:    Choice offered to:  Patient  DME Arranged:    DME Agency:     HH Arranged:  RN, PT [place] Agency:  [facility]  Status of Service:  Completed, signed off  If discussed at [place] of Stay Meetings, dates discussed:    Additional Comments: 02/18/2018  [facility]/29 with a 90 day supply including Brilinta.  CM also confirmed with [facility] in [place] that pt has recently had prescriptions filled here also - plan is for pt to have antibiotic filled there at discharge.  CM informed by attending that pt will go ahead and discharge home without Psych consult here in house - attending will provide Geriatric Psych info on AVS.  CM also requested SW for [facility] with [facility] - additional referral accepted. Alvis Lemmings confirmed they are prepared to accept pt home today and will provide 24 hour supervision until daughter arrives at the home, agency will have  resources at pts home today at 4:30pm.  CM spoke with daughter and explained the above information - daughter in agreement.  CM contacted pts friend Junior - junior will pick patient up and plans to have pt home by 4:30 - if pt arrives prior to [employer] has agreed to remain with pt  CM contacted by [facility] per request of pts daughter.    Per SW pt has been uncooperative and she voiced concerns regarding the cleanliness of the home.  CM asked SW about ongoing referral ; SW would not elaborate but informed CM that it has not been determined that pt is unsafe in the home.  DSS SW informed CM to continue with the plan to send pt home with [facility] program.    [facility] informed [facility].  CM was informed that pt is not service connected however does have Dr. Ronnald Ramp with [facility] as his PCP.  CM contacted CSW Earley Abide with [facility] and also informed of admit - CM requested call back.  02/17/18 16:00 CM informed by daughter [name] that pt has recently starting have hallucinations and she voiced concerns with pt returning home alone safely. Daughter states that pt can not privately pay for ALF, pt has refused multiple times to  move to [place] with daughter.  Bedside nurse informed attending of concerns - pysch consult ordered.  Per daughter she plans to travel early am to [place].   APS reports have been made by daughter- per daughter there is an ongoing case - CM communicated this to CSW.  Daughter informed of discharge plan pending psych eval of [facility] informed of latest concern and will be able to provide 24 hour supervision at discharge starting tomorrow until family arrives.  Per daughter pt does not have PCP at [facility] left voicemail for [place] coordinator Maryclare Labrador, RN 02/18/2018, 8:53 AM

## 2018-02-18 NOTE — Progress Notes (Signed)
Physical Therapy Treatment Patient Details Name: [name] MRN: [id number] DOB: 01/14/1922 Today's Date: 02/18/2018    History of Present Illness 84yo male presenting with light headedness and general malaise, found to be hypoxic and tachycardic with ambulation in the ED. Diagnosed with CAP. PMH A-fib, CVA, HTN, joint repalcement     PT Comments    Pt very agreeable to therapy today. Pt is making good progress towards his goals. Pt continues to have tachycardia (see General Comments) with ambulation and stair training. Pt is supervision for transfers, and min guard for ambulation of 250 feet without AD and ascent/descent 14 stairs. PT recommendations remain appropriate at this time, especially with the addition of [place]. PT will continue to follow acutely.      Follow Up Recommendations  Home health PT     Equipment Recommendations  None recommended by PT    Recommendations for Other Services       Precautions / Restrictions Precautions Precautions: Fall;Other (comment) Precaution Comments: watch HR  Restrictions Weight Bearing Restrictions: No    Mobility  Bed Mobility               General bed mobility comments: OOB on entry   Transfers Overall transfer level: Needs assistance Equipment used: None Transfers: Sit to/from Stand Sit to Stand: Supervision         General transfer comment: supervision and vc for steadying before starting ambulation   Ambulation/Gait Ambulation/Gait assistance: Min guard Ambulation Distance (Feet): 250 Feet Assistive device: None Gait Pattern/deviations: Step-through pattern;Trunk flexed;Wide base of support Gait velocity: slowed Gait velocity interpretation: 1.31 - 2.62 ft/sec, indicative of limited community ambulator General Gait Details: slow, steady gait, no LoB, minor drifting R and L on long straight stretch in hallway    Stairs Stairs: Yes Stairs assistance: Min guard Stair Management: One rail  Left;Forwards;Alternating pattern Number of Stairs: 14 General stair comments: slow, steady ascent/descent of stairs       Balance Overall balance assessment: History of Falls;Mild deficits observed, not formally tested                                          Cognition Arousal/Alertness: Awake/alert Behavior During Therapy: WFL for tasks assessed/performed Overall Cognitive Status: Within Functional Limits for tasks assessed                                           General Comments General comments (skin integrity, edema, etc.): SaO2 on RA >93%O2, HR with stairs 135bpm with slight SoB, HR with ambulation up to 130 bpm.       Pertinent Vitals/Pain Pain Assessment: No/denies pain           PT Goals (current goals can now be found in the care plan section) Acute Rehab PT Goals Patient Stated Goal: to go home and enjoy the sunshine  PT Goal Formulation: With patient Time For Goal Achievement: 03/03/18 Potential to Achieve Goals: Good Progress towards PT goals: Progressing toward goals    Frequency    Min 3X/week      PT Plan Current plan remains appropriate       AM-PAC PT "6 Clicks" Daily Activity  Outcome Measure  Difficulty turning over in bed (including adjusting bedclothes, sheets and blankets)?: None Difficulty  moving from lying on back to sitting on the side of the bed? : None Difficulty sitting down on and standing up from a chair with arms (e.g., wheelchair, bedside commode, etc,.)?: None Help needed moving to and from a bed to chair (including a wheelchair)?: None Help needed walking in hospital room?: A Little Help needed climbing 3-5 steps with a railing? : A Little 6 Click Score: 22    End of Session Equipment Utilized During Treatment: Gait belt Activity Tolerance: Patient tolerated treatment well Patient left: in chair;with call bell/phone within reach;with chair alarm set Nurse Communication: Mobility  status PT Visit Diagnosis: Muscle weakness (generalized) (M62.81);History of falling (Z91.81)     Time: 6153-7943 PT Time Calculation (min) (ACUTE ONLY): 23 min  Charges:  $Gait Training: 23-37 mins                    G Codes:       [name] B. Migdalia Dk PT, DPT Acute Rehabilitation  [phone] Pager [phone]     [place] 02/18/2018, 12:34 PM

## 2018-02-18 NOTE — Consult Note (Signed)
   [facility] Inpatient Consult   02/18/2018  ATHONY COPPA 1922/01/12 [id number]   Made aware by Tommi Rumps with [facility] that Mr. Seufert will enroll in the [place] program.   Spoke with Mr. Doberstein at bedside. Made him aware that [facility] Management would assist if transportation or pharmacy needs are identified while on the [place] program.   Mr. Gebhard expresses appreciation.   Will send notification to [place] Management staff of Mr. Flythe enrollment in the [place], [place], RN,BSN [facility] Liaison [phone]

## 2018-02-18 NOTE — Discharge Summary (Signed)
Physician Discharge Summary  [name] HDQ:[id number] DOB: October 10, 1922 DOA: 02/16/2018  [facility]P: [facility]  Admit date: 02/16/2018 Discharge date: 02/18/2018   Recommendations for Outpatient Follow-Up:   1. [place] home first program ordered 2. Family with concerns for hallucinations/paranoia--- has been in hospital > 48 hours with absolutely no issues except is hard of hearing and sometimes if he does not hear will answer questions inappropriately---- if family has concerns, can take him to [place] (discussed with psych Dr. Mariea Clonts)   Discharge Diagnosis:   Principal Problem:   CAP (community acquired pneumonia) Active Problems:   Atrial fibrillation, chronic ([place])   CKD (chronic kidney disease), stage III [facility])   Discharge disposition:  Home  Discharge Condition: Improved.  Diet recommendation: Low sodium, heart healthy  Wound care: None.   History of Present Illness:   [name] is a 84 y.o. male with medical history significant for atrial fibrillation on Eliquis, history of CVA, now presenting to the emergency department for evaluation of lightheadedness and a general malaise.  Patient reports that he been in his usual state of health until waking this morning with some lightheadedness and nonspecific feeling of unwellness.  He tried to take a shower, but felt too ill, and came into the ED for evaluation.  He has not noted any fevers or chills, denies any chest pain, cough, or shortness of breath, and denies abdominal pain, nausea, vomiting, or diarrhea.  There has not been any headache, change in vision or hearing, or focal numbness or weakness.      Hospital Course by Problem:   CAP   - Presents with lightheadedness and general malaise, found to become tachycardic and hypoxic with minimal exertion  - CXR suggests a pneumonia and he was started on empiric Rocephin and azithromycin in ED -- change to doxy to finish at home -improving with  PT  chronic Atrial fibrillation  - In rate-controlled AF on admission-- not on any rate controlling meds due to tachy/brday issues in past-- follows with Dr. Einar Gip - CHADS-VASc is 68 (age x2, hx of CVA x2)  - Continue Eliquis BID   CKD stage III  - SCr is 1.40 on admission, consistent with his apparent baseline  - Renally-dose medications, avoid nephrotoxins    Have ordered home health to maximize help at home    Medical Consultants:    None.   Discharge Exam:   Vitals:   02/17/18 2035 02/18/18 0653  BP: (!) 145/93 139/88  Pulse: 100 99  Resp: 18 17  Temp: 97.7 F (36.5 C) 97.6 F (36.4 C)  SpO2: 98% 97%   Vitals:   02/17/18 0530 02/17/18 1340 02/17/18 2035 02/18/18 0653  BP: 136/83 (!) 125/97 (!) 145/93 139/88  Pulse: 76 (!) 114 100 99  Resp: 18 20 18 17   Temp: 97.7 F (36.5 C) 97.9 F (36.6 C) 97.7 F (36.5 C) 97.6 F (36.4 C)  TempSrc: Oral Oral Oral Oral  SpO2: 100% 100% 98% 97%  Weight: 55 kg (121 lb 4.1 oz)     Height: 5\' 7"  (1.702 m)       Gen:  NAD-- excited to get home to his dog POGO    The results of significant diagnostics from this hospitalization (including imaging, microbiology, ancillary and laboratory) are listed below for reference.     Procedures and Diagnostic Studies:   Dg Chest 2 View  Result Date: 02/16/2018 CLINICAL DATA:  Acute onset of generalized weakness. EXAM: CHEST - 2  VIEW COMPARISON:  Chest radiograph performed 12/07/2016 FINDINGS: The lungs are well-aerated. Mild left midlung airspace opacity may reflect pneumonia. There is no evidence of pleural effusion or pneumothorax. The heart is normal in size; the mediastinal contour is within normal limits. No acute osseous abnormalities are seen. IMPRESSION: Mild left midlung airspace opacity may reflect pneumonia. Electronically Signed   By: Garald Balding M.D.   On: 02/16/2018 22:13     Labs:   Basic Metabolic Panel: Recent Labs  Lab 02/16/18 2304 02/17/18 0307  NA 139  140  K 4.3 4.0  CL 106 107  CO2 23 23  GLUCOSE 92 89  BUN 31* 30*  CREATININE 1.40* 1.38*  CALCIUM 9.0 8.9   GFR Estimated Creatinine Clearance: 25.5 mL/min (A) (by C-G formula based on SCr of 1.38 mg/dL (H)). Liver Function Tests: Recent Labs  Lab 02/16/18 2304  AST 19  ALT 10*  ALKPHOS 74  BILITOT 0.6  PROT 6.3*  ALBUMIN 3.2*   No results for input(s): LIPASE, AMYLASE in the last 168 hours. No results for input(s): AMMONIA in the last 168 hours. Coagulation profile No results for input(s): INR, PROTIME in the last 168 hours.  CBC: Recent Labs  Lab 02/16/18 2304 02/17/18 0307  WBC 7.3 7.4  NEUTROABS 4.9 4.5  HGB 11.9* 12.1*  HCT 36.9* 36.8*  MCV 95.6 95.8  PLT 233 229   Cardiac Enzymes: Recent Labs  Lab 02/16/18 2304  TROPONINI <0.03   BNP: Invalid input(s): POCBNP CBG: No results for input(s): GLUCAP in the last 168 hours. D-Dimer No results for input(s): DDIMER in the last 72 hours. Hgb A1c No results for input(s): HGBA1C in the last 72 hours. Lipid Profile No results for input(s): CHOL, HDL, LDLCALC, TRIG, CHOLHDL, LDLDIRECT in the last 72 hours. Thyroid function studies No results for input(s): TSH, T4TOTAL, T3FREE, THYROIDAB in the last 72 hours.  Invalid input(s): FREET3 Anemia work up No results for input(s): VITAMINB12, FOLATE, FERRITIN, TIBC, IRON, RETICCTPCT in the last 72 hours. Microbiology No results found for this or any previous visit (from the past 240 hour(s)).   Discharge Instructions:   Discharge Instructions    Diet - low sodium heart healthy   Complete by:  As directed    Discharge instructions   Complete by:  As directed    [place] home first/home health (listed eliquis as once/day when should be BID) -follow up with cardiology Dr. Einar Gip   Increase activity slowly   Complete by:  As directed      Allergies as of 02/18/2018      Reactions   Codeine Nausea And Vomiting      Medication List    STOP taking these  medications   omeprazole 20 MG capsule Commonly known as:  PRILOSEC   RA VISION-VITE PRESERVE PO   vitamin E 400 UNIT capsule     TAKE these medications   apixaban 2.5 MG Tabs tablet Commonly known as:  ELIQUIS Take 1 tablet (2.5 mg total) by mouth 2 (two) times daily. What changed:  when to take this   cholecalciferol 1000 units tablet Commonly known as:  VITAMIN D Take 1,000 Units by mouth daily.   doxycycline 100 MG tablet Commonly known as:  VIBRA-TABS Take 1 tablet (100 mg total) by mouth every 12 (twelve) hours.   pantoprazole 40 MG tablet Commonly known as:  PROTONIX Take 1 tablet (40 mg total) by mouth 2 (two) times daily.   pravastatin 20 MG tablet Commonly known  as:  PRAVACHOL Take 1 tablet (20 mg total) by mouth at bedtime.   SYSTANE OP Place 1 drop into both eyes at bedtime.   vitamin B-12 1000 MCG tablet Commonly known as:  CYANOCOBALAMIN Take 1,000 mcg by mouth daily.      Follow-up Information    [facility] in 1 week(s).   Why:  BMP Contact information: [place] [address] [phone]        Adrian Prows, MD Follow up.   Specialty:  Cardiology Contact information: [address] [place] [address] [phone]            Time coordinating discharge: 35 min (ob)  Signed:  Geradine Girt   [facility] 02/18/2018, 1:16 PM

## 2018-02-18 NOTE — Care Management Obs Status (Signed)
[place] NOTIFICATION   Patient Details  Name: [name] MRN: [id number] Date of Birth: Jan 08, 1922   Medicare Observation Status Notification Given:  Yes   Pt requested CM contact daughter [name] and explain info to her.  CM contacted daughter and read MOON letter via phone - initials placed on the form per daughter/pt consent.  RN Freada Bergeron provided second verification regarding MOON administration via phone    Maryclare Labrador, RN 02/18/2018, 10:47 AM

## 2018-02-18 NOTE — Progress Notes (Signed)
1600 Patient discharged to home. Patient and family friend Chief Technology Officer) provided discharge instructions including discharge medications and follow up MD visits.

## 2018-02-18 NOTE — Discharge Instructions (Signed)
If family has concerns about hallucinations/paranoia can take patient to be evaluated at [facility]

## 2018-02-19 NOTE — Social Work (Addendum)
CSW received phone call from [facility] social worker Towanda Malkin in regards to open case with pt that pt daughter had requested.   APS actively following case. CSW from APS states that pt daughter is concerned about pt living environment and ability to function independently. Per PT note- "Pt is supervision for transfers, and min guard for ambulation of 250 feet without AD and ascent/descent 14 stairs," APS worker informed of this. CSW explained that RN Case Manager had arranged home health through [facility]. Pt was admitted under observation- would have to have a medically qualifying 3 night inpatient stay for Medicare purposes for SNF, which pt does not currently meet.   CSW from APS states understanding, asked if there was a psych eval pending, this CSW stated that at the time there was not and pt was likely discharge home today. APS CSW states that she is following pt still when he discharges, states she knows his house is unclean and pt will likely not open the door for home health, but that with current capacity and orientation we cannot discharge pt to another level of care.   CSW signing off. Please consult if any additional needs arise.  [name], [place] Work [phone]

## 2018-02-19 NOTE — Care Management (Signed)
Pt discharged yesterday 02/18/18 with detailed discharge plan as noted in CM note 4/24 filed at 2:41pm.  CM informed by both [name] and daughter [name] that once pt arrived home he refused all [place] services.  Daughter arrived in the home late yesterday and remained with pt overnight.  CM contacted by daughter today 4/25 - daughter states that pt has now kicked her out of the home and will not allow family to assist in cleaning home nor medication reconciliation/adminsitration, pt continues to refuse HH.  CM discussed case with CSW AD and the recommendation was made to have daughter call APS worker per ongoing case.  CM communicated this to daughter.   CM also contacted APS worker and communicated latest events - requested APS worker to contact daughter regarding latest concerns.  APS worker reiterated that pt has capacity.

## 2018-04-02 ENCOUNTER — Emergency Department (HOSPITAL_COMMUNITY): Payer: Medicare Other

## 2018-04-02 ENCOUNTER — Emergency Department (HOSPITAL_COMMUNITY)
Admission: EM | Admit: 2018-04-02 | Discharge: 2018-04-02 | Discharge status: Against Medical Advice | Payer: Medicare Other | Attending: Emergency Medicine | Admitting: Emergency Medicine

## 2018-04-02 ENCOUNTER — Encounter (HOSPITAL_COMMUNITY): Payer: Self-pay | Admitting: Emergency Medicine

## 2018-04-02 ENCOUNTER — Other Ambulatory Visit: Payer: Self-pay

## 2018-04-02 DIAGNOSIS — N183 Chronic kidney disease, stage 3 (moderate): Secondary | ICD-10-CM | POA: Diagnosis not present

## 2018-04-02 DIAGNOSIS — R4182 Altered mental status, unspecified: Secondary | ICD-10-CM | POA: Diagnosis not present

## 2018-04-02 DIAGNOSIS — Z96653 Presence of artificial knee joint, bilateral: Secondary | ICD-10-CM | POA: Diagnosis not present

## 2018-04-02 DIAGNOSIS — I129 Hypertensive chronic kidney disease with stage 1 through stage 4 chronic kidney disease, or unspecified chronic kidney disease: Secondary | ICD-10-CM | POA: Diagnosis not present

## 2018-04-02 DIAGNOSIS — R0902 Hypoxemia: Secondary | ICD-10-CM | POA: Diagnosis not present

## 2018-04-02 DIAGNOSIS — R413 Other amnesia: Secondary | ICD-10-CM | POA: Insufficient documentation

## 2018-04-02 DIAGNOSIS — Z7901 Long term (current) use of anticoagulants: Secondary | ICD-10-CM | POA: Diagnosis not present

## 2018-04-02 LAB — CBC WITH DIFFERENTIAL/PLATELET
Basophils Absolute: 0 10*3/uL (ref 0.0–0.1)
Basophils Relative: 0 %
Eosinophils Absolute: 0 10*3/uL (ref 0.0–0.7)
Eosinophils Relative: 0 %
HCT: 38.5 % — ABNORMAL LOW (ref 39.0–52.0)
Hemoglobin: 12.8 g/dL — ABNORMAL LOW (ref 13.0–17.0)
Lymphocytes Relative: 23 %
Lymphs Abs: 1.2 10*3/uL (ref 0.7–4.0)
MCH: 31.4 pg (ref 26.0–34.0)
MCHC: 33.2 g/dL (ref 30.0–36.0)
MCV: 94.4 fL (ref 78.0–100.0)
Monocytes Absolute: 0.5 10*3/uL (ref 0.1–1.0)
Monocytes Relative: 10 %
Neutro Abs: 3.4 10*3/uL (ref 1.7–7.7)
Neutrophils Relative %: 67 %
Platelets: 214 10*3/uL (ref 150–400)
RBC: 4.08 MIL/uL — ABNORMAL LOW (ref 4.22–5.81)
RDW: 12.6 % (ref 11.5–15.5)
WBC: 5.1 10*3/uL (ref 4.0–10.5)

## 2018-04-02 LAB — ACETAMINOPHEN LEVEL: Acetaminophen (Tylenol), Serum: <10 ug/mL — ABNORMAL LOW (ref 10–30)

## 2018-04-02 LAB — COMPREHENSIVE METABOLIC PANEL
ALT: 11 U/L — ABNORMAL LOW (ref 17–63)
AST: 20 U/L (ref 15–41)
Albumin: 3.6 g/dL (ref 3.5–5.0)
Alkaline Phosphatase: 71 U/L (ref 38–126)
Anion gap: 9 (ref 5–15)
BUN: 25 mg/dL — ABNORMAL HIGH (ref 6–20)
CO2: 27 mmol/L (ref 22–32)
Calcium: 9.2 mg/dL (ref 8.9–10.3)
Chloride: 102 mmol/L (ref 101–111)
Creatinine, Ser: 1.49 mg/dL — ABNORMAL HIGH (ref 0.61–1.24)
GFR calc Af Amer: 44 mL/min — ABNORMAL LOW (ref >60)
GFR calc non Af Amer: 38 mL/min — ABNORMAL LOW (ref >60)
Glucose, Bld: 98 mg/dL (ref 65–99)
Potassium: 4.6 mmol/L (ref 3.5–5.1)
Sodium: 138 mmol/L (ref 135–145)
Total Bilirubin: 1.3 mg/dL — ABNORMAL HIGH (ref 0.3–1.2)
Total Protein: 6.7 g/dL (ref 6.5–8.1)

## 2018-04-02 LAB — SALICYLATE LEVEL: Salicylate Lvl: <7 mg/dL (ref 2.8–30.0)

## 2018-04-02 NOTE — Discharge Instructions (Signed)
Please follow-up with your primary care provider for further evaluation.  Continue your home medications as previously prescribed.

## 2018-04-02 NOTE — Progress Notes (Signed)
Pt does not meet criteria for inpt treatment per Lindon Romp, NP. EDP Hina, PA advised of the disposition.  Lind Covert, MSW, LCSW Therapeutic Triage Specialist  [phone]

## 2018-04-02 NOTE — Progress Notes (Signed)
CSW met with EDP who states TTS consult is in place but a room is being sought in which to conduct the TTS consult.  Pt is hard-of-hearing and a loud voice and clear eye contact is needed to complete an assessment, per pt's RN.  CSW met with pt and requested to speak with the pt's daughter and pt asked why.  CSW stated CSW wanted to "make myself available to both you and your daughter for any social work needs, resources or needed information" and pt replied, "I would appreciate it if you would not", then pt continued, "My daughter knows where I am and I will call her when I get home". Pt then inquired why the CSW would want to speak to the pt's daughter and CSW repeated the above and pt stated, "No, I'd rather you not".  CSW encouraged pt to please ask for the CSW if he changed his mind or needed assistance with anything else.  Pt voiced understanding.  CSW then facilitated pt being assessed by TTS in ED TCU vacant room [phone]), CN and TCU RN concurred and pt's RN assisted the pt with ambulating to room WA32.  Pt is being assessed now.  Pt was stating he was not going to comply and that he was going to leave but still followed the CSW and the pt's RN to the room to be assessed stating, "I usually do what I'm told, but I might not this time, but I guess I'll go".  CSW will continue to follow for D/C needs.  Per EDP, pt's daughter's phone number is ph: [place] Zubin Pontillo, LCSW, LCAS, CSI Clinical Social Worker Ph: [phone]

## 2018-04-02 NOTE — ED Notes (Signed)
Pt leaving AMA. Paperwork given to pt

## 2018-04-02 NOTE — Progress Notes (Signed)
Consult request has been received. CSW attempting to follow up at present time.  CSW notes pt, per chart, is his own legal guardian.  CSW will continue to follow for D/C needs.  Alphonse Guild. Keiera Strathman, LCSW, LCAS, CSI Clinical Social Worker Ph: [phone]     '

## 2018-04-02 NOTE — ED Triage Notes (Addendum)
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

## 2018-04-02 NOTE — ED Notes (Signed)
Bed: [facility] Expected date:  Expected time:  Means of arrival:  Comments: EMS memory problems for a few weeks

## 2018-04-02 NOTE — ED Notes (Addendum)
Pt stating that he doesn't want to be here and he is ready to leave

## 2018-04-02 NOTE — BH Assessment (Addendum)
Assessment Note  [name] is an 84 y.o. male who presents to the ED voluntarily. 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 Pt states he was headed to get breakfast this morning when he began feeling "off" and went to the FD because he did not want to accidentallly hurt anyone else. Per chart, pt's [name] lives in [place] and the pt has an upcoming hearing for capacity as his [name] is attempting to gain legal guardianship. At present, the pt is his own legal guardian. TTS asked the pt's permission to contact his [name] in order to obtain collateral information and he stated "No, do not call her. I do not want her in my business." Pt denies SI, HI, and AVH at present. Chart states the pt's [name] reported to the EDP that he has been hallucinating but the pt denies this. Pt states he wants to leave the ED and states if we do not d/c him, he is going to walk out.   Pt is pleasant and cooperative throughout the assessment. Pt is smiling at this Probation officer and making appropriate eye contact. Pt laughs during the assessment and stated he was headed to "[employer] to have breakfast."   Pt does not meet criteria for inpt treatment per Lindon Romp, NP. EDP Hina, PA advised of the disposition.  Diagnosis: Mild neurocognitive disorder due to another medical condition  Past Medical History:  Past Medical History:  Diagnosis Date  . Atrial fibrillation ([place])   . CAP (community acquired pneumonia) 02/16/2018  . Chronic a-fib ([place])    Archie Endo 09/20/2017  . CKD (chronic kidney disease), stage III ([place])    Archie Endo 02/17/2018  . History of hiatal hernia    Archie Endo 09/20/2017  . Hyperlipidemia   . Hypertension   . Stroke ([place]) 04/20/2008   Archie Endo 01/23/2010  . Stroke [facility]) 11/2013   Large posterior and inferior right MCA stroke/infarct/notes 12/22/2013    Past Surgical History:  Procedure  Laterality Date  . ANAL FISTULOTOMY  10/2006   Archie Endo 03/13/2011  . JOINT REPLACEMENT    . ROTATOR CUFF REPAIR Left 08/1995   Archie Endo 02/27/2011  . TOTAL KNEE ARTHROPLASTY Right 03/2007   Archie Endo 02/27/2011  . TOTAL KNEE ARTHROPLASTY Left 03/2009   Archie Endo 02/27/2011    Family History:  Family History  Problem Relation Age of Onset  . Cancer Mother     Social History:  reports that he has never smoked. He has never used smokeless tobacco. He reports that he does not drink alcohol or use drugs.  Additional Social History:  Alcohol / Drug Use Pain Medications: See MAR Prescriptions: See MAR Over the Counter: See MAR History of alcohol / drug use?: No history of alcohol / drug abuse  CIWA: CIWA-Ar BP: (!) 142/76 Pulse Rate: 70 COWS:    Allergies:  Allergies  Allergen Reactions  . Codeine Nausea And Vomiting    Home Medications:  (Not in a hospital admission)  OB/GYN Status:  No LMP for male patient.  General Assessment Data Assessment unable to be completed: Yes Reason for not completing assessment: Patient in hallway, no available [facility] of Assessment: [facility] TTS Assessment: In system Is this a Tele or Face-to-Face Assessment?: Face-to-Face Is this an Initial Assessment or a Re-assessment for this encounter?: Initial Assessment Marital status: Widowed Is patient pregnant?: No Pregnancy Status: No Living Arrangements: Alone Can pt return to current living arrangement?:  Yes Admission Status: Voluntary Is patient capable of signing voluntary admission?: Yes Referral Source: Self/Family/Friend Insurance type: [place]     Crisis Care Plan Living Arrangements: Alone Name of Psychiatrist: none Name of Therapist: none  Education Status Is patient currently in school?: No Is the patient employed, unemployed or receiving disability?: Unemployed  Risk to self with the past 6 months Suicidal Ideation: No Has patient been a risk to self within the past 6 months prior to  admission? : No Suicidal Intent: No Has patient had any suicidal intent within the past 6 months prior to admission? : No Is patient at risk for suicide?: No Suicidal Plan?: No Has patient had any suicidal plan within the past 6 months prior to admission? : No Access to Means: No What has been your use of drugs/alcohol within the last 12 months?: denies use Previous Attempts/Gestures: No Triggers for Past Attempts: None known Intentional Self Injurious Behavior: None Family Suicide History: No Recent stressful life event(s): Other (Comment)(memory loss ) Persecutory voices/beliefs?: No Depression: No Substance abuse history and/or treatment for substance abuse?: No Suicide prevention information given to non-admitted patients: Not applicable  Risk to Others within the past 6 months Homicidal Ideation: No Does patient have any lifetime risk of violence toward others beyond the six months prior to admission? : No Thoughts of Harm to Others: No Current Homicidal Intent: No Current Homicidal Plan: No Access to Homicidal Means: No History of harm to others?: No Assessment of Violence: None Noted Does patient have access to weapons?: No Criminal Charges Pending?: No Does patient have a court date: No Is patient on probation?: No  Psychosis Hallucinations: None noted Delusions: None noted  Mental Status Report Appearance/Hygiene: Unremarkable Eye Contact: Good Motor Activity: Unsteady Speech: Logical/coherent Level of Consciousness: Alert Mood: Euthymic Affect: Appropriate to circumstance Anxiety Level: None Thought Processes: Coherent, Relevant Judgement: Unimpaired Orientation: Person, Place, Time, Situation, Appropriate for developmental age Obsessive Compulsive Thoughts/Behaviors: None  Cognitive Functioning Concentration: Normal Memory: Recent Impaired, Remote Impaired Is patient IDD: No Is patient DD?: No Insight: Good Impulse Control: Good Appetite: Good Have  you had any weight changes? : No Change Sleep: No Change Total Hours of Sleep: 8 Vegetative Symptoms: None  ADLScreening [facility] Assessment Services) Patient's cognitive ability adequate to safely complete daily activities?: Yes Patient able to express need for assistance with ADLs?: Yes Independently performs ADLs?: Yes (appropriate for developmental age)  Prior Inpatient Therapy Prior Inpatient Therapy: No  Prior Outpatient Therapy Prior Outpatient Therapy: No Does patient have an ACCT team?: No Does patient have Intensive In-House Services?  : No Does patient have [facility] services? : No Does patient have P4CC services?: No  ADL Screening (condition at time of admission) Patient's cognitive ability adequate to safely complete daily activities?: Yes Is the patient deaf or have difficulty hearing?: No Does the patient have difficulty seeing, even when wearing glasses/contacts?: No Does the patient have difficulty concentrating, remembering, or making decisions?: Yes Patient able to express need for assistance with ADLs?: Yes Does the patient have difficulty dressing or bathing?: No Independently performs ADLs?: Yes (appropriate for developmental age) Does the patient have difficulty walking or climbing stairs?: Yes Weakness of Legs: Both Weakness of Arms/Hands: Both  Home Assistive Devices/Equipment Home Assistive Devices/Equipment: Cane (specify quad or straight)    Abuse/Neglect Assessment (Assessment to be complete while patient is alone) Abuse/Neglect Assessment Can Be Completed: Yes Physical Abuse: Denies Verbal Abuse: Denies Sexual Abuse: Denies Exploitation of patient/patient's resources: Denies Self-Neglect: Denies  Advance Directives (For Healthcare) Does Patient Have a Medical Advance Directive?: No Would patient like information on creating a medical advance directive?: No - Patient declined    Additional Information 1:1 In Past 12 Months?: No CIRT Risk:  No Elopement Risk: No Does patient have medical clearance?: Yes     Disposition: Pt does not meet criteria for inpt treatment per Lindon Romp, NP. EDP Hina, PA advised of the disposition. Disposition Initial Assessment Completed for this Encounter: Yes Disposition of Patient: Discharge(per Lindon Romp, NP) Patient refused recommended treatment: No Mode of transportation if patient is discharged?: Car  On Site Evaluation by:   Reviewed with Physician:    Lyanne Co 04/02/2018 8:30 PM

## 2018-04-02 NOTE — ED Notes (Signed)
EMS spoke with [name], pt daughter, during triage. Daughter lives in [place] verbalizes "he has been hallucinating over past few weeks; requesting psychiatric evaluation." This was translated to me from EMS.   NOTE: pt is hard of hearing; please note when speaking with pt. Pt understands well with speaking clear, loud and eye contact.

## 2018-04-02 NOTE — [facility] Counselor (Signed)
TTS attempted to see patient. Patient unable to be seen. Patient presently located in [facility], there is not available rooms.

## 2018-04-02 NOTE — ED Provider Notes (Signed)
[facility] DEPT Provider Note   CSN: [id number] Arrival date & time: 04/02/18  1351     History   Chief Complaint Chief Complaint  Patient presents with  . Memory Loss    HPI [name] is a 84 y.o. male with a past medical history of hypertension, hyperlipidemia, atrial fibrillation currently on Eliquis, prior CVA x2, stage III CKD, who presents to ED for evaluation of memory loss/hallucinations.  Patient resides at home alone with his puppy.  He is unsure why he is here.  EMS states that he went to the fire department concerned about his memory loss for a few weeks.  According to his daughter who lives in [place], patient has been hallucinating for several weeks and is requesting a psychiatric evaluation.  Patient states that "I feel 100% great, I don't know why I'm here because I feel great."  Denies any chest pain, abdominal pain, auditory or visual hallucinations, suicidal or homicidal ideations, injuries or falls, headache, vision changes, changes to activity or appetite.  HPI  Past Medical History:  Diagnosis Date  . Atrial fibrillation ([place])   . CAP (community acquired pneumonia) 02/16/2018  . Chronic a-fib ([place])    Archie Endo 09/20/2017  . CKD (chronic kidney disease), stage III ([place])    Archie Endo 02/17/2018  . History of hiatal hernia    Archie Endo 09/20/2017  . Hyperlipidemia   . Hypertension   . Stroke ([place]) 04/20/2008   Archie Endo 01/23/2010  . Stroke [facility]) 11/2013   Large posterior and inferior right MCA stroke/infarct/notes 12/22/2013    Patient Active Problem List   Diagnosis Date Noted  . CAP (community acquired pneumonia) 02/17/2018  . CKD (chronic kidney disease), stage III ([place]) 02/17/2018  . Rib pain on right side 09/20/2017  . Chest pain, pleuritic 09/20/2017  . Abdominal pain 09/17/2017  . Hyperlipidemia 09/17/2017  . Hypertension 09/17/2017  . Anemia 09/17/2017  . Atrial fibrillation, chronic ([place]) 11/29/2013  . CVA (cerebral  infarction) 11/29/2013  . OTHER VITAMIN B12 DEFICIENCY ANEMIA 01/08/2008  . ANAL FISTULA 01/08/2008    Past Surgical History:  Procedure Laterality Date  . ANAL FISTULOTOMY  10/2006   Archie Endo 03/13/2011  . JOINT REPLACEMENT    . ROTATOR CUFF REPAIR Left 08/1995   Archie Endo 02/27/2011  . TOTAL KNEE ARTHROPLASTY Right 03/2007   Archie Endo 02/27/2011  . TOTAL KNEE ARTHROPLASTY Left 03/2009   Archie Endo 02/27/2011        Home Medications    Prior to Admission medications   Medication Sig Start Date End Date Taking? Authorizing Provider  apixaban (ELIQUIS) 2.5 MG TABS tablet Take 1 tablet (2.5 mg total) by mouth 2 (two) times daily. 02/17/18  Yes Geradine Girt, DO  doxycycline (VIBRA-TABS) 100 MG tablet Take 1 tablet (100 mg total) by mouth every 12 (twelve) hours. Patient not taking: Reported on 04/02/2018 02/17/18   Geradine Girt, DO  pantoprazole (PROTONIX) 40 MG tablet Take 1 tablet (40 mg total) by mouth 2 (two) times daily. Patient not taking: Reported on 04/02/2018 09/20/17   Charlynne Cousins, MD  pravastatin (PRAVACHOL) 20 MG tablet Take 1 tablet (20 mg total) by mouth at bedtime. Patient not taking: Reported on 04/02/2018 12/10/13   Cathlyn Parsons, PA-C    Family History Family History  Problem Relation Age of Onset  . Cancer Mother     Social History Social History   Tobacco Use  . Smoking status: Never Smoker  . Smokeless tobacco: Never Used  Substance Use Topics  . Alcohol use: No  . Drug use: No     Allergies   Codeine   Review of Systems Review of Systems  Unable to perform ROS: Age  Constitutional: Negative for fever.  Cardiovascular: Negative for chest pain.  Gastrointestinal: Negative for abdominal pain.  Neurological: Negative for headaches.     Physical Exam Updated Vital Signs BP (!) 142/76 (BP Location: Right Arm)   Pulse 70   Temp 97.9 F (36.6 C) (Oral)   Resp 18   SpO2 99%   Physical Exam  Constitutional: He appears well-developed and  well-nourished. No distress.  HENT:  Head: Normocephalic and atraumatic.  Nose: Nose normal.  Eyes: Conjunctivae and EOM are normal. Right eye exhibits no discharge. Left eye exhibits no discharge. No scleral icterus.  Neck: Normal range of motion. Neck supple.  Cardiovascular: Normal rate, regular rhythm, normal heart sounds and intact distal pulses. Exam reveals no gallop and no friction rub.  No murmur heard. Pulmonary/Chest: Effort normal and breath sounds normal. No respiratory distress.  Abdominal: Soft. Bowel sounds are normal. He exhibits no distension. There is no tenderness. There is no guarding.  Musculoskeletal: Normal range of motion. He exhibits no edema.  Neurological: He is alert. He exhibits normal muscle tone. Coordination normal.  Alert and oriented to self, birthday.  Tells me that the year is "79."  Tells me that his male puppy's name is Curator, and his daughter's names are [name] and Jan.  Lonna Duval that he is at [facility] in [place].  Unable to tell me month, day of year. Pupils reactive. No facial asymmetry noted. Cranial nerves appear grossly intact.  Skin: Skin is warm and dry. No rash noted. He is not diaphoretic.  Psychiatric: He has a normal mood and affect.  Nursing note and vitals reviewed.    ED Treatments / Results  Labs (all labs ordered are listed, but only abnormal results are displayed) Labs Reviewed  COMPREHENSIVE METABOLIC PANEL - Abnormal; Notable for the following components:      Result Value   BUN 25 (*)    Creatinine, Ser 1.49 (*)    ALT 11 (*)    Total Bilirubin 1.3 (*)    GFR calc non Af Amer 38 (*)    GFR calc Af Amer 44 (*)    All other components within normal limits  CBC WITH DIFFERENTIAL/PLATELET - Abnormal; Notable for the following components:   RBC 4.08 (*)    Hemoglobin 12.8 (*)    HCT 38.5 (*)    All other components within normal limits  ACETAMINOPHEN LEVEL - Abnormal; Notable for the following components:    Acetaminophen (Tylenol), Serum <10 (*)    All other components within normal limits  URINE CULTURE  SALICYLATE LEVEL  URINALYSIS, ROUTINE W REFLEX MICROSCOPIC  RAPID URINE DRUG SCREEN, HOSP PERFORMED    EKG None  Radiology Dg Chest 2 View  Result Date: 04/02/2018 CLINICAL DATA:  Altered mental status. EXAM: CHEST - 2 VIEW COMPARISON:  Radiographs of February 16, 2018. FINDINGS: The heart size and mediastinal contours are within normal limits. Both lungs are clear. Atherosclerosis of thoracic aorta is noted. No pneumothorax or pleural effusion is noted. The visualized skeletal structures are unremarkable. IMPRESSION: No active cardiopulmonary disease. Aortic Atherosclerosis (ICD10-I70.0). Electronically Signed   By: Marijo Conception, M.D.   On: 04/02/2018 15:21   Ct Head Wo Contrast  Result Date: 04/02/2018 CLINICAL DATA:  84 year old male with history of altered mental  status. Poor historian. History of stroke. EXAM: CT HEAD WITHOUT CONTRAST TECHNIQUE: Contiguous axial images were obtained from the base of the skull through the vertex without intravenous contrast. COMPARISON:  Head CT 12/07/2017. FINDINGS: Brain: Large area of low attenuation throughout the right MCA territory, compatible with a combination of encephalomalacia and gliosis. Mild cerebral atrophy. Patchy and confluent areas of decreased attenuation are noted throughout the deep and periventricular white matter of the cerebral hemispheres bilaterally, compatible with chronic microvascular ischemic disease. No evidence of acute infarction, hemorrhage, hydrocephalus, extra-axial collection or mass lesion/mass effect. Vascular: No hyperdense vessel or unexpected calcification. Skull: Normal. Negative for fracture or focal lesion. Sinuses/Orbits: No acute finding. Other: None. IMPRESSION: 1. No acute intracranial abnormalities. 2. The appearance in of the brain is very similar to the prior study, with mild cerebral atrophy, extensive chronic  microvascular ischemic changes in the cerebral white matter, and old right MCA territory infarction, as above. Electronically Signed   By: Vinnie Langton M.D.   On: 04/02/2018 15:35    Procedures Procedures (including critical care time)  Medications Ordered in ED Medications - No data to display   Initial Impression / Assessment and Plan / ED Course  I have reviewed the triage vital signs and the nursing notes.  Pertinent labs & imaging results that were available during my care of the patient were reviewed by me and considered in my medical decision making (see chart for details).     Patient, with past medical history of hypertension, hyperlipidemia, atrial fibrillation currently on Eliquis, stage III CKD presents to ED for evaluation of memory loss/hallucinations.  Patient resides at home along with his puppy.  He is unsure why he is here.  EMS states that he went to the fire department concerned about his memory loss for a few weeks.  I spoke to his daughter [name] who lives in [place].  She states that he has been having these hallucinations and forgetfulness since February 2019.  She states that he has had his power and water cut off because he has been forgetting to pay the bills.  She will state that he will have visual and auditory hallucinations.  Patient is currently denying any AVH, SI or HI.  He states that he feels "100% great."  Medical screening lab work including CBC, CMP, salicylate and acetaminophen level are unremarkable.  Chest x-ray unremarkable.  CT of the head unremarkable.  [facility] counselor states that she does not believe patient will meet inpatient criteria.  I spoke to patient's daughter again.  She states that patient will not agree to any assistance at home or placement in a facility.  Patient continues to feel like he does not need to be here and does not need any help.  His daughter states that he has a court date next week for determining decision-making capacity or  transfer of HPOA to another guardian.  Patient remains in NAD during his ED visit.  He has asked multiple times if he could leave.  Patient not willing to stay for remainder of work-up.  Portions of this note were generated with Lobbyist. Dictation errors may occur despite best attempts at proofreading.   Final Clinical Impressions(s) / ED Diagnoses   Final diagnoses:  Memory deficit    ED Discharge Orders    None       Delia Heady, PA-C 04/02/18 2007    Drenda Freeze, MD 04/04/18 [phone]

## 2018-04-02 NOTE — [facility] Counselor (Signed)
[name], informed TTS writer per nurse per nurse patient can be seen in room 26. Report patient is hard of hearing.

## 2018-04-09 ENCOUNTER — Encounter (HOSPITAL_COMMUNITY): Payer: Self-pay | Admitting: *Deleted

## 2018-04-09 ENCOUNTER — Emergency Department (HOSPITAL_COMMUNITY)
Admission: EM | Admit: 2018-04-09 | Discharge: 2018-04-10 | Disposition: A | Discharge status: Home / Self Care | Payer: Medicare Other | Attending: Emergency Medicine | Admitting: Emergency Medicine

## 2018-04-09 ENCOUNTER — Other Ambulatory Visit: Payer: Self-pay

## 2018-04-09 DIAGNOSIS — I129 Hypertensive chronic kidney disease with stage 1 through stage 4 chronic kidney disease, or unspecified chronic kidney disease: Secondary | ICD-10-CM | POA: Diagnosis not present

## 2018-04-09 DIAGNOSIS — R410 Disorientation, unspecified: Secondary | ICD-10-CM | POA: Diagnosis not present

## 2018-04-09 DIAGNOSIS — Z96653 Presence of artificial knee joint, bilateral: Secondary | ICD-10-CM | POA: Diagnosis not present

## 2018-04-09 DIAGNOSIS — Z046 Encounter for general psychiatric examination, requested by authority: Secondary | ICD-10-CM | POA: Diagnosis not present

## 2018-04-09 DIAGNOSIS — Z79899 Other long term (current) drug therapy: Secondary | ICD-10-CM | POA: Diagnosis not present

## 2018-04-09 DIAGNOSIS — G3184 Mild cognitive impairment, so stated: Secondary | ICD-10-CM | POA: Diagnosis not present

## 2018-04-09 DIAGNOSIS — N183 Chronic kidney disease, stage 3 (moderate): Secondary | ICD-10-CM | POA: Diagnosis not present

## 2018-04-09 DIAGNOSIS — R4182 Altered mental status, unspecified: Secondary | ICD-10-CM | POA: Diagnosis not present

## 2018-04-09 DIAGNOSIS — R443 Hallucinations, unspecified: Secondary | ICD-10-CM | POA: Insufficient documentation

## 2018-04-09 DIAGNOSIS — R258 Other abnormal involuntary movements: Secondary | ICD-10-CM | POA: Diagnosis not present

## 2018-04-09 LAB — ETHANOL: Alcohol, Ethyl (B): <10 mg/dL (ref <10)

## 2018-04-09 LAB — URINALYSIS, ROUTINE W REFLEX MICROSCOPIC
Bilirubin Urine: NEGATIVE
Glucose, UA: NEGATIVE mg/dL
Hgb urine dipstick: NEGATIVE
Ketones, ur: NEGATIVE mg/dL
Nitrite: NEGATIVE
Protein, ur: NEGATIVE mg/dL
Specific Gravity, Urine: 1.017 (ref 1.005–1.030)
pH: 5 (ref 5.0–8.0)

## 2018-04-09 LAB — RAPID URINE DRUG SCREEN, HOSP PERFORMED
Amphetamines: NOT DETECTED
Benzodiazepines: NOT DETECTED
Cocaine: NOT DETECTED
Opiates: NOT DETECTED
Tetrahydrocannabinol: NOT DETECTED

## 2018-04-09 LAB — CBC WITH DIFFERENTIAL/PLATELET
Basophils Absolute: 0 10*3/uL (ref 0.0–0.1)
Basophils Relative: 0 %
Eosinophils Absolute: 0.1 10*3/uL (ref 0.0–0.7)
Eosinophils Relative: 1 %
HCT: 40.3 % (ref 39.0–52.0)
Hemoglobin: 13.3 g/dL (ref 13.0–17.0)
Lymphocytes Relative: 28 %
Lymphs Abs: 1.6 10*3/uL (ref 0.7–4.0)
MCH: 31.4 pg (ref 26.0–34.0)
MCHC: 33 g/dL (ref 30.0–36.0)
MCV: 95.3 fL (ref 78.0–100.0)
Monocytes Absolute: 0.4 10*3/uL (ref 0.1–1.0)
Monocytes Relative: 7 %
Neutro Abs: 3.5 10*3/uL (ref 1.7–7.7)
Neutrophils Relative %: 64 %
Platelets: 193 10*3/uL (ref 150–400)
RBC: 4.23 MIL/uL (ref 4.22–5.81)
RDW: 13 % (ref 11.5–15.5)
WBC: 5.5 10*3/uL (ref 4.0–10.5)

## 2018-04-09 LAB — COMPREHENSIVE METABOLIC PANEL
ALT: 12 U/L — ABNORMAL LOW (ref 17–63)
AST: 23 U/L (ref 15–41)
Albumin: 3.6 g/dL (ref 3.5–5.0)
Alkaline Phosphatase: 66 U/L (ref 38–126)
Anion gap: 7 (ref 5–15)
BUN: 34 mg/dL — ABNORMAL HIGH (ref 6–20)
CO2: 25 mmol/L (ref 22–32)
Calcium: 9.2 mg/dL (ref 8.9–10.3)
Chloride: 107 mmol/L (ref 101–111)
Creatinine, Ser: 1.46 mg/dL — ABNORMAL HIGH (ref 0.61–1.24)
GFR calc Af Amer: 46 mL/min — ABNORMAL LOW (ref >60)
GFR calc non Af Amer: 39 mL/min — ABNORMAL LOW (ref >60)
Glucose, Bld: 96 mg/dL (ref 65–99)
Potassium: 4.4 mmol/L (ref 3.5–5.1)
Sodium: 139 mmol/L (ref 135–145)
Total Bilirubin: 0.9 mg/dL (ref 0.3–1.2)
Total Protein: 6.8 g/dL (ref 6.5–8.1)

## 2018-04-09 LAB — ACETAMINOPHEN LEVEL: Acetaminophen (Tylenol), Serum: <10 ug/mL — ABNORMAL LOW (ref 10–30)

## 2018-04-09 LAB — SALICYLATE LEVEL: Salicylate Lvl: <7 mg/dL (ref 2.8–30.0)

## 2018-04-09 NOTE — ED Triage Notes (Signed)
04/09/18  1825  Patient was brought in by police with IVC papers.  Pt's daughter took out IVC papers stating pt is not taking care of himself, losing weight, not eating, not bathing. Pt states he does not know why he is here.

## 2018-04-09 NOTE — BH Assessment (Signed)
Tele Assessment Note   Patient Name: [name] MRN: [id number] Referring Physician: Dr. Dene Gentry Location of Patient: [facility] Location of Provider: [place] is an 84 y.o. male.  -Clinician reviewed note by Dr. Francia Greaves.  84 year old male with prior history of CVA, hypertension, hyperlipidemia, CKD, and A. Fib arrives with IVC papers.  Patient's IVC papers alleged that the patient is not taking care of himself, not bathing, not eating, and having hallucinations.  Patient apparently lives by himself.  The patient reports that he has two daughters in [place] - one of whom appears to have taken out the IVC papers.  Patient says that he does live alone.  He is not sure of who it was that brought him to the hospital.  He does not recall being at [facility] last week (04-02-18).  Patient says he can take care of himself.  He can do his ADLs unassisted.  Patient says he drives himself out to go to eat at restaurants occasionally. He says he had no trouble driving "and if I did I would stop driving."    Patient denies any current or recurrent SI or HI.  Patient also denies seeing things or hearing things.  Clinician asked specifically about whether he would see or hear things in his home and patient responded "no."  Patient did give verbal permission to contact his daughters.  Clinician did attempt to call the petitioner but there was no response.  Patient presents differently tonight than he did on 04-02-18 when assesed by TTS.  Please check Epic for that note.    -Clinician did discuss patient care with Lindon Romp, FNP.  He recommended psychiatry complete 1st opinion and have CSW consult in AM.  Clinician did talk to Dr. Francia Greaves who put in a CSW consult also.   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 Atrial fibrillation ([place])   . CAP (community acquired  pneumonia) 02/16/2018  . Chronic a-fib ([place])    Archie Endo 09/20/2017  . CKD (chronic kidney disease), stage III ([place])    Archie Endo 02/17/2018  . History of hiatal hernia    Archie Endo 09/20/2017  . Hyperlipidemia   . Hypertension   . Stroke ([place]) 04/20/2008   Archie Endo 01/23/2010  . Stroke [facility]) 11/2013   Large posterior and inferior right MCA stroke/infarct/notes 12/22/2013    Past Surgical History:  Procedure Laterality Date  . ANAL FISTULOTOMY  10/2006   Archie Endo 03/13/2011  . JOINT REPLACEMENT    . ROTATOR CUFF REPAIR Left 08/1995   Archie Endo 02/27/2011  . TOTAL KNEE ARTHROPLASTY Right 03/2007   Archie Endo 02/27/2011  . TOTAL KNEE ARTHROPLASTY Left 03/2009   Archie Endo 02/27/2011    Family History:  Family History  Problem Relation Age of Onset  . Cancer Mother     Social History:  reports that he has never smoked. He has never used smokeless tobacco. He reports that he does not drink alcohol or use drugs.  Additional Social History:  Alcohol / Drug Use Pain Medications: Pt says no meds Prescriptions: Pt has no prescriptions Over the Counter: ASA History of alcohol / drug use?: No history of alcohol / drug abuse  CIWA: CIWA-Ar BP: 134/79 Pulse Rate: 98 COWS:    Allergies:  Allergies  Allergen Reactions  . Codeine Nausea And Vomiting    Home Medications:  (Not in a hospital admission)  OB/GYN Status:  No LMP for male patient.  General Assessment Data Location of Assessment: [facility] ED TTS Assessment: In system Is this a Tele or Face-to-Face Assessment?: Tele Assessment Is this an Initial Assessment or a Re-assessment for this encounter?: Initial Assessment Marital status: Widowed Is patient pregnant?: No Pregnancy Status: No Living Arrangements: Alone Can pt return to current living arrangement?: Yes Admission Status: Involuntary Is patient capable of signing voluntary admission?: No Referral Source: Self/Family/Friend(Family member filed IVC.) Insurance type: [facility]     Crisis  Care Plan Living Arrangements: Alone Name of Psychiatrist: None Name of Therapist: None  Education Status Is patient currently in school?: No Is the patient employed, unemployed or receiving disability?: Unemployed(Retired)  Risk to self with the past 6 months Suicidal Ideation: No Has patient been a risk to self within the past 6 months prior to admission? : No Suicidal Intent: No Has patient had any suicidal intent within the past 6 months prior to admission? : No Is patient at risk for suicide?: No Suicidal Plan?: No Has patient had any suicidal plan within the past 6 months prior to admission? : No Access to Means: No What has been your use of drugs/alcohol within the last 12 months?: Does not use Previous Attempts/Gestures: No How many times?: 0 Other Self Harm Risks: None Triggers for Past Attempts: None known Intentional Self Injurious Behavior: None Family Suicide History: No Recent stressful life event(s): Other (Comment)(Daughters concerned about him.) Persecutory voices/beliefs?: No Depression: No Depression Symptoms: (Denies depressive symptoms) Substance abuse history and/or treatment for substance abuse?: No Suicide prevention information given to non-admitted patients: Not applicable  Risk to Others within the past 6 months Homicidal Ideation: No Does patient have any lifetime risk of violence toward others beyond the six months prior to admission? : No Thoughts of Harm to Others: No Current Homicidal Intent: No Current Homicidal Plan: No Access to Homicidal Means: No Identified Victim: No one History of harm to others?: No Assessment of Violence: None Noted Violent Behavior Description: None reported Does patient have access to weapons?: No Criminal Charges Pending?: No Does patient have a court date: No Is patient on probation?: No  Psychosis Hallucinations: None noted Delusions: None noted  Mental Status Report Appearance/Hygiene: Unremarkable,  In scrubs Eye Contact: Good Motor Activity: Freedom of movement, Unremarkable Speech: Logical/coherent Level of Consciousness: Alert Mood: Pleasant Affect: Appropriate to circumstance Anxiety Level: None Thought Processes: Coherent, Relevant Judgement: Unimpaired Orientation: Person, Place, Situation Obsessive Compulsive Thoughts/Behaviors: None  Cognitive Functioning Concentration: Normal Memory: Recent Impaired, Remote Impaired Is patient IDD: No Is patient DD?: No Insight: Good Impulse Control: Good Appetite: Good Have you had any weight changes? : No Change Sleep: No Change Total Hours of Sleep: 8 Vegetative Symptoms: None  ADLScreening [facility] Assessment Services) Patient's cognitive ability adequate to safely complete daily activities?: Yes Patient able to express need for assistance with ADLs?: Yes Independently performs ADLs?: Yes (appropriate for developmental age)  Prior Inpatient Therapy Prior Inpatient Therapy: No  Prior Outpatient Therapy Prior Outpatient Therapy: No Does patient have an ACCT team?: No Does patient have Intensive In-House Services?  : No Does patient have [facility] services? : No Does patient have P4CC services?: No  ADL Screening (condition at time of admission) Patient's cognitive ability adequate to safely complete daily activities?: Yes Is the patient deaf or have difficulty hearing?: Yes Does the patient have difficulty seeing, even when wearing glasses/contacts?: No Does the patient have difficulty concentrating, remembering, or making decisions?: Yes Patient able to express need  for assistance with ADLs?: Yes Does the patient have difficulty dressing or bathing?: No Independently performs ADLs?: Yes (appropriate for developmental age) Does the patient have difficulty walking or climbing stairs?: No Weakness of Legs: None Weakness of Arms/Hands: None       Abuse/Neglect Assessment (Assessment to be complete while patient is  alone) Physical Abuse: Denies Verbal Abuse: Denies Sexual Abuse: Denies Exploitation of patient/patient's resources: Denies Self-Neglect: Denies     Regulatory affairs officer (For Healthcare) Does Patient Have a Medical Advance Directive?: No Would patient like information on creating a medical advance directive?: No - Patient declined Nutrition Screen- [facility] Adult/[facility] Patient's home diet: Regular        Disposition:  Disposition Initial Assessment Completed for this Encounter: Yes Patient referred to: Other (Comment)(To be reviewed by psychiatry in AM for 1st opinion)  This service was provided via telemedicine using a 2-way, interactive audio and video technology.  Names of all persons participating in this telemedicine service and their role in this encounter. Name:  Role:   Name:  Role:   Name:  Role:   Name:  Role:     Raymondo Band 04/09/2018 10:35 PM

## 2018-04-09 NOTE — ED Notes (Signed)
Pt awake, alert & responsive, no distress noted, calm at present.  Sitter at bedside.  Comfort measures in progress at present, taking shower at present, accomp. By sitter.  Monitoring for safety.

## 2018-04-09 NOTE — ED Notes (Signed)
Bed: WA30 Expected date:  Expected time:  Means of arrival:  Comments: [employer]-IVC 

## 2018-04-09 NOTE — ED Provider Notes (Signed)
[facility] DEPT Provider Note   CSN: [id number] Arrival date & time: 04/09/18  1811     History   Chief Complaint Chief Complaint  Patient presents with  . Altered Mental Status    HPI [name] is a 84 y.o. male.  84 year old male with prior history of CVA, hypertension, hyperlipidemia, CKD, and A. Fib arrives with IVC papers.  Patient's IVC papers alleged that the patient is not taking care of himself, not bathing, not eating, and having hallucinations.  Patient apparently lives by himself.  The patient reports that he has two daughters in [place] - one of whom appears to have taken out the IVC papers.  Of note, patient was seen at this facility last week for a similar complaint.  He was voluntary at that time.  He ended up leaving AMA.  Patient reports that he cannot recall this visit.  The history is provided by the patient and medical records.  Altered Mental Status   Chronicity: unclear. The current episode started more than 1 week ago. The problem has not changed since onset.Associated symptoms include confusion and hallucinations.    Past Medical History:  Diagnosis Date  . Atrial fibrillation ([place])   . CAP (community acquired pneumonia) 02/16/2018  . Chronic a-fib ([place])    Archie Endo 09/20/2017  . CKD (chronic kidney disease), stage III ([place])    Archie Endo 02/17/2018  . History of hiatal hernia    Archie Endo 09/20/2017  . Hyperlipidemia   . Hypertension   . Stroke ([place]) 04/20/2008   Archie Endo 01/23/2010  . Stroke [facility]) 11/2013   Large posterior and inferior right MCA stroke/infarct/notes 12/22/2013    Patient Active Problem List   Diagnosis Date Noted  . CAP (community acquired pneumonia) 02/17/2018  . CKD (chronic kidney disease), stage III ([place]) 02/17/2018  . Rib pain on right side 09/20/2017  . Chest pain, pleuritic 09/20/2017  . Abdominal pain 09/17/2017  . Hyperlipidemia 09/17/2017  . Hypertension 09/17/2017  . Anemia 09/17/2017   . Atrial fibrillation, chronic ([place]) 11/29/2013  . CVA (cerebral infarction) 11/29/2013  . OTHER VITAMIN B12 DEFICIENCY ANEMIA 01/08/2008  . ANAL FISTULA 01/08/2008    Past Surgical History:  Procedure Laterality Date  . ANAL FISTULOTOMY  10/2006   Archie Endo 03/13/2011  . JOINT REPLACEMENT    . ROTATOR CUFF REPAIR Left 08/1995   Archie Endo 02/27/2011  . TOTAL KNEE ARTHROPLASTY Right 03/2007   Archie Endo 02/27/2011  . TOTAL KNEE ARTHROPLASTY Left 03/2009   Archie Endo 02/27/2011        Home Medications    Prior to Admission medications   Medication Sig Start Date End Date Taking? Authorizing Provider  apixaban (ELIQUIS) 2.5 MG TABS tablet Take 1 tablet (2.5 mg total) by mouth 2 (two) times daily. 02/17/18   Geradine Girt, DO  doxycycline (VIBRA-TABS) 100 MG tablet Take 1 tablet (100 mg total) by mouth every 12 (twelve) hours. Patient not taking: Reported on 04/02/2018 02/17/18   Geradine Girt, DO  pantoprazole (PROTONIX) 40 MG tablet Take 1 tablet (40 mg total) by mouth 2 (two) times daily. Patient not taking: Reported on 04/02/2018 09/20/17   Charlynne Cousins, MD  pravastatin (PRAVACHOL) 20 MG tablet Take 1 tablet (20 mg total) by mouth at bedtime. Patient not taking: Reported on 04/02/2018 12/10/13   Cathlyn Parsons, PA-C    Family History Family History  Problem Relation Age of Onset  . Cancer Mother     Social History Social History  Tobacco Use  . Smoking status: Never Smoker  . Smokeless tobacco: Never Used  Substance Use Topics  . Alcohol use: No  . Drug use: No     Allergies   Codeine   Review of Systems Review of Systems  Psychiatric/Behavioral: Positive for confusion and hallucinations.  All other systems reviewed and are negative.    Physical Exam Updated Vital Signs BP 126/69 (BP Location: Left Arm)   Pulse 69   Temp 97.8 F (36.6 C) (Oral)   Resp 16   SpO2 97%   Physical Exam  Constitutional: He appears well-developed and well-nourished. No  distress.  Pleasantly confused    HENT:  Head: Normocephalic and atraumatic.  Mouth/Throat: Oropharynx is clear and moist.  Eyes: Pupils are equal, round, and reactive to light. Conjunctivae and EOM are normal.  Neck: Normal range of motion. Neck supple.  Cardiovascular: Normal rate, regular rhythm and normal heart sounds.  Pulmonary/Chest: Effort normal and breath sounds normal. No respiratory distress.  Abdominal: Soft. He exhibits no distension. There is no tenderness.  Musculoskeletal: Normal range of motion. He exhibits no edema or deformity.  Neurological: He is alert.  Alert Oriented to person and place  No facial droop  Normal speech   5/5 strength to BUE/BLE  Skin: Skin is warm and dry.  Psychiatric: He has a normal mood and affect.  Nursing note and vitals reviewed.    ED Treatments / Results  Labs (all labs ordered are listed, but only abnormal results are displayed) Labs Reviewed  ACETAMINOPHEN LEVEL - Abnormal; Notable for the following components:      Result Value   Acetaminophen (Tylenol), Serum <10 (*)    All other components within normal limits  COMPREHENSIVE METABOLIC PANEL - Abnormal; Notable for the following components:   BUN 34 (*)    Creatinine, Ser 1.46 (*)    ALT 12 (*)    GFR calc non Af Amer 39 (*)    GFR calc Af Amer 46 (*)    All other components within normal limits  ETHANOL  SALICYLATE LEVEL  CBC WITH DIFFERENTIAL/PLATELET  URINALYSIS, ROUTINE W REFLEX MICROSCOPIC  RAPID URINE DRUG SCREEN, HOSP PERFORMED    EKG EKG Interpretation  Date/Time:  Thursday April 09 2018 20:07:00 EDT Ventricular Rate:  108 PR Interval:    QRS Duration: 92 QT Interval:  312 QTC Calculation: 418 R Axis:   76 Text Interpretation:  Atrial fibrillation with rapid ventricular response Minimal voltage criteria for LVH, may be normal variant Abnormal ECG Confirmed by Dene Gentry [phone]) on 04/09/2018 8:42:40 PM   Radiology No results  found.  Procedures Procedures (including critical care time)  Medications Ordered in ED Medications - No data to display   Initial Impression / Assessment and Plan / ED Course  I have reviewed the triage vital signs and the nursing notes.  Pertinent labs & imaging results that were available during my care of the patient were reviewed by me and considered in my medical decision making (see chart for details).     MDM  Screen complete  Patient is presenting for evaluation of unsafe behaviors, poor self-care, and is currently on IVC papers. Screening medical labs do not reveal significant acute pathology.   Patient requires TTS/Psych evaluation.   He is medically clear at this time for TTS/Psych evaluation.    Final Clinical Impressions(s) / ED Diagnoses   Final diagnoses:  Involuntary commitment    ED Discharge Orders    None  Valarie Merino, MD 04/09/18 2148

## 2018-04-10 DIAGNOSIS — Z046 Encounter for general psychiatric examination, requested by authority: Secondary | ICD-10-CM

## 2018-04-10 NOTE — Progress Notes (Addendum)
Discharge is on hold per Education officer, museum until the [employer] and Education officer, museum have a conversation.

## 2018-04-10 NOTE — Progress Notes (Addendum)
UPDATE 3:45PM: CSW met with patients daughters; Jan and [place]; [facility] Tammy; and Higinio Roger, to discuss disposition plans. Patients daughter, Jan, recently became legal guardian and voiced concerns with patient discharging back home. Both daughters currently live in [place] and are only able to assist patient while in [place]. Daughters state they were informed by DSS to bring patient to ED for placement into memory care. Patient currently does not have Medicaid and patient/ family are unable to pay privately at this time. CSW and other parties had lengthy conversation regarding next steps and discharge plans for today. Daughters do not fell comfortable with transporting patient home in their vehicle at this time and contacted patients friend JR. For assistance. Daughters are to continue following up with DSS for assistance with placement from the community. CSW completed FL2 and family can request copy from medical records if needed. CSW has documented guardianship in [place].   CSW spoke with patient via bedside to discuss disposition planning. Patient is hard of hearing. Patient voices no concerns at this time and states he lives at home alone. Patient states he typically drives himself however is able to call a friend to pick him up from [facility]. Patient was able to provide current address on file and requested to contact his daughter, Blair Promise, to let her know he is being discharged. Patient was evaluated by RN CM for [facility] services however patient declined any services.    Kingsley Spittle, [facility] Emergency Room Clinical Social Worker [phone]

## 2018-04-10 NOTE — BH Assessment (Addendum)
[facility] Assessment Progress Note  Per Buford Dresser, DO, this pt does not require psychiatric hospitalization at this time.  Pt presents under IVC initiated by pt's daughter, which Dr Mariea Clonts has rescinded.  Pt is to be discharged from [facility].  No behavioral health referrals are required, however, pt would benefit from consulting with nurse case management to address possible home health needs.  Dr Mariea Clonts will order consult.  Pt's nurse has been notified.  Jalene Mullet, [place] Triage Specialist [phone]

## 2018-04-10 NOTE — Care Management Note (Signed)
Case Management Note  CM consulted for [facility] services.  CM noted pt had refused [facility] on 02/19/2018.  CM spoke with pt who again declined [facility] stating "I don't need it."  CM updated [facility] team.  No further CM needs noted at this time.

## 2018-04-10 NOTE — [facility] Suicide Risk Assessment (Cosign Needed)
Suicide Risk Assessment  Discharge Assessment   [facility] Discharge Suicide Risk Assessment   Principal Problem: Involuntary commitment Discharge Diagnoses:  Patient Active Problem List   Diagnosis Date Noted  . Involuntary commitment [Z04.6]   . CAP (community acquired pneumonia) [J18.9] 02/17/2018  . CKD (chronic kidney disease), stage III ([place]) [N18.3] 02/17/2018  . Rib pain on right side [R07.81] 09/20/2017  . Chest pain, pleuritic [R07.81] 09/20/2017  . Abdominal pain [R10.9] 09/17/2017  . Hyperlipidemia [E78.5] 09/17/2017  . Hypertension [I10] 09/17/2017  . Anemia [D64.9] 09/17/2017  . Atrial fibrillation, chronic ([place]) [I48.2] 11/29/2013  . CVA (cerebral infarction) [I63.9] 11/29/2013  . OTHER VITAMIN B12 DEFICIENCY ANEMIA [D51.8] 01/08/2008  . ANAL FISTULA [K60.3] 01/08/2008    Total Time spent with patient: 45 minutes  Musculoskeletal: Strength & Muscle Tone: within normal limits Gait & Station: normal Patient leans: N/A  Psychiatric Specialty Exam: Physical Exam  Constitutional: He is oriented to person, place, and time. He appears well-developed and well-nourished.  HENT:  Head: Normocephalic.  Respiratory: Effort normal.  Musculoskeletal: Normal range of motion.  Neurological: He is alert and oriented to person, place, and time.  Psychiatric: He has a normal mood and affect. His speech is normal and behavior is normal. Judgment and thought content normal. Cognition and memory are normal.   Review of Systems  Psychiatric/Behavioral: Positive for memory loss (mild). Negative for depression, hallucinations, substance abuse and suicidal ideas. The patient is not nervous/anxious and does not have insomnia.   All other systems reviewed and are negative.  Blood pressure 111/82, pulse (!) 54, temperature (!) 97.4 F (36.3 C), temperature source Oral, resp. rate 16, SpO2 98 %.There is no height or weight on file to calculate BMI. General Appearance: Casual Eye Contact:   Good Speech:  Clear and Coherent Volume:  Normal Mood:  Euthymic Affect:  Congruent Thought Process:  Coherent, Goal Directed and Linear Orientation:  Full (Time, Place, and Person) Thought Content:  Logical Suicidal Thoughts:  No Homicidal Thoughts:  No Memory:  Immediate;   Good Recent;   Good Remote;   Fair Judgement:  Good Insight:  Good Psychomotor Activity:  Normal Concentration:  Concentration: Good and Attention Span: Good Recall:  Good Fund of Knowledge:  Good Language:  Good Akathisia:  No Handed:  Right AIMS (if indicated):    Assets:  Agricultural consultant Housing ADL's:  Intact Cognition:  WNL Sleep:   Good   Mental Status Per Nursing Assessment::   On Admission:   Involuntary Committment  Demographic Factors:  Male, Age 29 or older, Caucasian and Living alone  Loss Factors: NA  Historical Factors: Impulsivity  Risk Reduction Factors:   Sense of responsibility to family  Continued Clinical Symptoms:  Depression:   Impulsivity  Cognitive Features That Contribute To Risk:  Closed-mindedness    Suicide Risk:  Minimal: No identifiable suicidal ideation.  Patients presenting with no risk factors but with morbid ruminations; may be classified as minimal risk based on the severity of the depressive symptoms    Plan Of Care/Follow-up recommendations:  Activity:  as tolerated Diet:  Heart Healthy  Ethelene Hal, NP 04/10/2018, 12:06 PM

## 2018-04-10 NOTE — Consult Note (Addendum)
[facility] Consult   Reason for Consult:  Involuntary Committment Referring Physician:  EDP Patient Identification: [name] MRN:  [id number] Principal Diagnosis: Involuntary commitment Diagnosis:   Patient Active Problem List   Diagnosis Date Noted  . Involuntary commitment [Z04.6]   . CAP (community acquired pneumonia) [J18.9] 02/17/2018  . CKD (chronic kidney disease), stage III ([place]) [N18.3] 02/17/2018  . Rib pain on right side [R07.81] 09/20/2017  . Chest pain, pleuritic [R07.81] 09/20/2017  . Abdominal pain [R10.9] 09/17/2017  . Hyperlipidemia [E78.5] 09/17/2017  . Hypertension [I10] 09/17/2017  . Anemia [D64.9] 09/17/2017  . Atrial fibrillation, chronic ([place]) [I48.2] 11/29/2013  . CVA (cerebral infarction) [I63.9] 11/29/2013  . OTHER VITAMIN B12 DEFICIENCY ANEMIA [D51.8] 01/08/2008  . ANAL FISTULA [K60.3] 01/08/2008    Total Time spent with patient: 45 minutes  Subjective:   [name] is a 84 y.o. male patient admitted under involuntary commitment.  HPI:  Pt was seen and chart reviewed with treatment team and Dr Mariea Clonts. Pt denies suicidal/homicidal ideation, denies auditory/visual hallucinations and does not appear to be responding to internal stimuli. Pt stated he lives alone with his dog and is still driving. He stated he sleeps well and eats at various restaurants around town. Pt stated he has two daughters who live in [place]. Pt is alert and oriented x 4. Pt is stable and psychiatrically clear for discharge.   Past Psychiatric History: As above  Risk to Self: None Risk to Others: None Prior Inpatient Therapy: Prior Inpatient Therapy: No Prior Outpatient Therapy: Prior Outpatient Therapy: No Does patient have an ACCT team?: No Does patient have Intensive In-House Services?  : No Does patient have [facility] services? : No Does patient have P4CC services?: No  Past Medical History:  Past Medical History:  Diagnosis Date  . Atrial  fibrillation ([place])   . CAP (community acquired pneumonia) 02/16/2018  . Chronic a-fib ([place])    Archie Endo 09/20/2017  . CKD (chronic kidney disease), stage III ([place])    Archie Endo 02/17/2018  . History of hiatal hernia    Archie Endo 09/20/2017  . Hyperlipidemia   . Hypertension   . Stroke ([place]) 04/20/2008   Archie Endo 01/23/2010  . Stroke [facility] 11/2013   Large posterior and inferior right MCA stroke/infarct/notes 12/22/2013    Past Surgical History:  Procedure Laterality Date  . ANAL FISTULOTOMY  10/2006   Archie Endo 03/13/2011  . JOINT REPLACEMENT    . ROTATOR CUFF REPAIR Left 08/1995   Archie Endo 02/27/2011  . TOTAL KNEE ARTHROPLASTY Right 03/2007   Archie Endo 02/27/2011  . TOTAL KNEE ARTHROPLASTY Left 03/2009   Archie Endo 02/27/2011   Family History:  Family History  Problem Relation Age of Onset  . Cancer Mother    Family Psychiatric  History: Unknown Social History:  Social History   Substance and Sexual Activity  Alcohol Use No     Social History   Substance and Sexual Activity  Drug Use No    Social History   Socioeconomic History  . Marital status: Widowed    Spouse name: Not on file  . Number of children: Not on file  . Years of education: Not on file  . Highest education level: Not on file  Occupational History  . Not on file  Social Needs  . Financial resource strain: Not on file  . Food insecurity:    Worry: Not on file    Inability: Not on file  . Transportation needs:    Medical: Not on file  Non-medical: Not on file  Tobacco Use  . Smoking status: Never Smoker  . Smokeless tobacco: Never Used  Substance and Sexual Activity  . Alcohol use: No  . Drug use: No  . Sexual activity: Not Currently  Lifestyle  . Physical activity:    Days per week: Not on file    Minutes per session: Not on file  . Stress: Not on file  Relationships  . Social connections:    Talks on phone: Not on file    Gets together: Not on file    Attends religious service: Not on file    Active  member of club or organization: Not on file    Attends meetings of clubs or organizations: Not on file    Relationship status: Not on file  Other Topics Concern  . Not on file  Social History Narrative  . Not on file   Additional Social History: N/A    Allergies:   Allergies  Allergen Reactions  . Codeine Nausea And Vomiting    Labs:  Results for orders placed or performed during the hospital encounter of 04/09/18 (from the past 48 hour(s))  Acetaminophen level     Status: Abnormal   Collection Time: 04/09/18  7:17 PM  Result Value Ref Range   Acetaminophen (Tylenol), Serum <10 (L) 10 - 30 ug/mL    Comment: (NOTE) Therapeutic concentrations vary significantly. A range of 10-30 ug/mL  may be an effective concentration for many patients. However, some  are best treated at concentrations outside of this range. Acetaminophen concentrations >150 ug/mL at 4 hours after ingestion  and >50 ug/mL at 12 hours after ingestion are often associated with  toxic reactions. Performed at [facility], [place] [address] [place], [place] [address]   Comprehensive metabolic panel     Status: Abnormal   Collection Time: 04/09/18  7:17 PM  Result Value Ref Range   Sodium 139 135 - 145 mmol/L   Potassium 4.4 3.5 - 5.1 mmol/L   Chloride 107 101 - 111 mmol/L   CO2 25 22 - 32 mmol/L   Glucose, Bld 96 65 - 99 mg/dL   BUN 34 (H) 6 - 20 mg/dL   Creatinine, Ser 1.46 (H) 0.61 - 1.24 mg/dL   Calcium 9.2 8.9 - 10.3 mg/dL   Total Protein 6.8 6.5 - 8.1 g/dL   Albumin 3.6 3.5 - 5.0 g/dL   AST 23 15 - 41 U/L   ALT 12 (L) 17 - 63 U/L   Alkaline Phosphatase 66 38 - 126 U/L   Total Bilirubin 0.9 0.3 - 1.2 mg/dL   GFR calc non Af Amer 39 (L) >60 mL/min   GFR calc Af Amer 46 (L) >60 mL/min    Comment: (NOTE) The eGFR has been calculated using the CKD EPI equation. This calculation has not been validated in all clinical situations. eGFR's persistently <60 mL/min signify possible Chronic  Kidney Disease.    Anion gap 7 5 - 15    Comment: Performed at [facility], [place] [address], [place], [place] [address]  Ethanol     Status: None   Collection Time: 04/09/18  7:17 PM  Result Value Ref Range   Alcohol, Ethyl (B) <10 <10 mg/dL    Comment: (NOTE) Lowest detectable limit for serum alcohol is 10 mg/dL. For medical purposes only. Performed at [facility], [place] [address] [place], [place] [address]   Salicylate level     Status: None  Collection Time: 04/09/18  7:17 PM  Result Value Ref Range   Salicylate Lvl <0.2 2.8 - 30.0 mg/dL    Comment: Performed at [facility], [place] [address] [place], [place] [address]  CBC with Differential     Status: None   Collection Time: 04/09/18  7:17 PM  Result Value Ref Range   WBC 5.5 4.0 - 10.5 K/uL   RBC 4.23 4.22 - 5.81 MIL/uL   Hemoglobin 13.3 13.0 - 17.0 g/dL   HCT 40.3 39.0 - 52.0 %   MCV 95.3 78.0 - 100.0 fL   MCH 31.4 26.0 - 34.0 pg   MCHC 33.0 30.0 - 36.0 g/dL   RDW 13.0 11.5 - 15.5 %   Platelets 193 150 - 400 K/uL   Neutrophils Relative % 64 %   Neutro Abs 3.5 1.7 - 7.7 K/uL   Lymphocytes Relative 28 %   Lymphs Abs 1.6 0.7 - 4.0 K/uL   Monocytes Relative 7 %   Monocytes Absolute 0.4 0.1 - 1.0 K/uL   Eosinophils Relative 1 %   Eosinophils Absolute 0.1 0.0 - 0.7 K/uL   Basophils Relative 0 %   Basophils Absolute 0.0 0.0 - 0.1 K/uL    Comment: Performed at [facility], [place] [address], [place], [place] [address]  Urinalysis, Routine w reflex microscopic     Status: Abnormal   Collection Time: 04/09/18 10:42 PM  Result Value Ref Range   Color, Urine YELLOW YELLOW   APPearance CLEAR CLEAR   Specific Gravity, Urine 1.017 1.005 - 1.030   pH 5.0 5.0 - 8.0   Glucose, UA NEGATIVE NEGATIVE mg/dL   Hgb urine dipstick NEGATIVE NEGATIVE   Bilirubin Urine NEGATIVE NEGATIVE   Ketones, ur NEGATIVE NEGATIVE mg/dL   Protein, ur NEGATIVE NEGATIVE  mg/dL   Nitrite NEGATIVE NEGATIVE   Leukocytes, UA TRACE (A) NEGATIVE   RBC / HPF 0-5 0 - 5 RBC/hpf   WBC, UA 0-5 0 - 5 WBC/hpf   Bacteria, UA RARE (A) NONE SEEN   Squamous Epithelial / LPF 0-5 0 - 5   Mucus PRESENT    Hyaline Casts, UA PRESENT     Comment: Performed at [facility], [place] [address] [place],  [address]  Urine rapid drug screen (hosp performed)     Status: Abnormal   Collection Time: 04/09/18 10:42 PM  Result Value Ref Range   Opiates NONE DETECTED NONE DETECTED   Cocaine NONE DETECTED NONE DETECTED   Benzodiazepines NONE DETECTED NONE DETECTED   Amphetamines NONE DETECTED NONE DETECTED   Tetrahydrocannabinol NONE DETECTED NONE DETECTED   Barbiturates (A) NONE DETECTED    Result not available. Reagent lot number recalled by manufacturer.    Comment: Performed at [facility], [place] [address] [place],  [address]    No current facility-administered medications for this encounter.    Current Outpatient Medications  Medication Sig Dispense Refill  . apixaban (ELIQUIS) 2.5 MG TABS tablet Take 1 tablet (2.5 mg total) by mouth 2 (two) times daily. (Patient not taking: Reported on 04/09/2018) 60 tablet   . doxycycline (VIBRA-TABS) 100 MG tablet Take 1 tablet (100 mg total) by mouth every 12 (twelve) hours. (Patient not taking: Reported on 04/02/2018) 10 tablet 0  . pantoprazole (PROTONIX) 40 MG tablet Take 1 tablet (40 mg total) by mouth 2 (two) times daily. (Patient not taking: Reported on 04/02/2018) 60 tablet 0  . pravastatin (PRAVACHOL) 20 MG tablet Take 1 tablet (  20 mg total) by mouth at bedtime. (Patient not taking: Reported on 04/02/2018) 30 tablet 1    Musculoskeletal: Strength & Muscle Tone: within normal limits Gait & Station: normal Patient leans: N/A  Psychiatric Specialty Exam: Physical Exam  Nursing note and vitals reviewed. Constitutional: He is oriented to person, place, and time. He appears well-developed  and well-nourished.  HENT:  Head: Normocephalic and atraumatic.  Neck: Normal range of motion.  Respiratory: Effort normal.  Musculoskeletal: Normal range of motion.  Neurological: He is alert and oriented to person, place, and time.  Psychiatric: He has a normal mood and affect. His speech is normal and behavior is normal. Judgment and thought content normal. Cognition and memory are normal.    Review of Systems  Psychiatric/Behavioral: Positive for memory loss (mild). Negative for depression, hallucinations, substance abuse and suicidal ideas. The patient is not nervous/anxious and does not have insomnia.   All other systems reviewed and are negative.   Blood pressure 111/82, pulse (!) 54, temperature (!) 97.4 F (36.3 C), temperature source Oral, resp. rate 16, SpO2 98 %.There is no height or weight on file to calculate BMI.  General Appearance: Casual  Eye Contact:  Good  Speech:  Clear and Coherent  Volume:  Normal  Mood:  Euthymic  Affect:  Congruent  Thought Process:  Coherent, Goal Directed and Linear  Orientation:  Full (Time, Place, and Person)  Thought Content:  Logical  Suicidal Thoughts:  No  Homicidal Thoughts:  No  Memory:  Immediate;   Good Recent;   Good Remote;   Fair  Judgement:  Good  Insight:  Good  Psychomotor Activity:  Normal  Concentration:  Concentration: Good and Attention Span: Good  Recall:  Good  Fund of Knowledge:  Good  Language:  Good  Akathisia:  No  Handed:  Right  AIMS (if indicated):   N/A  Assets:  Agricultural consultant Housing  ADL's:  Intact  Cognition:  WNL  Sleep:   Good     Treatment Plan Summary: Plan Involuntary commitment  Discharge Home Take all medications as prescribed Follow up with your current outpatient provider for medication management  Disposition: No evidence of imminent risk to self or others at present.   Patient does not meet criteria for psychiatric inpatient  admission. Supportive therapy provided about ongoing stressors. Discussed crisis plan, support from social network, calling 911, coming to the Emergency Department, and calling Suicide Hotline.  Ethelene Hal, NP 04/10/2018 11:57 AM   Patient seen face-to-face for psychiatric evaluation, chart reviewed and case discussed with the physician extender and developed treatment plan. Reviewed the information documented and agree with the treatment plan.  Buford Dresser, DO 04/10/18 12:40 PM

## 2018-04-10 NOTE — NC FL2 (Signed)
  [place] LEVEL OF CARE SCREENING TOOL     IDENTIFICATION  Patient Name: [name] Birthdate: Aug 20, 1922 Sex: male Admission Date (Current Location): 04/09/2018  [facility] [place] Number:  Herbalist and Address:  [facility],  [place] [address] [place]      Provider Number: [id number]  Attending Physician Name and Address:  Default, Provider, MD  Relative Name and Phone Number:       Current Level of Care: Hospital Recommended Level of Care: Memory Care Prior Approval Number:    Date Approved/Denied:   PASRR Number:    Discharge Plan: Home    Current Diagnoses: Patient Active Problem List   Diagnosis Date Noted  . Involuntary commitment   . CAP (community acquired pneumonia) 02/17/2018  . CKD (chronic kidney disease), stage III ([place]) 02/17/2018  . Rib pain on right side 09/20/2017  . Chest pain, pleuritic 09/20/2017  . Abdominal pain 09/17/2017  . Hyperlipidemia 09/17/2017  . Hypertension 09/17/2017  . Anemia 09/17/2017  . Atrial fibrillation, chronic ([place]) 11/29/2013  . CVA (cerebral infarction) 11/29/2013  . OTHER VITAMIN B12 DEFICIENCY ANEMIA 01/08/2008  . ANAL FISTULA 01/08/2008    Orientation RESPIRATION BLADDER Height & Weight     Self  Normal Continent Weight:   Height:     BEHAVIORAL SYMPTOMS/MOOD NEUROLOGICAL BOWEL NUTRITION STATUS  Clinical research associate)  AMBULATORY STATUS COMMUNICATION OF NEEDS Skin   Limited Assist Verbally Normal                       Personal Care Assistance Level of Assistance  Bathing, Feeding, Dressing Bathing Assistance: Limited assistance Feeding assistance: Independent Dressing Assistance: Limited assistance     Functional Limitations Info             SPECIAL CARE FACTORS FREQUENCY                       Contractures      Additional Factors Info  Code Status, Allergies Code Status Info: Full code  Allergies Info: Codeine             Current Medications (04/10/2018):  This is the current hospital active medication list No current facility-administered medications for this encounter.    Current Outpatient Medications  Medication Sig Dispense Refill  . apixaban (ELIQUIS) 2.5 MG TABS tablet Take 1 tablet (2.5 mg total) by mouth 2 (two) times daily. (Patient not taking: Reported on 04/09/2018) 60 tablet   . doxycycline (VIBRA-TABS) 100 MG tablet Take 1 tablet (100 mg total) by mouth every 12 (twelve) hours. (Patient not taking: Reported on 04/02/2018) 10 tablet 0  . pantoprazole (PROTONIX) 40 MG tablet Take 1 tablet (40 mg total) by mouth 2 (two) times daily. (Patient not taking: Reported on 04/02/2018) 60 tablet 0  . pravastatin (PRAVACHOL) 20 MG tablet Take 1 tablet (20 mg total) by mouth at bedtime. (Patient not taking: Reported on 04/02/2018) 30 tablet 1     Discharge Medications: Please see discharge summary for a list of discharge medications.  Relevant Imaging Results:  Relevant Lab Results:   Additional [place], LCSW

## 2018-06-16 ENCOUNTER — Emergency Department (HOSPITAL_COMMUNITY): Payer: Medicare Other

## 2018-06-16 ENCOUNTER — Other Ambulatory Visit: Payer: Self-pay

## 2018-06-16 ENCOUNTER — Emergency Department (HOSPITAL_COMMUNITY)
Admission: EM | Admit: 2018-06-16 | Discharge: 2018-06-16 | Disposition: A | Discharge status: Home / Self Care | Payer: Medicare Other | Attending: Emergency Medicine | Admitting: Emergency Medicine

## 2018-06-16 ENCOUNTER — Encounter (HOSPITAL_COMMUNITY): Payer: Self-pay | Admitting: Emergency Medicine

## 2018-06-16 DIAGNOSIS — I129 Hypertensive chronic kidney disease with stage 1 through stage 4 chronic kidney disease, or unspecified chronic kidney disease: Secondary | ICD-10-CM | POA: Diagnosis not present

## 2018-06-16 DIAGNOSIS — N183 Chronic kidney disease, stage 3 (moderate): Secondary | ICD-10-CM | POA: Diagnosis not present

## 2018-06-16 DIAGNOSIS — R000 Tachycardia, unspecified: Secondary | ICD-10-CM | POA: Diagnosis not present

## 2018-06-16 DIAGNOSIS — I499 Cardiac arrhythmia, unspecified: Secondary | ICD-10-CM | POA: Diagnosis not present

## 2018-06-16 DIAGNOSIS — R531 Weakness: Secondary | ICD-10-CM

## 2018-06-16 DIAGNOSIS — G9389 Other specified disorders of brain: Secondary | ICD-10-CM | POA: Diagnosis not present

## 2018-06-16 DIAGNOSIS — I959 Hypotension, unspecified: Secondary | ICD-10-CM | POA: Diagnosis not present

## 2018-06-16 DIAGNOSIS — R918 Other nonspecific abnormal finding of lung field: Secondary | ICD-10-CM | POA: Diagnosis not present

## 2018-06-16 DIAGNOSIS — R5383 Other fatigue: Secondary | ICD-10-CM | POA: Diagnosis present

## 2018-06-16 DIAGNOSIS — M542 Cervicalgia: Secondary | ICD-10-CM | POA: Diagnosis not present

## 2018-06-16 DIAGNOSIS — R410 Disorientation, unspecified: Secondary | ICD-10-CM | POA: Diagnosis not present

## 2018-06-16 DIAGNOSIS — I4891 Unspecified atrial fibrillation: Secondary | ICD-10-CM | POA: Diagnosis not present

## 2018-06-16 LAB — URINALYSIS, ROUTINE W REFLEX MICROSCOPIC
Bilirubin Urine: NEGATIVE
Glucose, UA: NEGATIVE mg/dL
Hgb urine dipstick: NEGATIVE
Ketones, ur: NEGATIVE mg/dL
Nitrite: NEGATIVE
Protein, ur: NEGATIVE mg/dL
Specific Gravity, Urine: 1.019 (ref 1.005–1.030)
pH: 5 (ref 5.0–8.0)

## 2018-06-16 LAB — CBC WITH DIFFERENTIAL/PLATELET
Abs Immature Granulocytes: 0 10*3/uL (ref 0.0–0.1)
Basophils Absolute: 0 10*3/uL (ref 0.0–0.1)
Basophils Relative: 1 %
Eosinophils Absolute: 0.1 10*3/uL (ref 0.0–0.7)
Eosinophils Relative: 1 %
HCT: 39.2 % (ref 39.0–52.0)
Hemoglobin: 12.6 g/dL — ABNORMAL LOW (ref 13.0–17.0)
Immature Granulocytes: 0 %
Lymphocytes Relative: 22 %
Lymphs Abs: 1.4 10*3/uL (ref 0.7–4.0)
MCH: 31.4 pg (ref 26.0–34.0)
MCHC: 32.1 g/dL (ref 30.0–36.0)
MCV: 97.8 fL (ref 78.0–100.0)
Monocytes Absolute: 0.7 10*3/uL (ref 0.1–1.0)
Monocytes Relative: 11 %
Neutro Abs: 4 10*3/uL (ref 1.7–7.7)
Neutrophils Relative %: 65 %
Platelets: 203 10*3/uL (ref 150–400)
RBC: 4.01 MIL/uL — ABNORMAL LOW (ref 4.22–5.81)
RDW: 13.1 % (ref 11.5–15.5)
WBC: 6.1 10*3/uL (ref 4.0–10.5)

## 2018-06-16 LAB — COMPREHENSIVE METABOLIC PANEL
ALT: 10 U/L (ref 0–44)
AST: 21 U/L (ref 15–41)
Albumin: 3.2 g/dL — ABNORMAL LOW (ref 3.5–5.0)
Alkaline Phosphatase: 68 U/L (ref 38–126)
Anion gap: 9 (ref 5–15)
BUN: 25 mg/dL — ABNORMAL HIGH (ref 8–23)
CO2: 24 mmol/L (ref 22–32)
Calcium: 9.2 mg/dL (ref 8.9–10.3)
Chloride: 105 mmol/L (ref 98–111)
Creatinine, Ser: 1.47 mg/dL — ABNORMAL HIGH (ref 0.61–1.24)
GFR calc Af Amer: 45 mL/min — ABNORMAL LOW (ref >60)
GFR calc non Af Amer: 39 mL/min — ABNORMAL LOW (ref >60)
Glucose, Bld: 94 mg/dL (ref 70–99)
Potassium: 3.9 mmol/L (ref 3.5–5.1)
Sodium: 138 mmol/L (ref 135–145)
Total Bilirubin: 1.1 mg/dL (ref 0.3–1.2)
Total Protein: 6.5 g/dL (ref 6.5–8.1)

## 2018-06-16 LAB — CBG MONITORING, ED: Glucose-Capillary: 85 mg/dL (ref 70–99)

## 2018-06-16 LAB — CK: Total CK: 70 U/L (ref 49–397)

## 2018-06-16 MED ORDER — SODIUM CHLORIDE 0.9 % IV BOLUS
500.0000 mL | Freq: Once | INTRAVENOUS | Status: AC
  Administered 2018-06-16: 500 mL via INTRAVENOUS

## 2018-06-16 NOTE — Discharge Instructions (Addendum)
Your workup was reassuring today.  If you develop worsening or new concerning symptoms you can return to the emergency department for re-evaluation.  Social work has seen you and home health should be in contact with you.

## 2018-06-16 NOTE — Progress Notes (Signed)
CSW consulted for possible placement. CSW and RNCM reached out to [place] with [name] to see if pt is eligible for [facility]. [name] to follow up with [facility] about this matter. CSW will follow as needed.    [name], MSW, [facility] Clinical Social Worker [phone]

## 2018-06-16 NOTE — Discharge Planning (Signed)
[facility] consulted regarding home health services v/s SNF placement. Pt does not qualify for SNF placement.  EDCM consulted [facility] for [facility] First program.  Will update staff as information becomes available.

## 2018-06-16 NOTE — ED Notes (Signed)
Pt given urinal for sample.  Pt states he is unable to go at this time.

## 2018-06-16 NOTE — ED Provider Notes (Signed)
[facility] EMERGENCY DEPARTMENT Provider Note   CSN: [id number] Arrival date & time: 06/16/18  1157     History   Chief Complaint Chief Complaint  Patient presents with  . Fatigue    HPI [name] is a 84 y.o. male with a history of atrial fibrillation on Eliquis, CKD stage III, hypertension, hyperlipidemia, CVA x2 who presents emergency department today after being found by his neighbor.  Patient reports that he lives at home by himself in [place].  His closest family members (daughter) lives in [place].  Reportedly talks to his neighbor daily.  His neighbor found the patient lying on his porch today and told him that he did not feel well.  When asking the patient what brings him and he states "CRS -cannot remember shit".  He is alert and oriented x4.  He denies any pain.  He denies any falls.  He denies any headache, visual changes, facial droop, focal weakness, chest pain, shortness of breath, abdominal pain, flank pain, dysuria, urinary symptoms, arthralgias, melena, hematochezia, N/V/D.  EMS reported to nurse that last time patient came in with similar symptoms he was reportedly dehydrated. He walks without a cane or walker. Walks at baseline by himself.   HPI  Past Medical History:  Diagnosis Date  . Atrial fibrillation ([place])   . CAP (community acquired pneumonia) 02/16/2018  . Chronic a-fib ([place])    Archie Endo 09/20/2017  . CKD (chronic kidney disease), stage III ([place])    Archie Endo 02/17/2018  . History of hiatal hernia    Archie Endo 09/20/2017  . Hyperlipidemia   . Hypertension   . Stroke ([place]) 04/20/2008   Archie Endo 01/23/2010  . Stroke [facility]) 11/2013   Large posterior and inferior right MCA stroke/infarct/notes 12/22/2013    Patient Active Problem List   Diagnosis Date Noted  . Involuntary commitment   . CAP (community acquired pneumonia) 02/17/2018  . CKD (chronic kidney disease), stage III ([place]) 02/17/2018  . Rib pain on right side 09/20/2017  .  Chest pain, pleuritic 09/20/2017  . Abdominal pain 09/17/2017  . Hyperlipidemia 09/17/2017  . Hypertension 09/17/2017  . Anemia 09/17/2017  . Atrial fibrillation, chronic ([place]) 11/29/2013  . CVA (cerebral infarction) 11/29/2013  . OTHER VITAMIN B12 DEFICIENCY ANEMIA 01/08/2008  . ANAL FISTULA 01/08/2008    Past Surgical History:  Procedure Laterality Date  . ANAL FISTULOTOMY  10/2006   Archie Endo 03/13/2011  . JOINT REPLACEMENT    . ROTATOR CUFF REPAIR Left 08/1995   Archie Endo 02/27/2011  . TOTAL KNEE ARTHROPLASTY Right 03/2007   Archie Endo 02/27/2011  . TOTAL KNEE ARTHROPLASTY Left 03/2009   Archie Endo 02/27/2011        Home Medications    Prior to Admission medications   Medication Sig Start Date End Date Taking? Authorizing Provider  apixaban (ELIQUIS) 2.5 MG TABS tablet Take 1 tablet (2.5 mg total) by mouth 2 (two) times daily. Patient not taking: Reported on 04/09/2018 02/17/18   Geradine Girt, DO  doxycycline (VIBRA-TABS) 100 MG tablet Take 1 tablet (100 mg total) by mouth every 12 (twelve) hours. Patient not taking: Reported on 04/02/2018 02/17/18   Geradine Girt, DO  pantoprazole (PROTONIX) 40 MG tablet Take 1 tablet (40 mg total) by mouth 2 (two) times daily. Patient not taking: Reported on 04/02/2018 09/20/17   Charlynne Cousins, MD  pravastatin (PRAVACHOL) 20 MG tablet Take 1 tablet (20 mg total) by mouth at bedtime. Patient not taking: Reported on 04/02/2018 12/10/13   [place],  Lavon Paganini, PA-C    Family History Family History  Problem Relation Age of Onset  . Cancer Mother     Social History Social History   Tobacco Use  . Smoking status: Never Smoker  . Smokeless tobacco: Never Used  Substance Use Topics  . Alcohol use: No  . Drug use: No     Allergies   Codeine   Review of Systems Review of Systems  All other systems reviewed and are negative.    Physical Exam Updated Vital Signs BP 117/74 (BP Location: Right Arm)   Pulse (!) 104 Comment: afib  Temp  97.9 F (36.6 C) (Oral)   Resp 14   SpO2 98%   Physical Exam  Constitutional: He appears well-developed.  Frail elderly male in no acute distress  HENT:  Head: Normocephalic and atraumatic.  Right Ear: External ear normal.  Left Ear: External ear normal.  Nose: Nose normal.  Mouth/Throat: Uvula is midline, oropharynx is clear and moist and mucous membranes are normal. No tonsillar exudate.  Eyes: Pupils are equal, round, and reactive to light. Right eye exhibits no discharge. Left eye exhibits no discharge. No scleral icterus.  Neck: Trachea normal. Neck supple. No spinous process tenderness present. No neck rigidity. Normal range of motion present.  No C-spine tenderness palpation or step-offs.  Cardiovascular: Intact distal pulses. An irregularly irregular rhythm present. Tachycardia present.  Pulses:      Radial pulses are 2+ on the right side, and 2+ on the left side.       Dorsalis pedis pulses are 2+ on the right side, and 2+ on the left side.       Posterior tibial pulses are 2+ on the right side, and 2+ on the left side.  No lower extremity swelling or edema. Calves symmetric in size bilaterally.  Pulmonary/Chest: Effort normal and breath sounds normal. He exhibits no tenderness.  Abdominal: Soft. Bowel sounds are normal. He exhibits no distension. There is no tenderness. There is no rigidity, no rebound and no guarding.  Musculoskeletal: He exhibits no edema.  AC deformity of the left shoulder appears chronic.  No tenderness palpation.  There is no skin tenting or skin breaks.  Passive range of motion of bilateral shoulders, elbows, wrists, fingers, hips, knees, ankles without pain or limited range of motion.  Negative logroll test bilaterally.  No sacral crepitus.  No leg shortening or external rotation. Compartments soft to upper and lower extremities. He is NVI to upper and lower extremities.   Lymphadenopathy:    He has no cervical adenopathy.  Neurological: He is alert.    GCS 15.  He is alert and oriented x3.  Speech is clear.  No facial droop.  Follows commands. PEERL intact b/l. EOM intact b/l without nystagmus.  Cranial Nerves:  II:  Pupils equal, round, reactive to light III,IV, VI: ptosis not present, extra-ocular motions intact bilaterally  V,VII: smile symmetric, facial light touch sensation equal VIII: Patient with poor hearing at baseline IX,X: midline uvula rise  XI: bilateral shoulder shrug equal and strong XII: midline tongue extension Moves all extremities 4 without ataxia. Coordination intact. Able and appropriate strength for age to upper and lower extremities bilaterally. Sensation to light touch intact bilaterally for upper and lower. Patellar deep tendon reflex 2+ and equal bilaterally. Normal finger to nose and rapid alternating movements. Normal heel to shin balance. No pronator drift.  Skin: Skin is warm and dry. No rash noted. He is not diaphoretic.  Psychiatric: He  has a normal mood and affect.  Nursing note and vitals reviewed.    ED Treatments / Results  Labs (all labs ordered are listed, but only abnormal results are displayed) Labs Reviewed  CBC WITH DIFFERENTIAL/PLATELET - Abnormal; Notable for the following components:      Result Value   RBC 4.01 (*)    Hemoglobin 12.6 (*)    All other components within normal limits  COMPREHENSIVE METABOLIC PANEL - Abnormal; Notable for the following components:   BUN 25 (*)    Creatinine, Ser 1.47 (*)    Albumin 3.2 (*)    GFR calc non Af Amer 39 (*)    GFR calc Af Amer 45 (*)    All other components within normal limits  URINALYSIS, ROUTINE W REFLEX MICROSCOPIC - Abnormal; Notable for the following components:   Leukocytes, UA TRACE (*)    Bacteria, UA RARE (*)    All other components within normal limits  URINE CULTURE  CK  CBG MONITORING, ED    EKG EKG Interpretation  Date/Time:  Tuesday June 16 2018 12:04:47 EDT Ventricular Rate:  119 PR Interval:    QRS  Duration: 90 QT Interval:  357 QTC Calculation: 503 R Axis:   99 Text Interpretation:  Atrial fibrillation Anterior infarct, old Prolonged QT interval Confirmed by Lennice Sites [phone]) on 06/16/2018 4:19:13 PM   Radiology Dg Chest 2 View  Result Date: 06/16/2018 CLINICAL DATA:  Fatigue.  Confusion. EXAM: CHEST - 2 VIEW COMPARISON:  04/02/2018 and 02/16/2018. FINDINGS: The heart size and mediastinal contours are stable. There is aortic atherosclerosis and tortuosity. There are low lung volumes with mildly increased patchy opacity in the left lower lobe, best seen on the lateral view. There is no consolidation, edema, pneumothorax or significant pleural effusion. No acute osseous findings are seen. Telemetry leads overlie the chest. IMPRESSION: Mildly increased left lower lobe opacity is associated with lower lung volumes and may reflect atelectasis. Cannot exclude early aspiration. Otherwise stable chest. Electronically Signed   By: Richardean Sale M.D.   On: 06/16/2018 13:36   Ct Head Wo Contrast  Result Date: 06/16/2018 CLINICAL DATA:  Neighbor found pt laying on neighbors porch and pt told him he did not feel well. EXAM: CT HEAD WITHOUT CONTRAST CT CERVICAL SPINE WITHOUT CONTRAST TECHNIQUE: Multidetector CT imaging of the head and cervical spine was performed following the standard protocol without intravenous contrast. Multiplanar CT image reconstructions of the cervical spine were also generated. COMPARISON:  04/02/2018 FINDINGS: CT HEAD FINDINGS Brain: No evidence of acute infarction, hemorrhage, extra-axial collection, ventriculomegaly, or mass effect. Large chronic right MCA territory infarct with encephalomalacia. Generalized cerebral atrophy. Periventricular white matter low attenuation likely secondary to microangiopathy. Vascular: Cerebrovascular atherosclerotic calcifications are noted. Skull: Negative for fracture or focal lesion. Sinuses/Orbits: Visualized portions of the orbits are  unremarkable. Visualized portions of the paranasal sinuses and mastoid air cells are unremarkable. Other: None. CT CERVICAL SPINE FINDINGS Alignment: 2 mm anterolisthesis C7 on T1. Skull base and vertebrae: No acute fracture. No primary bone lesion or focal pathologic process. Pannus formation along the posterior aspect of the dens. Soft tissues and spinal canal: No prevertebral fluid or swelling. No visible canal hematoma. Disc levels: Degenerative disc disease with disc height loss at C3-4, C4-5, C5-6, C6-7, C7-T1, T1-2, T2-3, T3-4. At C2-3 there is a broad-based disc osteophyte complex with moderate bilateral facet arthropathy. At C3-4 there is a broad-based disc osteophyte complex, bilateral uncovertebral degenerative changes, severe bilateral facet arthropathy hand right  foraminal stenosis. At C4-5 there is mild bilateral facet arthropathy. At C5-6 there is a broad-based disc osteophyte complex, bilateral uncovertebral degenerative changes, moderate bilateral facet arthropathy and left foraminal stenosis. At C6-7 there is a broad-based disc osteophyte complex, moderate bilateral facet arthropathy and left foraminal stenosis. Broad-based disc osteophyte complex at T1-2. Upper chest: Lung apices are clear. Other: No fluid collection or hematoma. Bilateral carotid artery atherosclerosis. IMPRESSION: 1. No acute intracranial pathology. 2.  No acute osseous injury of the cervical spine. 3. Cervical spine spondylosis as described above. Electronically Signed   By: Kathreen Devoid   On: 06/16/2018 13:50   Ct Cervical Spine Wo Contrast  Result Date: 06/16/2018 CLINICAL DATA:  Neighbor found pt laying on neighbors porch and pt told him he did not feel well. EXAM: CT HEAD WITHOUT CONTRAST CT CERVICAL SPINE WITHOUT CONTRAST TECHNIQUE: Multidetector CT imaging of the head and cervical spine was performed following the standard protocol without intravenous contrast. Multiplanar CT image reconstructions of the cervical  spine were also generated. COMPARISON:  04/02/2018 FINDINGS: CT HEAD FINDINGS Brain: No evidence of acute infarction, hemorrhage, extra-axial collection, ventriculomegaly, or mass effect. Large chronic right MCA territory infarct with encephalomalacia. Generalized cerebral atrophy. Periventricular white matter low attenuation likely secondary to microangiopathy. Vascular: Cerebrovascular atherosclerotic calcifications are noted. Skull: Negative for fracture or focal lesion. Sinuses/Orbits: Visualized portions of the orbits are unremarkable. Visualized portions of the paranasal sinuses and mastoid air cells are unremarkable. Other: None. CT CERVICAL SPINE FINDINGS Alignment: 2 mm anterolisthesis C7 on T1. Skull base and vertebrae: No acute fracture. No primary bone lesion or focal pathologic process. Pannus formation along the posterior aspect of the dens. Soft tissues and spinal canal: No prevertebral fluid or swelling. No visible canal hematoma. Disc levels: Degenerative disc disease with disc height loss at C3-4, C4-5, C5-6, C6-7, C7-T1, T1-2, T2-3, T3-4. At C2-3 there is a broad-based disc osteophyte complex with moderate bilateral facet arthropathy. At C3-4 there is a broad-based disc osteophyte complex, bilateral uncovertebral degenerative changes, severe bilateral facet arthropathy hand right foraminal stenosis. At C4-5 there is mild bilateral facet arthropathy. At C5-6 there is a broad-based disc osteophyte complex, bilateral uncovertebral degenerative changes, moderate bilateral facet arthropathy and left foraminal stenosis. At C6-7 there is a broad-based disc osteophyte complex, moderate bilateral facet arthropathy and left foraminal stenosis. Broad-based disc osteophyte complex at T1-2. Upper chest: Lung apices are clear. Other: No fluid collection or hematoma. Bilateral carotid artery atherosclerosis. IMPRESSION: 1. No acute intracranial pathology. 2.  No acute osseous injury of the cervical spine. 3.  Cervical spine spondylosis as described above. Electronically Signed   By: Kathreen Devoid   On: 06/16/2018 13:50    Procedures Procedures (including critical care time)  Medications Ordered in ED Medications  sodium chloride 0.9 % bolus 500 mL (0 mLs Intravenous Stopped 06/16/18 1259)     Initial Impression / Assessment and Plan / ED Course  I have reviewed the triage vital signs and the nursing notes.  Pertinent labs & imaging results that were available during my care of the patient were reviewed by me and considered in my medical decision making (see chart for details).     84 y.o. male presenting with general fatigue.  Question if patient had a fall.  Normal neurologic exam as above. He lives at home by himself.  His family is in [place].  His neighbor was concerned that he appeared mildly confused.  Patient is overall well-appearing on exam.  Is able to  answer all questions.  He is alert and oriented x4.  No focal neurologic deficits.  He denies any pain.  He denies any infectious symptoms.  Work-up as above overall unremarkable.  EKG shows atrial fibrillation.  Heart rate improved while in the department.   He is on Eliquis for this.  UA without evidence UTI.  No leukocytosis.  Baseline Anemia.  He denies melena or hematochezia.  No hypoglycemia.  No evidence of DKA.  No significant elect light derangements.  Baseline creatinine.  No evidence of rhabdomyolysis.  Chest x-ray without evidence of pneumonia, pneumothorax, pleural effusion, pulmonary edema.  CT head and neck unremarkable.  Social work saw the patient was able to get in touch with his legal guardian, his daughter.  They are able to arrange for safe discharge home.  They are attempting to get placement.  Home health will see the patient on outpatient basis.  They have attempted several times for nursing home and home health to assist him in the past.  Patient denies any current complaints and lab work reviewed again and reassuring.   Patient discharged in good condition.  Patient case seen and discussed with Dr. Ronnald Nian who is in agreement with plan.   Final Clinical Impressions(s) / ED Diagnoses   Final diagnoses:  Generalized weakness    ED Discharge Orders    None       Jillyn Ledger, Hershal Coria 06/16/18 [place], [place], DO 06/16/18 2025

## 2018-06-16 NOTE — ED Notes (Signed)
Patient transported to X-ray 

## 2018-06-16 NOTE — Progress Notes (Addendum)
CSW called pt's legal guardian, Gae Bon, pt's daughter at [phone]. CSW had to leave voicemail for pt's daughter.   Per previous, CSW and RNCM notes, pt is not eligible for SNF placement to be paid for by insurance. Medicare A & B requires 3 night qualifying stay to cover SNF.   Update: CSW spoke pt's daughter, legal guardian, Jan Ford. Jan explained that pt has had home health in the past, but then becomes verbally abusive and refuses home health services. Pt was established with home first through [place], but refused services when they got out to the home. RNCM working to re-establish pt with home first program.   Per pt's daughter, pt is connected with the [facility]. CSW will call pt's social worker, Glean Hess. Pt receives private pay meals on wheels through [employer]. Pt's daughter has concerns about pt going up and down steps at home, split level house. Pt has a small dog. CSW explained that SNF would not be paid for by insurance due to not having a 3 night qualifying stay. Pt's daughter understanding. Pt's daughter working to get pt placed, but per daughter, pt is likely to refuse placement and leave.   [facility] Care Manager at [phone]. Working with family members to find pt placement. Care Manager does not have placement for pt as of now. Care Manager aware pt will be transitioned home with home health.   CSW called to update pt's legal guardian, Jan Ford. Agreeable to pt going home.   Wendelyn Breslow, [facility]  [phone]

## 2018-06-16 NOTE — ED Notes (Addendum)
Discharge instructions discussed with Pt. Pt verbalized understanding. Pt stable and leaving via WC.    

## 2018-06-16 NOTE — ED Notes (Signed)
Pt yelling "Get me the hell out of here, you aren't doing anything for me".  Explained to patient that social work is coming to talk to him. PA Maczis notified.

## 2018-06-16 NOTE — ED Triage Notes (Addendum)
Neighbor found pt laying on neighbors porch and pt told him he did not feel well.  Bp 112 palpated, Cbg 95, SPo2 98%, Pulse afib 98-110. Pt A&O x4.

## 2018-06-16 NOTE — ED Provider Notes (Signed)
Medical screening examination/treatment/procedure(s) were conducted as a shared visit with non-physician practitioner(s) and myself.  I personally evaluated the patient during the encounter. Briefly, the patient is a 84 y.o. male who presents the ED with general fatigue.  Patient according to EMS was found by his neighbor appearing mildly confused.  Patient lives by himself.  He has family in [place].  Patient is overall well-appearing.  Patient is pleasant and is able to answer questions.  Exam is overall unremarkable.  Patient had extensive work-up that showed no acute findings.  EKG shows atrial fibrillation but overall rate controlled.  Heart rate mostly in the 90s while in the ED.  Patient had no significant anemia, electrolyte abnormality, kidney injury.  Chest x-ray showed no pneumonia, pneumothorax, pleural effusion.  Urine studies unremarkable.  Patient with no infectious symptoms or infectious findings on work-up.  Upon further discussion with the patient he states that he forgets to eat and drink.  He is unable to tell me the last time he had something to eat.  He does not appear safe to live by himself.  He states that he does not remember the last time he took any medications.  It appears in the past that family members in [place] have tried to fill out an IVC but patient does not have any psychiatric illnesses upon my evaluation.  No suicidal ideation, homicidal ideation.  Therefore, social work was engaged to help with safe discharge planning for the patient.   Social work came by and was able to get in touch with his legal guardian who is his daughter.  They were able to arrange for safe discharge.  Patient has been trying to get placement.  Patient with heart rate improved to the 80s and 90s throughout her care.  No need for any admission and was discharged in good condition.  This chart was dictated using voice recognition software.  Despite best efforts to proofread,  errors can occur which  can change the documentation meaning.    EKG Interpretation  Date/Time:  Tuesday June 16 2018 12:04:47 EDT Ventricular Rate:  119 PR Interval:    QRS Duration: 90 QT Interval:  357 QTC Calculation: 503 R Axis:   99 Text Interpretation:  Atrial fibrillation Anterior infarct, old Prolonged QT interval Confirmed by Lennice Sites [phone]) on 06/16/2018 4:19:13 PM           Lennice Sites, DO 06/16/18 1857

## 2018-06-16 NOTE — ED Notes (Signed)
Pt has requested that I not come back in room because "you think something's funny and not a damn things funny."  RN at bedside when pt said this.  Urine being sent to lab at this time.

## 2018-06-17 LAB — URINE CULTURE

## 2018-06-18 IMAGING — CT CT CTA ABD/PEL W/CM AND/OR W/O CM
2 of 9 series · 10 of 46 positions shown, 16 images · IV contrast (ISOVUE 370)
Comparison: CT abdomen and pelvis 09/17/2017

CLINICAL DATA: Right upper quadrant pain.  Atrial fibrillation.

EXAM:
CTA ABDOMEN AND PELVIS wITHOUT AND WITH CONTRAST
TECHNIQUE: Multidetector CT imaging of the abdomen and pelvis was performed
using the standard protocol during bolus administration of
intravenous contrast. Multiplanar reconstructed images and MIPs were
obtained and reviewed to evaluate the vascular anatomy.
CONTRAST:  80mL VGIJ6P-CQ8 IOPAMIDOL (VGIJ6P-CQ8) INJECTION 76%

[Series 5: venous 5.0 b30f · axial · portal-venous · 0.75mm/px · z∈[-443,-143]mm · 8 of 78 slices shown, 13 images]
[im 9/78  soft-tissue]
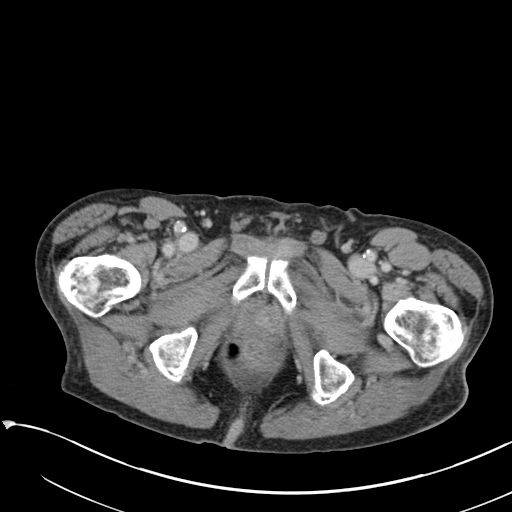
[im 9/78  bone]
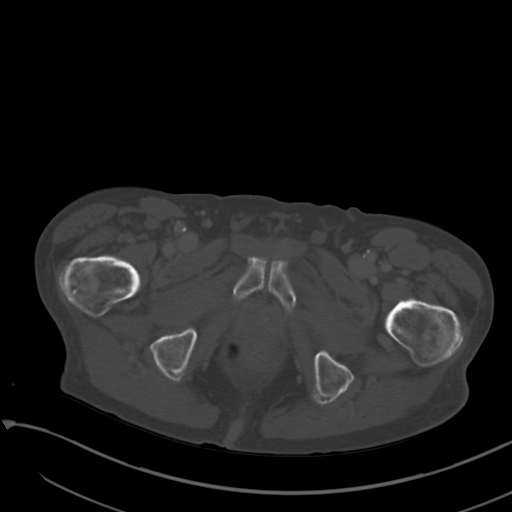
[im 18/78  soft-tissue]
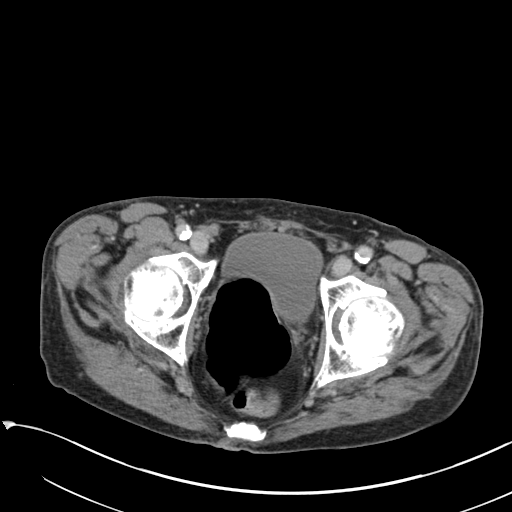
[im 26/78  soft-tissue]
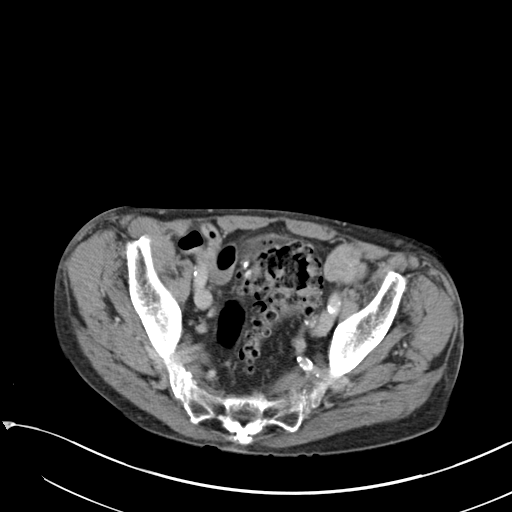
[im 35/78  soft-tissue]
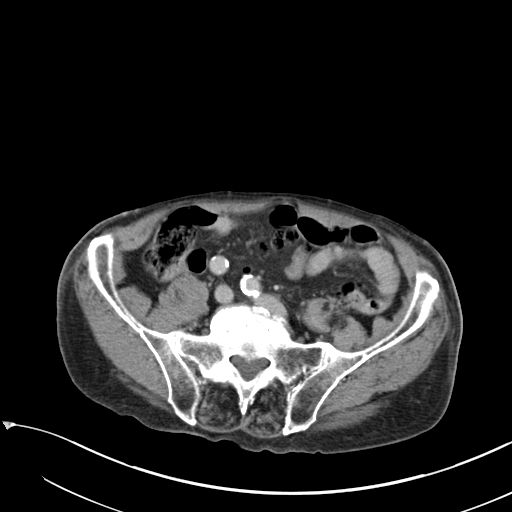
[im 43/78  soft-tissue]
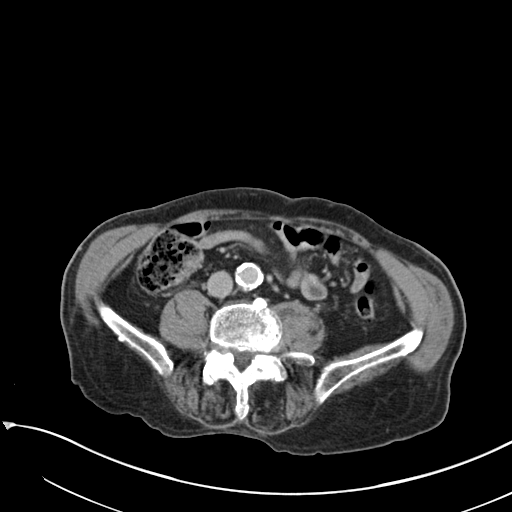
[im 43/78  lung]
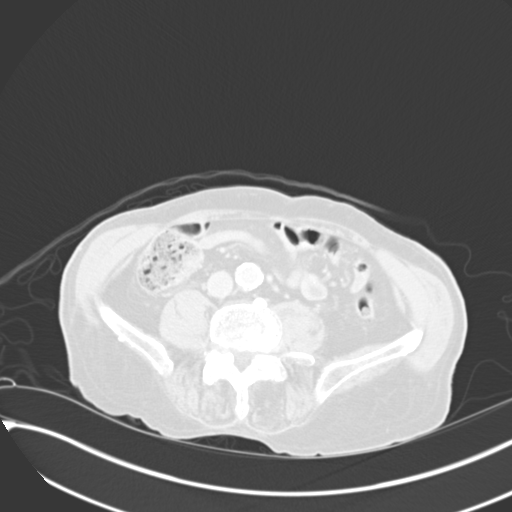
[im 52/78  soft-tissue]
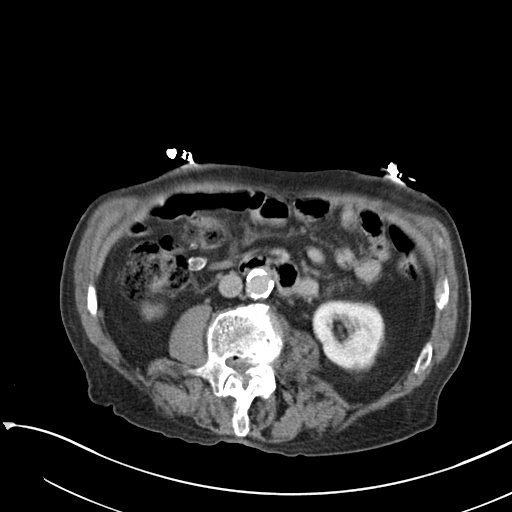
[im 52/78  lung]
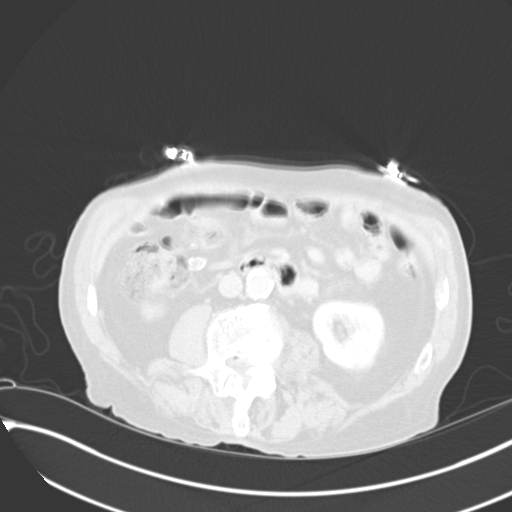
[im 60/78  soft-tissue]
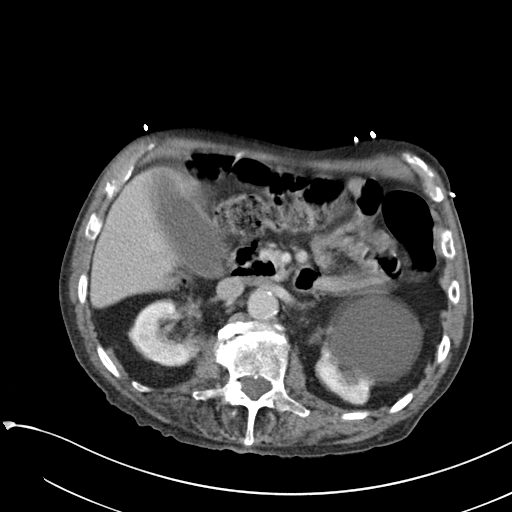
[im 60/78  lung]
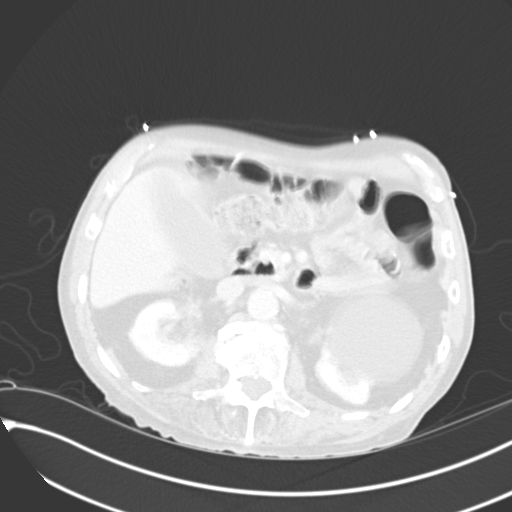
[im 69/78  soft-tissue]
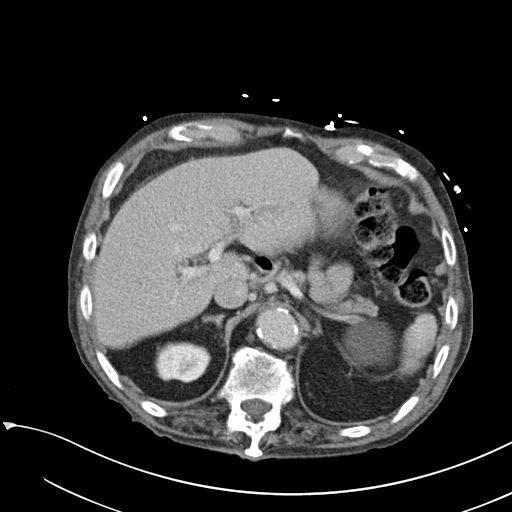
[im 69/78  lung]
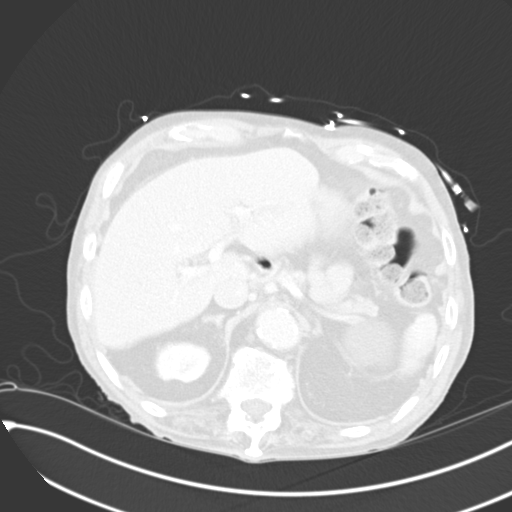

[Series 7: coronal · coronal · 0.71mm/px · 2 of 137 slices shown, 3 images]
[im 46/137  soft-tissue]
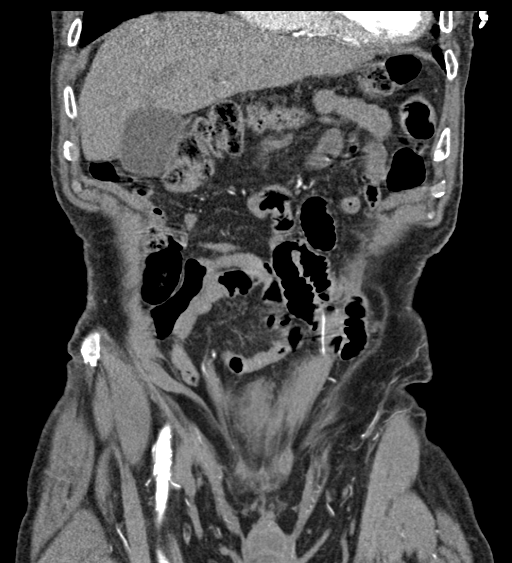
[im 46/137  bone]
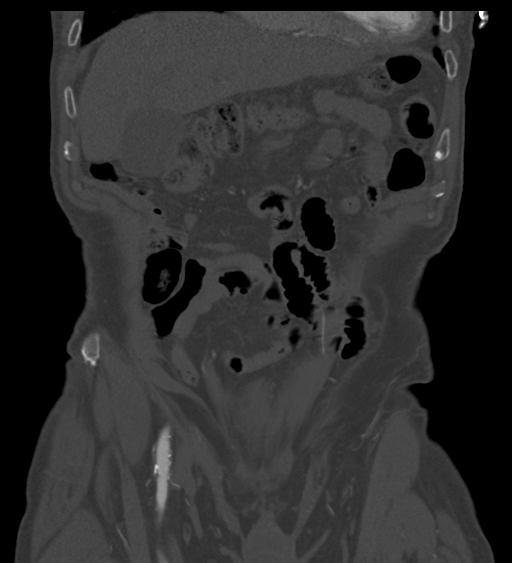
[im 91/137  soft-tissue]
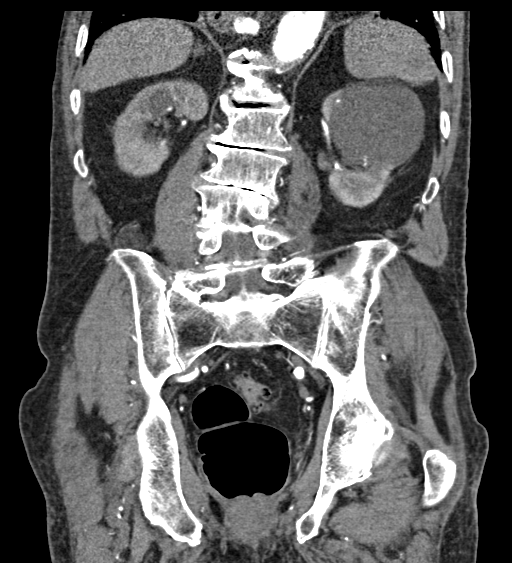

[10 of 46 positions shown; findings below may reference images not displayed]

FINDINGS: VASCULAR

Aorta: Diffuse calcific atherosclerotic change. Focal area of
ulcerated plaque formation just distal to the origin of the right
renal artery. No aneurysm or dissection. No intraluminal thrombus or
filling defects. No significant stenosis.

Celiac: Calcification at the origin of the celiac axis with possible
focal stenosis but the celiac axis and branch vessels are patent.

SMA: Calcification at the origin of the superior mesenteric artery
with a few scattered calcifications distally. No evidence of
significant stenosis or thrombus.

Renals: Single renal arteries bilaterally. Calcification in the
origins of both renal arteries without significant stenosis or
occlusion. Nephrograms are symmetrical.

IMA: Inferior mesenteric artery is patent. No significant stenosis,
dissection, aneurysm or thrombus.

Inflow: Diffuse atherosclerotic calcification. No significant
stenosis, dissection, aneurysm or thrombosis.

Proximal Outflow: Diffuse atherosclerotic calcification. No
significant stenosis, dissection, aneurysm or thrombosis.

Veins: No obvious venous abnormality.

Review of the MIP images confirms the above findings.

NON-VASCULAR

Lower chest: Large esophageal hiatal hernia with most of the stomach
above the diaphragms. Atelectasis in the lung bases. Minimal right
pleural effusion. Cardiac enlargement. Coronary artery
calcifications. No pericardial effusion.

Hepatobiliary: Subcentimeter cysts in the liver. No change since
previous study. Gallbladder and bile ducts are unremarkable.

Pancreas: Unremarkable. No pancreatic ductal dilatation or
surrounding inflammatory changes.

Spleen: Normal in size without focal abnormality.

Adrenals/Urinary Tract: No adrenal gland nodules. Bilateral renal
cysts, largest on the left measuring 7.3 cm diameter. No
hydronephrosis or hydroureter. Bladder wall is not thickened. No
bladder filling defects.

Stomach/Bowel: Intrathoracic stomach is incompletely visualized.
Visualized stomach and small bowel are decompressed. Stool
throughout the colon. Colonic diverticulosis without evidence of
diverticulitis. Appendix is normal.

Lymphatic: No significant lymphadenopathy. Scattered calcified lymph
nodes in the abdomen and pelvis probably postinflammatory.

Reproductive: Prostate gland is enlarged, measuring 4 cm diameter.

Other: No abdominal wall hernia or abnormality. No abdominopelvic
ascites.

Musculoskeletal: Degenerative changes in the spine and hips. No
destructive bone lesions.
IMPRESSION: VASCULAR

1. Diffuse calcific atherosclerotic changes throughout. No
significant stenosis or thrombus identified.
2. Focal area of ulcerated plaque formation in the aorta just distal
to the right renal artery.

NON-VASCULAR

1. Large esophageal hiatal hernia containing most of the stomach.
2. Minimal right pleural effusion.
3. Bilateral renal cysts.
4. Scattered hepatic cysts.
5. Enlarged prostate gland.

## 2018-08-04 ENCOUNTER — Encounter (HOSPITAL_COMMUNITY): Payer: Self-pay

## 2018-08-04 ENCOUNTER — Inpatient Hospital Stay (HOSPITAL_COMMUNITY)
Admission: EM | Admit: 2018-08-04 | Discharge: 2018-08-10 | DRG: 682 | Disposition: A | Discharge status: Skilled Nursing Facility | Payer: Medicare Other | Attending: Internal Medicine | Admitting: Internal Medicine

## 2018-08-04 ENCOUNTER — Other Ambulatory Visit: Payer: Self-pay

## 2018-08-04 DIAGNOSIS — Z96653 Presence of artificial knee joint, bilateral: Secondary | ICD-10-CM | POA: Diagnosis present

## 2018-08-04 DIAGNOSIS — R402142 Coma scale, eyes open, spontaneous, at arrival to emergency department: Secondary | ICD-10-CM | POA: Diagnosis present

## 2018-08-04 DIAGNOSIS — L8990 Pressure ulcer of unspecified site, unspecified stage: Secondary | ICD-10-CM

## 2018-08-04 DIAGNOSIS — I482 Chronic atrial fibrillation, unspecified: Secondary | ICD-10-CM | POA: Diagnosis present

## 2018-08-04 DIAGNOSIS — Z885 Allergy status to narcotic agent status: Secondary | ICD-10-CM

## 2018-08-04 DIAGNOSIS — I129 Hypertensive chronic kidney disease with stage 1 through stage 4 chronic kidney disease, or unspecified chronic kidney disease: Secondary | ICD-10-CM | POA: Diagnosis present

## 2018-08-04 DIAGNOSIS — R627 Adult failure to thrive: Secondary | ICD-10-CM | POA: Diagnosis not present

## 2018-08-04 DIAGNOSIS — Z66 Do not resuscitate: Secondary | ICD-10-CM | POA: Diagnosis present

## 2018-08-04 DIAGNOSIS — E861 Hypovolemia: Secondary | ICD-10-CM | POA: Diagnosis present

## 2018-08-04 DIAGNOSIS — J690 Pneumonitis due to inhalation of food and vomit: Secondary | ICD-10-CM

## 2018-08-04 DIAGNOSIS — K449 Diaphragmatic hernia without obstruction or gangrene: Secondary | ICD-10-CM | POA: Diagnosis present

## 2018-08-04 DIAGNOSIS — Z7901 Long term (current) use of anticoagulants: Secondary | ICD-10-CM | POA: Diagnosis not present

## 2018-08-04 DIAGNOSIS — Z809 Family history of malignant neoplasm, unspecified: Secondary | ICD-10-CM

## 2018-08-04 DIAGNOSIS — E876 Hypokalemia: Secondary | ICD-10-CM | POA: Diagnosis present

## 2018-08-04 DIAGNOSIS — L89152 Pressure ulcer of sacral region, stage 2: Secondary | ICD-10-CM | POA: Diagnosis present

## 2018-08-04 DIAGNOSIS — R402252 Coma scale, best verbal response, oriented, at arrival to emergency department: Secondary | ICD-10-CM | POA: Diagnosis present

## 2018-08-04 DIAGNOSIS — Z681 Body mass index (BMI) 19 or less, adult: Secondary | ICD-10-CM

## 2018-08-04 DIAGNOSIS — E43 Unspecified severe protein-calorie malnutrition: Secondary | ICD-10-CM | POA: Diagnosis present

## 2018-08-04 DIAGNOSIS — Z8673 Personal history of transient ischemic attack (TIA), and cerebral infarction without residual deficits: Secondary | ICD-10-CM

## 2018-08-04 DIAGNOSIS — N183 Chronic kidney disease, stage 3 unspecified: Secondary | ICD-10-CM | POA: Diagnosis present

## 2018-08-04 DIAGNOSIS — R402362 Coma scale, best motor response, obeys commands, at arrival to emergency department: Secondary | ICD-10-CM | POA: Diagnosis present

## 2018-08-04 DIAGNOSIS — E860 Dehydration: Secondary | ICD-10-CM | POA: Diagnosis present

## 2018-08-04 DIAGNOSIS — N179 Acute kidney failure, unspecified: Secondary | ICD-10-CM | POA: Diagnosis not present

## 2018-08-04 DIAGNOSIS — E785 Hyperlipidemia, unspecified: Secondary | ICD-10-CM | POA: Diagnosis present

## 2018-08-04 DIAGNOSIS — I10 Essential (primary) hypertension: Secondary | ICD-10-CM | POA: Diagnosis present

## 2018-08-04 DIAGNOSIS — D519 Vitamin B12 deficiency anemia, unspecified: Secondary | ICD-10-CM | POA: Diagnosis present

## 2018-08-04 DIAGNOSIS — R5381 Other malaise: Secondary | ICD-10-CM | POA: Diagnosis not present

## 2018-08-04 LAB — CBC WITH DIFFERENTIAL/PLATELET
Abs Immature Granulocytes: 0.01 10*3/uL (ref 0.00–0.07)
Basophils Absolute: 0 10*3/uL (ref 0.0–0.1)
Basophils Relative: 0 %
Eosinophils Absolute: 0.2 10*3/uL (ref 0.0–0.5)
Eosinophils Relative: 2 %
HCT: 42.3 % (ref 39.0–52.0)
Hemoglobin: 13.8 g/dL (ref 13.0–17.0)
Immature Granulocytes: 0 %
Lymphocytes Relative: 15 %
Lymphs Abs: 1 10*3/uL (ref 0.7–4.0)
MCH: 31.2 pg (ref 26.0–34.0)
MCHC: 32.6 g/dL (ref 30.0–36.0)
MCV: 95.7 fL (ref 80.0–100.0)
Monocytes Absolute: 0.4 10*3/uL (ref 0.1–1.0)
Monocytes Relative: 5 %
Neutro Abs: 5.3 10*3/uL (ref 1.7–7.7)
Neutrophils Relative %: 78 %
Platelets: 192 10*3/uL (ref 150–400)
RBC: 4.42 MIL/uL (ref 4.22–5.81)
RDW: 13 % (ref 11.5–15.5)
WBC: 6.9 10*3/uL (ref 4.0–10.5)
nRBC: 0 % (ref 0.0–0.2)

## 2018-08-04 LAB — URINALYSIS, ROUTINE W REFLEX MICROSCOPIC
Bacteria, UA: NONE SEEN
Bilirubin Urine: NEGATIVE
Glucose, UA: NEGATIVE mg/dL
Hgb urine dipstick: NEGATIVE
Ketones, ur: 5 mg/dL — AB
Nitrite: NEGATIVE
Protein, ur: NEGATIVE mg/dL
Specific Gravity, Urine: 1.02 (ref 1.005–1.030)
pH: 5 (ref 5.0–8.0)

## 2018-08-04 LAB — BASIC METABOLIC PANEL
Anion gap: 11 (ref 5–15)
BUN: 43 mg/dL — ABNORMAL HIGH (ref 8–23)
CO2: 26 mmol/L (ref 22–32)
Calcium: 9.9 mg/dL (ref 8.9–10.3)
Chloride: 104 mmol/L (ref 98–111)
Creatinine, Ser: 1.74 mg/dL — ABNORMAL HIGH (ref 0.61–1.24)
GFR calc Af Amer: 37 mL/min — ABNORMAL LOW (ref >60)
GFR calc non Af Amer: 32 mL/min — ABNORMAL LOW (ref >60)
Glucose, Bld: 93 mg/dL (ref 70–99)
Potassium: 4.8 mmol/L (ref 3.5–5.1)
Sodium: 141 mmol/L (ref 135–145)

## 2018-08-04 LAB — CBG MONITORING, ED: Glucose-Capillary: 84 mg/dL (ref 70–99)

## 2018-08-04 MED ORDER — METOPROLOL TARTRATE 5 MG/5ML IV SOLN
2.5000 mg | INTRAVENOUS | Status: DC | PRN
  Administered 2018-08-06 – 2018-08-07 (×2): 2.5 mg via INTRAVENOUS
  Filled 2018-08-04 (×2): qty 5

## 2018-08-04 MED ORDER — HYDROCODONE-ACETAMINOPHEN 5-325 MG PO TABS: 1.0000 | ORAL_TABLET | ORAL | Status: DC | PRN

## 2018-08-04 MED ORDER — ONDANSETRON HCL 4 MG/2ML IJ SOLN
4.0000 mg | Freq: Four times a day (QID) | INTRAMUSCULAR | Status: DC | PRN
  Administered 2018-08-05: 4 mg via INTRAVENOUS
  Filled 2018-08-04: qty 2

## 2018-08-04 MED ORDER — ENSURE ENLIVE PO LIQD
237.0000 mL | Freq: Two times a day (BID) | ORAL | Status: DC
  Administered 2018-08-05: 237 mL via ORAL

## 2018-08-04 MED ORDER — SODIUM CHLORIDE 0.9% FLUSH
3.0000 mL | Freq: Two times a day (BID) | INTRAVENOUS | Status: DC
  Administered 2018-08-05 – 2018-08-10 (×4): 3 mL via INTRAVENOUS

## 2018-08-04 MED ORDER — SENNOSIDES-DOCUSATE SODIUM 8.6-50 MG PO TABS: 1.0000 | ORAL_TABLET | Freq: Every evening | ORAL | Status: DC | PRN

## 2018-08-04 MED ORDER — ACETAMINOPHEN 325 MG PO TABS: 650.0000 mg | ORAL_TABLET | Freq: Four times a day (QID) | ORAL | Status: DC | PRN

## 2018-08-04 MED ORDER — ACETAMINOPHEN 650 MG RE SUPP: 650.0000 mg | Freq: Four times a day (QID) | RECTAL | Status: DC | PRN

## 2018-08-04 MED ORDER — SODIUM CHLORIDE 0.9 % IV SOLN
INTRAVENOUS | Status: AC
  Administered 2018-08-04 – 2018-08-05 (×2): via INTRAVENOUS

## 2018-08-04 MED ORDER — SODIUM CHLORIDE 0.9 % IV BOLUS
1000.0000 mL | Freq: Once | INTRAVENOUS | Status: AC
  Administered 2018-08-04: 1000 mL via INTRAVENOUS

## 2018-08-04 MED ORDER — HEPARIN SODIUM (PORCINE) 5000 UNIT/ML IJ SOLN
5000.0000 [IU] | Freq: Three times a day (TID) | INTRAMUSCULAR | Status: DC
  Filled 2018-08-04 (×3): qty 1

## 2018-08-04 MED ORDER — ONDANSETRON HCL 4 MG PO TABS: 4.0000 mg | ORAL_TABLET | Freq: Four times a day (QID) | ORAL | Status: DC | PRN

## 2018-08-04 MED ORDER — SODIUM CHLORIDE 0.9 % IV SOLN
INTRAVENOUS | Status: DC
  Administered 2018-08-04: 18:00:00 via INTRAVENOUS

## 2018-08-04 MED ORDER — BISACODYL 5 MG PO TBEC: 5.0000 mg | DELAYED_RELEASE_TABLET | Freq: Every day | ORAL | Status: DC | PRN

## 2018-08-04 NOTE — Progress Notes (Signed)
CSW notified by Charge Nurse that patient was brought in due to current living situation. CSW also notified that patient's legal guardian/daughter [name] [phone], reached out regarding patient. CSW spoke with [name] who reported patient is involved with [facility] Social Worker, [name] [phone], who is assisting patient. [name] expressed concerns with patient living alone and his health declining. [name] reports that she and her sister got patient set up with private pay Meals on Wheels but is unsure if patient is actually receiving meals. Per daughter, patient has lost a significant amount of weight.   Daughter wanting patient placed into a facility. CSW aware patient has Medicare Part A and B and does not have 3-night qualifying inpatient stay so insurance would not cover SNF stay, if recommended. Per daughter, patient is unable to pay privately and does not qualify for Medicaid. Per daughter, patient receives VA benefits and was informed that if patient is taken to [facility] hospital they will admit him inpatient and then find him placement. CSW unsure if this is true. Patient still to be seen by EDP.  CSW to leave a handoff for 2nd shift CSW.  [name], [place] Work Department  [employer]  [phone]

## 2018-08-04 NOTE — ED Provider Notes (Signed)
[facility] DEPT Provider Note   CSN: [id number] Arrival date & time: 08/04/18  1346     History   Chief Complaint Chief Complaint  Patient presents with  . Weakness    HPI [name] is a 84 y.o. male.  83 year old male with history of A. fib on Eliquis presents from home due to concern by neighbors that patient cannot perform his ADLs.  Patient does have home health aide according to the old chart.  Patient states he feels at his baseline state of health and does live by himself.  He denies any headache, chest discomfort, abdominal discomfort.  No vomiting or diarrhea.  No fever chills.  No urinary symptoms.  States he cooks for himself and last meal that he may for himself was eggs.     Past Medical History:  Diagnosis Date  . Atrial fibrillation ([place])   . CAP (community acquired pneumonia) 02/16/2018  . Chronic a-fib    Archie Endo 09/20/2017  . CKD (chronic kidney disease), stage III ([place])    Archie Endo 02/17/2018  . History of hiatal hernia    Archie Endo 09/20/2017  . Hyperlipidemia   . Hypertension   . Stroke ([place]) 04/20/2008   Archie Endo 01/23/2010  . Stroke [facility] 11/2013   Large posterior and inferior right MCA stroke/infarct/notes 12/22/2013    Patient Active Problem List   Diagnosis Date Noted  . Involuntary commitment   . CAP (community acquired pneumonia) 02/17/2018  . CKD (chronic kidney disease), stage III ([place]) 02/17/2018  . Rib pain on right side 09/20/2017  . Chest pain, pleuritic 09/20/2017  . Abdominal pain 09/17/2017  . Hyperlipidemia 09/17/2017  . Hypertension 09/17/2017  . Anemia 09/17/2017  . Atrial fibrillation, chronic 11/29/2013  . CVA (cerebral infarction) 11/29/2013  . OTHER VITAMIN B12 DEFICIENCY ANEMIA 01/08/2008  . ANAL FISTULA 01/08/2008    Past Surgical History:  Procedure Laterality Date  . ANAL FISTULOTOMY  10/2006   Archie Endo 03/13/2011  . JOINT REPLACEMENT    . ROTATOR CUFF REPAIR Left 08/1995   Archie Endo  02/27/2011  . TOTAL KNEE ARTHROPLASTY Right 03/2007   Archie Endo 02/27/2011  . TOTAL KNEE ARTHROPLASTY Left 03/2009   Archie Endo 02/27/2011        Home Medications    Prior to Admission medications   Medication Sig Start Date End Date Taking? Authorizing Provider  apixaban (ELIQUIS) 2.5 MG TABS tablet Take 1 tablet (2.5 mg total) by mouth 2 (two) times daily. Patient not taking: Reported on 06/16/2018 02/17/18   Geradine Girt, DO  pantoprazole (PROTONIX) 40 MG tablet Take 1 tablet (40 mg total) by mouth 2 (two) times daily. Patient not taking: Reported on 04/02/2018 09/20/17   Charlynne Cousins, MD  pravastatin (PRAVACHOL) 20 MG tablet Take 1 tablet (20 mg total) by mouth at bedtime. Patient not taking: Reported on 04/02/2018 12/10/13   Cathlyn Parsons, PA-C    Family History Family History  Problem Relation Age of Onset  . Cancer Mother     Social History Social History   Tobacco Use  . Smoking status: Never Smoker  . Smokeless tobacco: Never Used  Substance Use Topics  . Alcohol use: No  . Drug use: No     Allergies   Codeine   Review of Systems Review of Systems  All other systems reviewed and are negative.    Physical Exam Updated Vital Signs BP 110/81 (BP Location: Left Arm)   Pulse 68   Temp (!) 97.3 F (36.3  C) (Oral)   Resp 16   SpO2 98%   Physical Exam  Constitutional: He is oriented to person, place, and time. He appears well-nourished. He appears cachectic.  Non-toxic appearance. No distress.  HENT:  Head: Normocephalic and atraumatic.  Eyes: Pupils are equal, round, and reactive to light. Conjunctivae, EOM and lids are normal.  Neck: Normal range of motion. Neck supple. No tracheal deviation present. No thyroid mass present.  Cardiovascular: Normal rate, regular rhythm and normal heart sounds. Exam reveals no gallop.  No murmur heard. Pulmonary/Chest: Effort normal and breath sounds normal. No stridor. No respiratory distress. He has no decreased  breath sounds. He has no wheezes. He has no rhonchi. He has no rales.  Abdominal: Soft. Normal appearance and bowel sounds are normal. He exhibits no distension. There is no tenderness. There is no rebound and no CVA tenderness.  Musculoskeletal: Normal range of motion. He exhibits no edema or tenderness.  Neurological: He is alert and oriented to person, place, and time. He displays atrophy. No cranial nerve deficit or sensory deficit. GCS eye subscore is 4. GCS verbal subscore is 5. GCS motor subscore is 6.  Skin: Skin is warm and dry. No abrasion and no rash noted.  Psychiatric: He has a normal mood and affect. His speech is normal and behavior is normal.  Nursing note and vitals reviewed.    ED Treatments / Results  Labs (all labs ordered are listed, but only abnormal results are displayed) Labs Reviewed  URINE CULTURE  CBC WITH DIFFERENTIAL/PLATELET  BASIC METABOLIC PANEL  URINALYSIS, ROUTINE W REFLEX MICROSCOPIC  CBG MONITORING, ED    EKG None  Radiology No results found.  Procedures Procedures (including critical care time)  Medications Ordered in ED Medications  sodium chloride 0.9 % bolus 1,000 mL (has no administration in time range)  0.9 %  sodium chloride infusion (has no administration in time range)     Initial Impression / Assessment and Plan / ED Course  I have reviewed the triage vital signs and the nursing notes.  Pertinent labs & imaging results that were available during my care of the patient were reviewed by me and considered in my medical decision making (see chart for details).     Patient here with acute kidney injury likely from dehydration as he lives by himself and is not been able to take care of his ADLs.  Given IV fluids here.  Will admit to the hospitalist service  Final Clinical Impressions(s) / ED Diagnoses   Final diagnoses:  None    ED Discharge Orders    None       Lacretia Leigh, MD 08/04/18 1904

## 2018-08-04 NOTE — ED Notes (Signed)
Our social worker has briefly seen pt. And is making enquiries as to what his guardian/p.o.a. Desire for him.

## 2018-08-04 NOTE — ED Triage Notes (Signed)
Daughter (Legal Guardian) [name] [phone] (lives in [place]). [name] [phone] is a Education officer, museum with "[employer]"; who are also involved with his case.

## 2018-08-04 NOTE — ED Notes (Signed)
ED TO INPATIENT HANDOFF REPORT  Name/Age/Gender Domenic Schwab 84 y.o. male  Code Status Code Status History    Date Active Date Inactive Code Status Order ID Comments User Context   04/09/2018 2138 04/10/2018 1924 Full Code [id number]  Valarie Merino, MD ED   02/17/2018 0058 02/18/2018 1918 DNR [id number]  Vianne Bulls, MD ED   09/20/2017 0127 09/20/2017 1802 DNR [id number]  Gwynne Edinger, MD Inpatient   09/17/2017 0921 09/17/2017 2227 Full Code [id number]  Reubin Milan, MD Inpatient   12/02/2013 1714 12/10/2013 1433 Full Code [id number]  Cathlyn Parsons, PA-C Inpatient   11/29/2013 1835 12/02/2013 1714 Full Code [id number]  Adrian Prows, MD ED      Home/SNF/Other Home  Chief Complaint failue to thrive  Level of Care/Admitting Diagnosis ED Disposition    ED Disposition Condition [place]: [facility] [[id number]]  Level of Care: Telemetry [5]  Admit to tele based on following criteria: Complex arrhythmia (Bradycardia/Tachycardia)  Diagnosis: Dehydration [276.51.ICD-9-CM]  Admitting Physician: Vianne Bulls [[id number]]  Attending Physician: Vianne Bulls [[id number]]  PT Class (Do Not Modify): Observation [104]  PT Acc Code (Do Not Modify): Observation [10022]       Medical History Past Medical History:  Diagnosis Date  . Atrial fibrillation ([place])   . CAP (community acquired pneumonia) 02/16/2018  . Chronic a-fib    Archie Endo 09/20/2017  . CKD (chronic kidney disease), stage III ([place])    Archie Endo 02/17/2018  . History of hiatal hernia    Archie Endo 09/20/2017  . Hyperlipidemia   . Hypertension   . Stroke ([place]) 04/20/2008   Archie Endo 01/23/2010  . Stroke ([place]) 11/2013   Large posterior and inferior right MCA stroke/infarct/notes 12/22/2013    Allergies Allergies  Allergen Reactions  . Codeine Nausea And Vomiting    IV Location/Drains/Wounds Patient Lines/Drains/Airways Status   Active Line/Drains/Airways    Name:   Placement  date:   Placement time:   Site:   Days:   Peripheral IV 08/04/18 Right Antecubital   08/04/18    1606    Antecubital   less than 1          Labs/Imaging Results for orders placed or performed during the hospital encounter of 08/04/18 (from the past 48 hour(s))  CBG monitoring, ED     Status: None   Collection Time: 08/04/18  2:28 PM  Result Value Ref Range   Glucose-Capillary 84 70 - 99 mg/dL  CBC with Differential/Platelet     Status: None   Collection Time: 08/04/18  4:08 PM  Result Value Ref Range   WBC 6.9 4.0 - 10.5 K/uL    Comment: WHITE COUNT CONFIRMED ON SMEAR   RBC 4.42 4.22 - 5.81 MIL/uL   Hemoglobin 13.8 13.0 - 17.0 g/dL   HCT 42.3 39.0 - 52.0 %   MCV 95.7 80.0 - 100.0 fL   MCH 31.2 26.0 - 34.0 pg   MCHC 32.6 30.0 - 36.0 g/dL   RDW 13.0 11.5 - 15.5 %   Platelets 192 150 - 400 K/uL   nRBC 0.0 0.0 - 0.2 %   Neutrophils Relative % 78 %   Neutro Abs 5.3 1.7 - 7.7 K/uL   Lymphocytes Relative 15 %   Lymphs Abs 1.0 0.7 - 4.0 K/uL   Monocytes Relative 5 %   Monocytes Absolute 0.4 0.1 - 1.0 K/uL   Eosinophils Relative 2 %   Eosinophils Absolute 0.2 0.0 -  0.5 K/uL   Basophils Relative 0 %   Basophils Absolute 0.0 0.0 - 0.1 K/uL   Immature Granulocytes 0 %   Abs Immature Granulocytes 0.01 0.00 - 0.07 K/uL    Comment: Performed at [facility], [place] [address], [place], [place] [address]  Basic metabolic panel     Status: Abnormal   Collection Time: 08/04/18  4:08 PM  Result Value Ref Range   Sodium 141 135 - 145 mmol/L   Potassium 4.8 3.5 - 5.1 mmol/L   Chloride 104 98 - 111 mmol/L   CO2 26 22 - 32 mmol/L   Glucose, Bld 93 70 - 99 mg/dL   BUN 43 (H) 8 - 23 mg/dL   Creatinine, Ser 1.74 (H) 0.61 - 1.24 mg/dL   Calcium 9.9 8.9 - 10.3 mg/dL   GFR calc non Af Amer 32 (L) >60 mL/min   GFR calc Af Amer 37 (L) >60 mL/min    Comment: (NOTE) The eGFR has been calculated using the CKD EPI equation. This calculation has not been validated in all clinical  situations. eGFR's persistently <60 mL/min signify possible Chronic Kidney Disease.    Anion gap 11 5 - 15    Comment: Performed at [facility], [place] [address] [place], [place] [address]  Urinalysis, Routine w reflex microscopic     Status: Abnormal   Collection Time: 08/04/18  7:19 PM  Result Value Ref Range   Color, Urine YELLOW YELLOW   APPearance CLEAR CLEAR   Specific Gravity, Urine 1.020 1.005 - 1.030   pH 5.0 5.0 - 8.0   Glucose, UA NEGATIVE NEGATIVE mg/dL   Hgb urine dipstick NEGATIVE NEGATIVE   Bilirubin Urine NEGATIVE NEGATIVE   Ketones, ur 5 (A) NEGATIVE mg/dL   Protein, ur NEGATIVE NEGATIVE mg/dL   Nitrite NEGATIVE NEGATIVE   Leukocytes, UA TRACE (A) NEGATIVE   RBC / HPF 0-5 0 - 5 RBC/hpf   WBC, UA 0-5 0 - 5 WBC/hpf   Bacteria, UA NONE SEEN NONE SEEN   Squamous Epithelial / LPF 0-5 0 - 5   Mucus PRESENT    Hyaline Casts, UA PRESENT     Comment: Performed at [facility], [place] [address] [place],  [address]   No results found. EKG Interpretation  Date/Time:  Tuesday August 04 2018 16:03:45 EDT Ventricular Rate:  112 PR Interval:    QRS Duration: 97 QT Interval:  359 QTC Calculation: 490 R Axis:   86 Text Interpretation:  Atrial fibrillation Borderline right axis deviation Borderline T abnormalities, inferior leads Borderline prolonged QT interval No significant change since last tracing Confirmed by Lacretia Leigh ([id number]) on 08/04/2018 5:02:53 PM   Pending Labs Unresulted Labs (From admission, onward)    Start     Ordered   08/04/18 1510  Urine Culture  STAT,   STAT     08/04/18 1509          Vitals/Pain Today's Vitals   08/04/18 1900 08/04/18 2000 08/04/18 2015 08/04/18 2028  BP: 116/75 109/77  109/77  Pulse: 93  (!) 30 (!) 137  Resp: '18 15 19 16  '$ Temp:      TempSrc:      SpO2: 92%  98% 100%  PainSc:        Isolation Precautions No active isolations  Medications Medications  0.9 %  sodium  chloride infusion ( Intravenous New Bag/Given 08/04/18 1803)  metoprolol tartrate (LOPRESSOR) injection 2.5 mg (has no administration in time  range)  sodium chloride 0.9 % bolus 1,000 mL (0 mLs Intravenous Stopped 08/04/18 1801)    Mobility walks

## 2018-08-04 NOTE — ED Notes (Signed)
Bed: WA07 Expected date:  Expected time:  Means of arrival:  Comments: EMS-FTT/placement-

## 2018-08-04 NOTE — Care Management (Addendum)
[facility] ED CM reviewed patient's record to assist ED CSW with transitional care planning.

## 2018-08-04 NOTE — Progress Notes (Signed)
Known Afib, on telemetry, will monitor, pt in no distress

## 2018-08-04 NOTE — ED Triage Notes (Signed)
A concerned neighbor phoned EMS r/t concern that pt. (who lives alone) cannot adequately perform his ADL's. He has no somatic complaints and is in no distress. He is a bit unkempt, and is cachectic in appearance.

## 2018-08-04 NOTE — Progress Notes (Signed)
Pt has been admitted from the ED d/t dehydration, orders noted, multiple attempts to explain to pt why he is here lead pt to stating he has CRS " cant remember shit" Pleasantly confused, HOH, cachectic appearance. MASD at umbilicus &  Between several toes. Upset that neighbors sent him to the hospital against his will.

## 2018-08-04 NOTE — ED Notes (Signed)
Tried to call report and they moved patient room, Secretary told me to call back in 15 min

## 2018-08-04 NOTE — Progress Notes (Addendum)
[facility] CSW received handoff from daytime CSW. CSW left voicemail for [name] with [facility] at [phone] to see what their plan is for pt.   As of now, pt's legal guardian reported that pt does not have means to pay for placement privately and pt does not have 3 night qualifying stay for Medicare to pay for SNF.   CSW will continue to follow.   Update: CSW spoke with legal guardian, [name] at [phone]. Per daughter, have been working to get pt placed for the last year. In February, pt agreed to come to [place] with daughters, but then changed his mind. Pt's legal guardian no longer comfortable with transporting pt in car due to behaviors and mental health status. Pt's daughter stated that she was told if pt went to a [place] and was admitted psychiatrically they would be able to seek placement. Pt does not currently receive VA benefits. Legal guardian not agreeable to pt being discharged home. Pt receives $1,642 a month via social security. Pt receives too much for Medicaid and does not receive enough to pay privately for ALF/ Memory Care. Pt does not meet requirement for insurance to pay for SNF at this point. Friend who called EMS earlier today is [name]. If needed pt's daughter has [name]'s number, did not feel comfortable giving it now.   CSW spoke with Social Work leadership, [name] about case. Leadership suggested looking for [facility] for pt to transition to when medically cleared. CSW will reach out to [facility] in the area.   CSW spoke with [name] with [facility]. [name] stated that she has been speaking with [place] and [facility] in [place] for possible placement options. Per [name] is the contact at [facility]. [place] on [employer] has a program with the [place] that uses a [employer] to pay for care for long term nursing until Aid and Assistance funding through the [employer] in. Contact at the [facility] is [name] at [phone]. Per [name], family willing to pay out of pocket for facility if long term skilled medicaid is being applied for by facility/ until medicaid begins coverage. [name] with [facility] willing to assist family in applying for long term medicaid.   CSW updated legal guardian, [name], that pt is being kept for observations. CSW clarified with family member that he is not being admitted, so observation stay does not qualify for Medicaid to cover SNF.   Plan: CSW will leave handoff for daytime CSW to follow up with previously mentioned facilities and [name] with [facility].   [name], [name]  [phone]

## 2018-08-04 NOTE — H&P (Signed)
History and Physical    [name] SWF:[id number] DOB: 12-Aug-1922 DOA: 08/04/2018  PCP: [facility]   Patient coming from: Home   Chief Complaint: Neighbors concerned he's not able to care for himself   HPI: [name] is a 84 y.o. male with medical history significant for chronic kidney disease stage III, chronic atrial fibrillation no longer on anticoagulation, and history of CVA, now presenting to the emergency department after neighbor called EMS to report concern that the patient was unable to care for himself.  Patient has no complaints, denies any nausea, vomiting, or diarrhea.  Reports that he has maintained a good appetite and had just cooked eggs for himself.  He denies any recent falls, focal numbness or weakness, chest pain, or shortness of breath.  States that he does not want to take medications anymore and is no longer taking his Eliquis or statin.  ED Course: Upon arrival to the ED, patient is found to be afebrile, saturating well on room air, slightly tachycardic, and with stable blood pressure.  EKG features atrial fibrillation with rate 112.  Chemistry panel is notable for a creatinine 1.74, up from 1.47 in August.  CBC is unremarkable.  Patient was given a liter of normal saline in the ED.  Tachycardia improved with the IV fluids, blood pressure remained stable, and the patient will be observed for ongoing evaluation and management.  Review of Systems:  All other systems reviewed and apart from HPI, are negative.  Past Medical History:  Diagnosis Date  . Atrial fibrillation ([place])   . CAP (community acquired pneumonia) 02/16/2018  . Chronic a-fib    Archie Endo 09/20/2017  . CKD (chronic kidney disease), stage III ([place])    Archie Endo 02/17/2018  . History of hiatal hernia    Archie Endo 09/20/2017  . Hyperlipidemia   . Hypertension   . Stroke ([place]) 04/20/2008   Archie Endo 01/23/2010  . Stroke [facility]) 11/2013   Large posterior and inferior right MCA  stroke/infarct/notes 12/22/2013    Past Surgical History:  Procedure Laterality Date  . ANAL FISTULOTOMY  10/2006   Archie Endo 03/13/2011  . JOINT REPLACEMENT    . ROTATOR CUFF REPAIR Left 08/1995   Archie Endo 02/27/2011  . TOTAL KNEE ARTHROPLASTY Right 03/2007   Archie Endo 02/27/2011  . TOTAL KNEE ARTHROPLASTY Left 03/2009   Archie Endo 02/27/2011     reports that he has never smoked. He has never used smokeless tobacco. He reports that he does not drink alcohol or use drugs.  Allergies  Allergen Reactions  . Codeine Nausea And Vomiting    Family History  Problem Relation Age of Onset  . Cancer Mother      Prior to Admission medications   Medication Sig Start Date End Date Taking? Authorizing Provider  apixaban (ELIQUIS) 2.5 MG TABS tablet Take 1 tablet (2.5 mg total) by mouth 2 (two) times daily. Patient not taking: Reported on 06/16/2018 02/17/18   Geradine Girt, DO  pantoprazole (PROTONIX) 40 MG tablet Take 1 tablet (40 mg total) by mouth 2 (two) times daily. Patient not taking: Reported on 04/02/2018 09/20/17   Charlynne Cousins, MD  pravastatin (PRAVACHOL) 20 MG tablet Take 1 tablet (20 mg total) by mouth at bedtime. Patient not taking: Reported on 04/02/2018 12/10/13   Cathlyn Parsons, PA-C    Physical Exam: Vitals:   08/04/18 1900 08/04/18 2000 08/04/18 2015 08/04/18 2028  BP: 116/75 109/77  109/77  Pulse: 93  (!) 30 (!) 137  Resp: 18  15 19 16   Temp:      TempSrc:      SpO2: 92%  98% 100%     Constitutional: NAD, calm, frail appearing Eyes: PERTLA, lids and conjunctivae normal ENMT: Mucous membranes are moist. Posterior pharynx clear of any exudate or lesions.   Neck: normal, supple, no masses, no thyromegaly Respiratory: clear to auscultation bilaterally, no wheezing, no crackles. Normal respiratory effort.    Cardiovascular: Rate ~100 and irregular. No extremity edema.  Abdomen: No distension, no tenderness, soft. Bowel sounds normal.  Musculoskeletal: no clubbing /  cyanosis. No joint deformity upper and lower extremities.    Skin: no significant rashes, lesions, ulcers. Poor turgor. Neurologic: No facial asymmetry. Sensation intact. Moving all extremities.  Psychiatric: Alert and oriented to person, place, and situation. Very pleasant, cooperative.    Labs on Admission: I have personally reviewed following labs and imaging studies  CBC: Recent Labs  Lab 08/04/18 1608  WBC 6.9  NEUTROABS 5.3  HGB 13.8  HCT 42.3  MCV 95.7  PLT 599   Basic Metabolic Panel: Recent Labs  Lab 08/04/18 1608  NA 141  K 4.8  CL 104  CO2 26  GLUCOSE 93  BUN 43*  CREATININE 1.74*  CALCIUM 9.9   GFR: CrCl cannot be calculated (Unknown ideal weight.). Liver Function Tests: No results for input(s): AST, ALT, ALKPHOS, BILITOT, PROT, ALBUMIN in the last 168 hours. No results for input(s): LIPASE, AMYLASE in the last 168 hours. No results for input(s): AMMONIA in the last 168 hours. Coagulation Profile: No results for input(s): INR, PROTIME in the last 168 hours. Cardiac Enzymes: No results for input(s): CKTOTAL, CKMB, CKMBINDEX, TROPONINI in the last 168 hours. BNP (last 3 results) No results for input(s): PROBNP in the last 8760 hours. HbA1C: No results for input(s): HGBA1C in the last 72 hours. CBG: Recent Labs  Lab 08/04/18 1428  GLUCAP 84   Lipid Profile: No results for input(s): CHOL, HDL, LDLCALC, TRIG, CHOLHDL, LDLDIRECT in the last 72 hours. Thyroid Function Tests: No results for input(s): TSH, T4TOTAL, FREET4, T3FREE, THYROIDAB in the last 72 hours. Anemia Panel: No results for input(s): VITAMINB12, FOLATE, FERRITIN, TIBC, IRON, RETICCTPCT in the last 72 hours. Urine analysis:    Component Value Date/Time   COLORURINE YELLOW 08/04/2018 1919   APPEARANCEUR CLEAR 08/04/2018 1919   LABSPEC 1.020 08/04/2018 1919   PHURINE 5.0 08/04/2018 1919   GLUCOSEU NEGATIVE 08/04/2018 1919   HGBUR NEGATIVE 08/04/2018 [place] NEGATIVE  08/04/2018 1919   KETONESUR 5 (A) 08/04/2018 1919   PROTEINUR NEGATIVE 08/04/2018 1919   UROBILINOGEN 0.2 11/29/2013 1619   NITRITE NEGATIVE 08/04/2018 1919   LEUKOCYTESUR TRACE (A) 08/04/2018 1919   Sepsis Labs: @LABRCNTIP (procalcitonin:4,lacticidven:4) )No results found for this or any previous visit (from the past 240 hour(s)).   Radiological Exams on Admission: No results found.  EKG: Independently reviewed. Atrial fibrillation, rate 112.   Assessment/Plan   1. Dehydration  - Presents from home d/t concern from neighbors that he is not taking adequate care of himself  - Patient denies vomiting or diarrhea, denies poor appetite, and has no complaints   - Appears hypovolemic and has increase in BUN and creatinine  - He was treated with 1 liter NS in ED and continued on IVF hydration  - Continue IVF, repeat chemistries in am, PT consulted for eval and tx   2. CKD stage III  - SCr is 1.74 on admission, up from 1.47 in August  - Treated  with 1 liter NS in ED and continued on IVF hydration  - Renally-dose medications, avoid nephrotoxins, and repeat chem panel in am   3. Atrial fibrillation  - CHADS-VASc is 49 (age x2, CVA x2)  - Prescribed Eliquis, but patient elected to stop taking, seems to understand the risks/benefits  - Rate in low 100's in ED, suspected secondary to hypovolemia  - Use [place] IVP's as-needed if rate fails to improve with IVF     DVT prophylaxis: sq heparin  Code Status: DNR, confirmed with patient  Family Communication: Discussed with patient  Consults called: None Admission status: observation     Vianne Bulls, MD [facility] Pager [phone]  If 7PM-7AM, please contact night-coverage [email or web address] Password [facility]  08/04/2018, 8:53 PM

## 2018-08-05 ENCOUNTER — Inpatient Hospital Stay (HOSPITAL_COMMUNITY): Payer: Medicare Other

## 2018-08-05 DIAGNOSIS — N179 Acute kidney failure, unspecified: Secondary | ICD-10-CM | POA: Diagnosis not present

## 2018-08-05 DIAGNOSIS — E876 Hypokalemia: Secondary | ICD-10-CM | POA: Diagnosis present

## 2018-08-05 DIAGNOSIS — Z885 Allergy status to narcotic agent status: Secondary | ICD-10-CM | POA: Diagnosis not present

## 2018-08-05 DIAGNOSIS — R262 Difficulty in walking, not elsewhere classified: Secondary | ICD-10-CM | POA: Diagnosis not present

## 2018-08-05 DIAGNOSIS — I482 Chronic atrial fibrillation, unspecified: Secondary | ICD-10-CM | POA: Diagnosis not present

## 2018-08-05 DIAGNOSIS — D519 Vitamin B12 deficiency anemia, unspecified: Secondary | ICD-10-CM | POA: Diagnosis present

## 2018-08-05 DIAGNOSIS — M6281 Muscle weakness (generalized): Secondary | ICD-10-CM | POA: Diagnosis not present

## 2018-08-05 DIAGNOSIS — I10 Essential (primary) hypertension: Secondary | ICD-10-CM | POA: Diagnosis not present

## 2018-08-05 DIAGNOSIS — Z66 Do not resuscitate: Secondary | ICD-10-CM | POA: Diagnosis present

## 2018-08-05 DIAGNOSIS — R279 Unspecified lack of coordination: Secondary | ICD-10-CM | POA: Diagnosis not present

## 2018-08-05 DIAGNOSIS — E860 Dehydration: Secondary | ICD-10-CM | POA: Diagnosis not present

## 2018-08-05 DIAGNOSIS — I6990 Unspecified sequelae of unspecified cerebrovascular disease: Secondary | ICD-10-CM | POA: Diagnosis not present

## 2018-08-05 DIAGNOSIS — R402362 Coma scale, best motor response, obeys commands, at arrival to emergency department: Secondary | ICD-10-CM | POA: Diagnosis present

## 2018-08-05 DIAGNOSIS — N183 Chronic kidney disease, stage 3 (moderate): Secondary | ICD-10-CM | POA: Diagnosis not present

## 2018-08-05 DIAGNOSIS — Z743 Need for continuous supervision: Secondary | ICD-10-CM | POA: Diagnosis not present

## 2018-08-05 DIAGNOSIS — Z8673 Personal history of transient ischemic attack (TIA), and cerebral infarction without residual deficits: Secondary | ICD-10-CM | POA: Diagnosis not present

## 2018-08-05 DIAGNOSIS — K449 Diaphragmatic hernia without obstruction or gangrene: Secondary | ICD-10-CM | POA: Diagnosis present

## 2018-08-05 DIAGNOSIS — Z96653 Presence of artificial knee joint, bilateral: Secondary | ICD-10-CM | POA: Diagnosis present

## 2018-08-05 DIAGNOSIS — Z681 Body mass index (BMI) 19 or less, adult: Secondary | ICD-10-CM | POA: Diagnosis not present

## 2018-08-05 DIAGNOSIS — R402252 Coma scale, best verbal response, oriented, at arrival to emergency department: Secondary | ICD-10-CM | POA: Diagnosis present

## 2018-08-05 DIAGNOSIS — L89152 Pressure ulcer of sacral region, stage 2: Secondary | ICD-10-CM | POA: Diagnosis present

## 2018-08-05 DIAGNOSIS — E7849 Other hyperlipidemia: Secondary | ICD-10-CM | POA: Diagnosis not present

## 2018-08-05 DIAGNOSIS — E861 Hypovolemia: Secondary | ICD-10-CM | POA: Diagnosis present

## 2018-08-05 DIAGNOSIS — I129 Hypertensive chronic kidney disease with stage 1 through stage 4 chronic kidney disease, or unspecified chronic kidney disease: Secondary | ICD-10-CM | POA: Diagnosis present

## 2018-08-05 DIAGNOSIS — E785 Hyperlipidemia, unspecified: Secondary | ICD-10-CM | POA: Diagnosis present

## 2018-08-05 DIAGNOSIS — Z7901 Long term (current) use of anticoagulants: Secondary | ICD-10-CM | POA: Diagnosis not present

## 2018-08-05 DIAGNOSIS — J189 Pneumonia, unspecified organism: Secondary | ICD-10-CM | POA: Diagnosis not present

## 2018-08-05 DIAGNOSIS — G459 Transient cerebral ischemic attack, unspecified: Secondary | ICD-10-CM | POA: Diagnosis not present

## 2018-08-05 DIAGNOSIS — R41841 Cognitive communication deficit: Secondary | ICD-10-CM | POA: Diagnosis not present

## 2018-08-05 DIAGNOSIS — G4700 Insomnia, unspecified: Secondary | ICD-10-CM | POA: Diagnosis not present

## 2018-08-05 DIAGNOSIS — Z809 Family history of malignant neoplasm, unspecified: Secondary | ICD-10-CM | POA: Diagnosis not present

## 2018-08-05 DIAGNOSIS — R402142 Coma scale, eyes open, spontaneous, at arrival to emergency department: Secondary | ICD-10-CM | POA: Diagnosis present

## 2018-08-05 DIAGNOSIS — R531 Weakness: Secondary | ICD-10-CM | POA: Diagnosis not present

## 2018-08-05 DIAGNOSIS — E43 Unspecified severe protein-calorie malnutrition: Secondary | ICD-10-CM | POA: Diagnosis not present

## 2018-08-05 LAB — BASIC METABOLIC PANEL
Anion gap: 11 (ref 5–15)
BUN: 35 mg/dL — ABNORMAL HIGH (ref 8–23)
CO2: 23 mmol/L (ref 22–32)
Calcium: 8.9 mg/dL (ref 8.9–10.3)
Chloride: 106 mmol/L (ref 98–111)
Creatinine, Ser: 1.54 mg/dL — ABNORMAL HIGH (ref 0.61–1.24)
GFR calc Af Amer: 43 mL/min — ABNORMAL LOW (ref >60)
GFR calc non Af Amer: 37 mL/min — ABNORMAL LOW (ref >60)
Glucose, Bld: 77 mg/dL (ref 70–99)
Potassium: 4.3 mmol/L (ref 3.5–5.1)
Sodium: 140 mmol/L (ref 135–145)

## 2018-08-05 MED ORDER — PANTOPRAZOLE SODIUM 40 MG PO TBEC
40.0000 mg | DELAYED_RELEASE_TABLET | Freq: Two times a day (BID) | ORAL | Status: DC
  Administered 2018-08-05 – 2018-08-10 (×10): 40 mg via ORAL
  Filled 2018-08-05 (×10): qty 1

## 2018-08-05 MED ORDER — SODIUM CHLORIDE 0.9 % IV SOLN
INTRAVENOUS | Status: DC
  Administered 2018-08-05 – 2018-08-09 (×6): via INTRAVENOUS

## 2018-08-05 MED ORDER — MELATONIN 3 MG PO TABS
3.0000 mg | ORAL_TABLET | ORAL | Status: DC
  Administered 2018-08-05 – 2018-08-09 (×5): 3 mg via ORAL
  Filled 2018-08-05 (×5): qty 1

## 2018-08-05 NOTE — Progress Notes (Addendum)
[facility] PROGRESS NOTE    Progress Note  LETCHER SCHWEIKERT  XNT:[id number] DOB: 04/05/1922 DOA: 08/04/2018 [facility]: [facility]     Brief Narrative:   JERRELLE MICHELSEN is an 84 y.o. male past medical history of chronic kidney disease stage III, chronic atrial fibrillation not on anticoagulation, history of CVA presents to the emergency room as patient was unable to care for himself.  In the ED he was found afebrile saturating well with possibly atrial fibrillation, his heart rate improved in the ED after IV fluid.  Assessment/Plan:   Dehydration Denies any vomiting diarrhea or poor appetite. He seems hypovolemic due to rise in BUN and creatinine is slowly improving with IV fluid hydration.  AKI on chronic kidney disease stage III: Likely due to prerenal etiology improving with IV fluid hydration. Baseline creatinine around 1.3.    Atrial fibrillation, chronic CHADS-VASc is 62 (age x2, CVA x2)  He refused using his Eliquis he understands risk and benefits.  Capacity: The patient just not seem to have any insight on his medical condition and his outcomes.  And what his choices would lead to.  He is also not able to communicate the choices that I have explained to him.   -He could not tell me on his own words his medical conditions, he relates that his medication and the care or assistance healthcare and Meals on Wheels can provide him will not help him. Speaking to the daughters he has been hallucinating at home regularly, he is called his daughter sheryl crying and screaming that his other daughter has died in a motor vehicle accident, when [name] ask how he knows he has told her that God has come down and giving him the gift of seeing the future. Speaking to Romie Minus his all the daughte who is the healthcare power of attorney, she has been trying to get him to a facility, but has been unsuccessful.  Every time she tries to get Meals on Wheels hold home health aide at home he  refuses to let him in.  She is also complained that the person who checks on him at home his phone call cases of animals at his house. Patient obviously lacks capacity to make medical decision.   DVT prophylaxis: lovenox Family Communication: Romie Minus [phone] Disposition Plan/Barrier to D/C: snf Code Status:     Code Status Orders  (From admission, onward)         Start     Ordered   08/04/18 2051  Do not attempt resuscitation (DNR)  Continuous    Question Answer Comment  In the event of cardiac or respiratory ARREST Do not call a "code blue"   In the event of cardiac or respiratory ARREST Do not perform Intubation, CPR, defibrillation or ACLS   In the event of cardiac or respiratory ARREST Use medication by any route, position, wound care, and other measures to relive pain and suffering. May use oxygen, suction and manual treatment of airway obstruction as needed for comfort.      08/04/18 2052        Code Status History    Date Active Date Inactive Code Status Order ID Comments User Context   04/09/2018 2138 04/10/2018 1924 Full Code [id number]  Valarie Merino, MD ED   02/17/2018 0058 02/18/2018 1918 DNR [id number]  Vianne Bulls, MD ED   09/20/2017 0127 09/20/2017 1802 DNR [id number]  Gwynne Edinger, MD Inpatient   09/17/2017 [phone] 09/17/2017 2227 Full Code [id number]  Reubin Milan, MD Inpatient   12/02/2013 1714 12/10/2013 1433 Full Code [id number]  Elizabeth Sauer Inpatient   11/29/2013 1835 12/02/2013 1714 Full Code [id number]  Adrian Prows, MD ED        IV Access:    Peripheral IV   Procedures and diagnostic studies:   No results found.   Medical Consultants:    None.  Anti-Infectives:  None  Subjective:    SEBASTIN PERLMUTTER has no complaints.  Objective:    Vitals:   08/04/18 2100 08/04/18 2141 08/04/18 2144 08/05/18 0408  BP: 136/90  (!) 130/97 97/66  Pulse:   (!) 124 71  Resp: 14  16 18   Temp:   97.8 F (36.6 C) 97.6 F (36.4 C)   TempSrc:   Oral Oral  SpO2:   99% 100%  Weight:  47.8 kg    Height:  5\' 8"  (1.727 m)      Intake/Output Summary (Last 24 hours) at 08/05/2018 0848 Last data filed at 08/05/2018 0500 Gross per 24 hour  Intake 1240 ml  Output 125 ml  Net 1115 ml   Filed Weights   08/04/18 2141  Weight: 47.8 kg    Exam: General exam: In no acute distress. Respiratory system: Good air movement and clear to auscultation. Cardiovascular system: S1 & S2 heard, RRR.  Gastrointestinal system: Abdomen is nondistended, soft and nontender.  Central nervous system: Alert and oriented. No focal neurological deficits. Extremities: No pedal edema. Skin: No rashes, lesions or ulcers Psychiatry: Judgement and insight appear normal. Mood & affect appropriate.    Data Reviewed:    Labs: Basic Metabolic Panel: Recent Labs  Lab 08/04/18 1608 08/05/18 0522  NA 141 140  K 4.8 4.3  CL 104 106  CO2 26 23  GLUCOSE 93 77  BUN 43* 35*  CREATININE 1.74* 1.54*  CALCIUM 9.9 8.9   GFR Estimated Creatinine Clearance: 19.8 mL/min (A) (by C-G formula based on SCr of 1.54 mg/dL (H)). Liver Function Tests: No results for input(s): AST, ALT, ALKPHOS, BILITOT, PROT, ALBUMIN in the last 168 hours. No results for input(s): LIPASE, AMYLASE in the last 168 hours. No results for input(s): AMMONIA in the last 168 hours. Coagulation profile No results for input(s): INR, PROTIME in the last 168 hours.  CBC: Recent Labs  Lab 08/04/18 1608  WBC 6.9  NEUTROABS 5.3  HGB 13.8  HCT 42.3  MCV 95.7  PLT 192   Cardiac Enzymes: No results for input(s): CKTOTAL, CKMB, CKMBINDEX, TROPONINI in the last 168 hours. BNP (last 3 results) No results for input(s): PROBNP in the last 8760 hours. CBG: Recent Labs  Lab 08/04/18 1428  GLUCAP 84   D-Dimer: No results for input(s): DDIMER in the last 72 hours. Hgb A1c: No results for input(s): HGBA1C in the last 72 hours. Lipid Profile: No results for input(s): CHOL, HDL,  LDLCALC, TRIG, CHOLHDL, LDLDIRECT in the last 72 hours. Thyroid function studies: No results for input(s): TSH, T4TOTAL, T3FREE, THYROIDAB in the last 72 hours.  Invalid input(s): FREET3 Anemia work up: No results for input(s): VITAMINB12, FOLATE, FERRITIN, TIBC, IRON, RETICCTPCT in the last 72 hours. Sepsis Labs: Recent Labs  Lab 08/04/18 1608  WBC 6.9   Microbiology No results found for this or any previous visit (from the past 240 hour(s)).   Medications:   . feeding supplement (ENSURE ENLIVE)  237 mL Oral BID BM  . heparin  5,000 Units Subcutaneous Q8H  . sodium chloride flush  3  mL Intravenous Q12H   Continuous Infusions: . sodium chloride 100 mL/hr at 08/05/18 0315     LOS: 0 days   Charlynne Cousins  [facility] Pager [phone]  *Please refer to [email or web address], password [email or web address] to get updated schedule on who will round on this patient, as hospitalists switch teams weekly. If 7PM-7AM, please contact night-coverage at [email or web address], password [email or web address] for any overnight needs.  08/05/2018, 8:48 AM

## 2018-08-05 NOTE — Care Management Note (Signed)
Case Management Note  Patient Details  Name: [name] MRN: [id number] Date of Birth: [birth year]  Subjective/Objective:Admitted w/dehydration.From home alone.Noted-found on floor by neighbor, not eating well, malnourished.Hx: DNR, HOH. [facility].Per [facility] rep [place]-not active-no pcp in network, not active w/care connections. PT cons-await recc. CM/CSW following.                   Action/Plan:d/c plan SNF.   Expected Discharge Date:  08/05/18               Expected Discharge Plan:  Skilled Nursing Facility  In-House Referral:  Clinical Social Work  Discharge planning Services  CM Consult  Post Acute Care Choice:    Choice offered to:     DME Arranged:    DME Agency:     HH Arranged:    [place] Agency:     Status of Service:  In process, will continue to follow  If discussed at Long Length of Stay Meetings, dates discussed:    Additional Comments:  Dessa Phi, RN 08/05/2018, 11:02 AM

## 2018-08-05 NOTE — Consult Note (Addendum)
   [facility] Inpatient Consult   08/05/2018  [name] 1922/06/19 [id number]    [facility] Care Management referral received from ED RNCM. [name] Primary Care Provider is the [place]. The [facility] is not a [facility] provider. Chart reviewed and it does not appear there is a [facility] specialist involved on the care team either.   Spoke with inpatient RNCM to make aware that [name] is not eligible for [facility] Care Management at this time due to PCP not being a [facility] provider.   Also made inpatient RNCM aware that patient is not active with [facility]. Writer confirmed this with [name] with [facility].   Bryan Rolling, MSN-Ed, RN,BSN [facility] Liaison [phone]

## 2018-08-05 NOTE — Progress Notes (Signed)
Pt vomited up his Ensure. Occasionally choking with meals. MD notified and orders received. Eulas Post, RN

## 2018-08-05 NOTE — Progress Notes (Addendum)
[name] received phone call from [name], social worker with [facility] [phone], regarding patient. Per [name], she has been working with family to find placement for patient. Per notes, patient does not currently receive VA benefits. Per [name], patient receives [phone] a month via social security which is too much for Medicaid and not enough to pay privately for ALF/Memory Care. At this time patient does not meet requirement for insurance to pay for SNF.  Per [name] is the contact at [place]. Per [name] on [employer] has a program with the [place] that uses a [employer] to pay for care for long term nursing until Aid and Assistance funding through the [employer] in. Per [name], she was going to try and see if [name] would be able to meet and assess patient today. [name] received call from [place] during phone conversation and agreed to call [name] back after she spoke with [place].  [name], [place] Work Department  [employer]  [phone]

## 2018-08-05 NOTE — Progress Notes (Signed)
Patient's pcp-[facility] Janett Labella. Harrell Gave Jones-fax#[phone]. SW Maggie-following. They will put in an APS referral.

## 2018-08-05 NOTE — Clinical Social Work Note (Signed)
Clinical Social Work Assessment  Patient Details  Name: [name] MRN: [id number] Date of Birth: February 07, 1922  Date of referral:  08/05/18               Reason for consult:  Discharge Planning, Facility Placement                Permission sought to share information with:    Permission granted to share information::     Name::        Agency::     Relationship::     Contact Information:     Housing/Transportation Living arrangements for the past 2 months:  Single Family Home Source of Information:  Adult Children(Daughter/legal guardian Gae Bon)   ) Patient Interpreter Needed:  None Criminal Activity/Legal Involvement Pertinent to Current Situation/Hospitalization:  No - Comment as needed Significant Relationships:  Adult Children Lives with:  Self Do you feel safe going back to the place where you live?  No Need for family participation in patient care:  Yes (Comment)  Care giving concerns:  Patient from home alone. Patient has been deemed incompetent and has a legal guardian (daughter - Gae Bon). Patient's legal guardian reported that patient needs assistance with ADLs and that patient private pays for meals on wheels. Patient's daughter reported that patient sometimes misses meals because meals on wheels delivers before patient wakes up. Patient's daughter reported that his neighbors check on him periodically and that a neighbor reported that patient has had on the same clothes for two months. Patient's daughter reported that she and her sister live in [place] and are not able to visit patient frequently. Patient's daughter is working with [facility] to assist patient with placement from home. Per chart review, patient makes too much to qualify for medicaid and does not make enough to private pay for placement. CSW consulted for placement.   Social Worker assessment / plan:  CSW spoke with patient's daughter regarding discharge planning. Patient's daughter reported that  patient is in a "ugly ugly situation" and that they have been trying to find a resolution for over a year. Patient's daughter reported that patient has declined home health in the past and that they are currently paying patient's friend to come over and check on patient periodically. Patient's daughter reported that patient is not able to complete ADLs and reported that patient's friends reported that patient has been in the same clothes for 2 months. Patient's daughter reported that patient misses meal deliveries because he is not awake when the deliver sometimes. Patient's daughter working with [facility] to have patient placed.   CSW spoke with [facility] staff Glade Lloyd [phone]) who reported that [facility] is available to come assess patient. CSW followed up with [place] who agreed to come assess patient.  CSW updated patient's daughter. Patient's daughter agreed to [facility]F coming to assess patient for placement.   [facility] RN came to assess patient and reported that patient is appropriate for their facility. Staff agreed to have their business office follow up with patient's daughter.  CSW followed up with patient's daughter and informed that [facility] accepted patient. Patient's daughter reported that patient discharging to ALF is not an option because she is not agreeable to the [employer]. Patient's daughter explained that she is not agreeable to the loan because if patient dies before the application process is completed that she would be responsible for charges. Patient's daughter reported that it  is not financially an option. Patient's daughter reported that she wants patient to discharge to  [facility] SNF under Medicare benefit. CSW explained that patient is currently under observation status and that patient will need a 3 night inpatient qualifying stay for Medicare to cover SNF. Patient's daughter  hopeful that patient will qualify for a 3 night inpatient stay and reported that it is the only option.  Patient does not have a safe discharge plan and care needs are not being met at home. CSW made APS report.  CSW will continue to follow and assist with discharge planning.  Employment status:  Retired Forensic scientist:  Medicare PT Recommendations:  Not assessed at this time [place] / Referral to community resources:  Other (Comment Required)(Patient to be assessed by [facility]F)  Patient/Family's Response to care:  Patient's daughter upset about patient's status as an observation patient and wants patient switched to inpatient so his Medicare benefit can be utilized at [facility].   Patient/Family's Understanding of and Emotional Response to Diagnosis, Current Treatment, and Prognosis:  Patient engaged with CSW but not able to verbalize understanding of current treatment and care needs. Patient verbalized that he came to the hospital because his friend felt he was unstable at home. Patient denied needing any assistance at home. Patient's daughter also patient's legal guardian verbalized concerns about patient's lack of care at home and lack of finances to pay for placement. Patient's daughter expressed frustrations about patient needing placement. CSW provided active listening and validated patient's daughter's concerns.  Emotional Assessment Appearance:  Appears stated age Attitude/Demeanor/Rapport:  Engaged Affect (typically observed):  Appropriate, Calm Orientation:  Oriented to Self, Oriented to Place, Oriented to  Time, Oriented to Situation(Patient has been deemed incompetent and has a legal guardian) Alcohol / Substance use:  Not Applicable Psych involvement (Current and /or in the community):  No (Comment)  Discharge Needs  Concerns to be addressed:  Care Coordination Readmission within the last 30 days:  No Current discharge risk:  Physical Impairment, Lack of  support system, Lives alone Barriers to Discharge:  Continued Medical Work up   [employer], LCSW 08/05/2018, 1:49 PM

## 2018-08-05 NOTE — Progress Notes (Signed)
Initial Nutrition Assessment  DOCUMENTATION CODES:   Severe malnutrition in context of chronic illness, Underweight  INTERVENTION:  Current NPO for procedure, monitor for liberalization Ensure Enlive TID: to provide 350kal, 20g protein per serving (curretnly ordered BID) Magic Cup BID: to provide 290kcal, 9g protein per serving MVI   NUTRITION DIAGNOSIS:   Severe Malnutrition related to chronic illness(CKDIII) as evidenced by severe muscle depletion, severe fat depletion.   GOAL:   Patient will meet greater than or equal to 90% of their needs   MONITOR:   Diet advancement, PO intake, Supplement acceptance, Weight trends  REASON FOR ASSESSMENT:   Malnutrition Screening Tool(low BMI)    ASSESSMENT:    84yo patient found on the floor in his home by a neighbor and admitted 10/8 w/ dehydration. Pt lives alone and plans for SNF placement at d/c. PMH: CKDIII, CVA, Total knee arthroplasty, anal fistulotomy  Pt enjoying EE at visit and requested another to drink. Pt endorses good appetite, stating "he could eat the [place] end out of a northbound bull". Pt reports 100% of breakfast and inquired about ordering salmon and side salad for lunch. Pt was unsure of eating habits at home, and reported he ate what was in the cabinets or made eggs.   Medications: EE, Melatonin, Protonix Labs: BUN 35 (L), Cr 1.54 (L), GFR 43 (L)  NUTRITION - FOCUSED PHYSICAL EXAM:    Most Recent Value  Orbital Region  Severe depletion  Upper Arm Region  Severe depletion  Thoracic and Lumbar Region  Severe depletion  Buccal Region  Severe depletion  Temple Region  Severe depletion  Clavicle Bone Region  Severe depletion  Clavicle and Acromion Bone Region  Moderate depletion  Scapular Bone Region  Moderate depletion  Dorsal Hand  Moderate depletion  Patellar Region  Moderate depletion  Anterior Thigh Region  Moderate depletion  Posterior Calf Region  Moderate depletion  Edema (RD Assessment)  None   Hair  Reviewed  Eyes  Reviewed  Mouth  Reviewed  Skin  Reviewed  Nails  Reviewed       Diet Order:   Diet Order            Diet NPO time specified  Diet effective now              EDUCATION NEEDS:   No education needs have been identified at this time  Skin:  Skin Assessment: Reviewed RN Assessment(dry, flaky; cracking to BL heels)  Last BM:  10/8  Height:   Ht Readings from Last 1 Encounters:  08/04/18 5\' 8"  (1.727 m)    Weight:   Wt Readings from Last 1 Encounters:  08/04/18 47.8 kg    Ideal Body Weight:  70 kg  BMI:  Body mass index is 16.03 kg/m.  Estimated Nutritional Needs:   Kcal:  1430-1520  Protein:  62-72g  Fluid:  1.2L    Lajuan Lines, RD, LDN  After Hours/Weekend Pager: [phone]

## 2018-08-05 NOTE — Care Management Obs Status (Signed)
Jaysun Mason NOTIFICATION   Patient Details  Name: GIBRAN VESELKA MRN: [id number] Date of Birth: 05-Apr-1922   Medicare Observation Status Notification Given:  Yes    MahabirJuliann Pulse, RN 08/05/2018, 3:13 PM

## 2018-08-05 NOTE — Evaluation (Signed)
Physical Therapy Evaluation Patient Details Name: [name] MRN: [id number] DOB: 1922-01-18 Today's Date: 08/05/2018   History of Present Illness  84 yo male admitted with dehydration. Hx of CVA, L TKA, R TKA, A fib, CKD.   Clinical Impression  On eval, pt was Min guard-Min assist for mobility. He walked ~100 feet without an assistive device. He is unsteady and at some risk for falls when mobilizing. He moves suprisingly well for his age. Discussed d/c plan-pt does not feel he needs to go to rehab. He is agreeable to HHPT f/u. Spoke with CSW as well. Feel pt would possibly be a good candidate for an ALF if that could be arranged. If pt would agree to ST rehab at SNF while awaiting ALF placement, that would be ideal. Should pt be able to d/c to an ALF from the hospital, recommendation would be for HHPT f/u and RW use for safe ambulation. Will continue to follow.     Follow Up Recommendations SNF;Supervision/Assistance - 24 hour(if pt will agree. He was agreeable to HHPT. )    Equipment Recommendations  Rolling walker with 5" wheels(if pt will agree to use one)    Recommendations for Other Services       Precautions / Restrictions Precautions Precautions: Fall Restrictions Weight Bearing Restrictions: No      Mobility  Bed Mobility Overal bed mobility: Modified Independent                Transfers Overall transfer level: Needs assistance   Transfers: Sit to/from Stand Sit to Stand: Min guard         General transfer comment: Unsteady. Close guard for safety. Cues for safety  Ambulation/Gait Ambulation/Gait assistance: Min assist Gait Distance (Feet): 100 Feet Assistive device: None Gait Pattern/deviations: Trunk flexed;Drifts right/left;Staggering right;Staggering left     General Gait Details: Intermittent assistance to steady. Uncontrolled pacing at times. Cues for safety. Pt tolerated distance well.   Stairs            Wheelchair Mobility     Modified Rankin (Stroke Patients Only)       Balance Overall balance assessment: Needs assistance           Standing balance-Leahy Scale: Good                               Pertinent Vitals/Pain Pain Assessment: No/denies pain    Home Living Family/patient expects to be discharged to:: Unsure Living Arrangements: Alone Available Help at Discharge: Neighbor;Available PRN/intermittently Type of Home: House Home Access: Stairs to enter Entrance Stairs-Rails: None Entrance Stairs-Number of Steps: 4 Home Layout: Multi-level Home Equipment: Shower seat      Prior Function Level of Independence: Independent with assistive device(s)               Hand Dominance        Extremity/Trunk Assessment        Lower Extremity Assessment Lower Extremity Assessment: Generalized weakness    Cervical / Trunk Assessment Cervical / Trunk Assessment: Kyphotic  Communication   Communication: HOH  Cognition Arousal/Alertness: Awake/alert Behavior During Therapy: WFL for tasks assessed/performed Overall Cognitive Status: Within Functional Limits for tasks assessed                                        General Comments  Exercises     Assessment/Plan    PT Assessment Patient needs continued PT services  PT Problem List Decreased strength;Decreased balance;Decreased mobility;Decreased activity tolerance;Decreased knowledge of use of DME       PT Treatment Interventions DME instruction;Gait training;Functional mobility training;Therapeutic activities;Balance training;Patient/family education;Therapeutic exercise    PT Goals (Current goals can be found in the Care Plan section)  Acute Rehab PT Goals Patient Stated Goal: home PT Goal Formulation: With patient Time For Goal Achievement: 08/19/18 Potential to Achieve Goals: Good    Frequency Min 3X/week   Barriers to discharge        Co-evaluation                AM-PAC PT "6 Clicks" Daily Activity  Outcome Measure Difficulty turning over in bed (including adjusting bedclothes, sheets and blankets)?: None Difficulty moving from lying on back to sitting on the side of the bed? : None Difficulty sitting down on and standing up from a chair with arms (e.g., wheelchair, bedside commode, etc,.)?: A Little Help needed moving to and from a bed to chair (including a wheelchair)?: A Little Help needed walking in hospital room?: A Little Help needed climbing 3-5 steps with a railing? : A Little 6 Click Score: 20    End of Session Equipment Utilized During Treatment: Gait belt Activity Tolerance: Patient tolerated treatment well Patient left: in bed;with call bell/phone within reach;with bed alarm set   PT Visit Diagnosis: Muscle weakness (generalized) (M62.81);Difficulty in walking, not elsewhere classified (R26.2)    Time: 5997-7414 PT Time Calculation (min) (ACUTE ONLY): 16 min   Charges:   PT Evaluation $PT Eval Moderate Complexity: [place], PT Acute Rehabilitation Services Pager: [phone] Office: [phone]

## 2018-08-06 DIAGNOSIS — L8990 Pressure ulcer of unspecified site, unspecified stage: Secondary | ICD-10-CM

## 2018-08-06 DIAGNOSIS — E43 Unspecified severe protein-calorie malnutrition: Secondary | ICD-10-CM

## 2018-08-06 LAB — BASIC METABOLIC PANEL
Anion gap: 7 (ref 5–15)
BUN: 30 mg/dL — ABNORMAL HIGH (ref 8–23)
CO2: 22 mmol/L (ref 22–32)
Calcium: 8.4 mg/dL — ABNORMAL LOW (ref 8.9–10.3)
Chloride: 109 mmol/L (ref 98–111)
Creatinine, Ser: 1.26 mg/dL — ABNORMAL HIGH (ref 0.61–1.24)
GFR calc Af Amer: 54 mL/min — ABNORMAL LOW (ref >60)
GFR calc non Af Amer: 47 mL/min — ABNORMAL LOW (ref >60)
Glucose, Bld: 84 mg/dL (ref 70–99)
Potassium: 4 mmol/L (ref 3.5–5.1)
Sodium: 138 mmol/L (ref 135–145)

## 2018-08-06 LAB — URINE CULTURE

## 2018-08-06 MED ORDER — SODIUM CHLORIDE 0.9 % IV SOLN
INTRAVENOUS | Status: AC
  Administered 2018-08-06: 23:00:00 via INTRAVENOUS

## 2018-08-06 MED ORDER — ENSURE ENLIVE PO LIQD
237.0000 mL | Freq: Three times a day (TID) | ORAL | Status: DC
  Administered 2018-08-06 – 2018-08-10 (×10): 237 mL via ORAL

## 2018-08-06 MED ORDER — APIXABAN 2.5 MG PO TABS
2.5000 mg | ORAL_TABLET | Freq: Two times a day (BID) | ORAL | Status: DC
  Administered 2018-08-06 – 2018-08-10 (×9): 2.5 mg via ORAL
  Filled 2018-08-06 (×9): qty 1

## 2018-08-06 NOTE — Evaluation (Signed)
Clinical/Bedside Swallow Evaluation Patient Details  Name: [name] MRN: [id number] Date of Birth: 04/26/1922  Today's Date: 08/06/2018 Time: SLP Start Time (ACUTE ONLY): 0915 SLP Stop Time (ACUTE ONLY): 1004 SLP Time Calculation (min) (ACUTE ONLY): 49 min  Past Medical History:  Past Medical History:  Diagnosis Date  . Atrial fibrillation ([place])   . CAP (community acquired pneumonia) 02/16/2018  . Chronic a-fib    [name] 09/20/2017  . CKD (chronic kidney disease), stage III ([name])    [name] 02/17/2018  . History of hiatal hernia    [name] 09/20/2017  . Hyperlipidemia   . Hypertension   . Stroke ([name]) 04/20/2008   [name] 01/23/2010  . Stroke [name]) 11/2013   Large posterior and inferior right MCA stroke/infarct/notes 12/22/2013   Past Surgical History:  Past Surgical History:  Procedure Laterality Date  . ANAL FISTULOTOMY  10/2006   [name] 03/13/2011  . JOINT REPLACEMENT    . ROTATOR CUFF REPAIR Left 08/1995   [name] 02/27/2011  . TOTAL KNEE ARTHROPLASTY Right 03/2007   [name] 02/27/2011  . TOTAL KNEE ARTHROPLASTY Left 03/2009   [name] 02/27/2011   HPI:  pt is a 84 yo male adm to [facility] due to neighbors being concerned pt could not care for himself.  PMH + for dementia per pt, cva, right MCA CVA, malnutrition, CKD, HTN.  Pt also with h/o dysphagia after right MCA = 11/2013 - with recommendations for dys3/nectar with chin tuck.  RN noted pt was coughing yesterday with meal and swallow evaluation was ordered.  Pt cxr negative during repeated admissions = pt has h/o hiatal hernia with part of stomach in thoracic region - noted in 2008.     Assessment / Plan / Recommendation Clinical Impression  Pt with overall functional swallow given his 84 year of age.  No focal CN deficits apparent and pt's voice/cough are relatively strong.  Pt able to self feed which aids his airway protection.  Excessive mastication of foods noted with anterior oral position of boluses.  Cough x1 noted  throughout entire breakfast - after swallowing solid.  Voice noted to be gurgly.  Pt tends to talk with boluses retained in oral cavity.  Pt observed to eat slowly and carefully- despite his hunger.    Given his repeated CXRs are negative for acute finding and known hiatal hernia noted - tolerance of po diet apparent.   Suspect prior dysphagia was acute due to his right MCA CVA and has largely resolved.    Recommend continue diet with aspiration precautions.  Will follow up x1 given pt with reported difficulties yesterday.  Educated him to findings/recommendations and prior test result.   SLP Visit Diagnosis: Dysphagia, unspecified (R13.10)    Aspiration Risk  Mild aspiration risk    Diet Recommendation Regular;Thin liquid   Liquid Administration via: Cup;Straw Medication Administration: Other (Comment)(as tolerated, with puree if problematic) Supervision: Patient able to self feed Compensations: Slow rate;Small sips/bites Postural Changes: Remain upright for at least 30 minutes after po intake;Seated upright at 90 degrees    Other  Recommendations Oral Care Recommendations: Oral care BID   Follow up Recommendations None      Frequency and Duration min 1 x/week  1 week       Prognosis Prognosis for Safe Diet Advancement: Good Barriers to Reach Goals: Cognitive deficits      Swallow Study   General Date of Onset: 08/06/18 HPI: pt is a 84 yo male adm to [facility] due to neighbors being concerned  pt could not care for himself.  PMH + for dementia per pt, cva, right MCA CVA, malnutrition, CKD, HTN.  Pt also with h/o dysphagia after right MCA = 11/2013 - with recommendations for dys3/nectar with chin tuck.  RN noted pt was coughing yesterday with meal and swallow evaluation was ordered.  Pt cxr negative during repeated admissions = pt has h/o hiatal hernia with part of stomach in thoracic region - noted in 2008.   Type of Study: Bedside Swallow Evaluation Previous Swallow Assessment: mbs  2015 after stroke, aspiration of thin, nectar with chin tuck recommended Diet Prior to this Study: Regular;Thin liquids Temperature Spikes Noted: No Respiratory Status: Room air History of Recent Intubation: No Behavior/Cognition: Alert;Cooperative;Other (Comment)(very HOH) Oral Cavity - Dentition: Adequate natural dentition Vision: Functional for self-feeding Self-Feeding Abilities: Able to feed self Patient Positioning: Upright in bed Baseline Vocal Quality: Normal Volitional Cough: Strong Volitional Swallow: Unable to elicit    Oral/Motor/Sensory Function Overall Oral Motor/Sensory Function: Within functional limits   Ice Chips Ice chips: Not tested   Thin Liquid Thin Liquid: Within functional limits Presentation: Cup;Self Fed    Nectar Thick Nectar Thick Liquid: Not tested   Honey Thick Honey Thick Liquid: Not tested   Puree Puree: Not tested Other Comments: pt declined to swallow applesauce   Solid     Solid: Impaired Oral Phase Functional Implications: Impaired mastication;Prolonged oral transit Other Comments: prolonged mastication with anterior position of boluses, pt reports he "chews food very well before swallowing", cough x1 with solids only       [name] 08/06/2018,10:06 AM   Luanna Salk, MS [place] Pager [phone] Office [phone]

## 2018-08-06 NOTE — Progress Notes (Signed)
NUTRITION NOTE  Consult received this AM for assessment of nutrition requirements and status. Patient was seen for initial assessment by another RD yesterday and was identified to meet criteria for severe malnutrition in the context of chronic illness.  Patient was NPO yesterday for a procedure and is now on a Regular diet. Will place supplement orders per plan outlined in RD's note yesterday: Ensure Enlive TID and Magic Cup BID.  RD will continue to follow per protocol.   Jarome Matin, MS, RD, LDN, [facility] Inpatient Clinical Dietitian Pager # [phone] After hours/weekend pager # [phone]

## 2018-08-06 NOTE — Progress Notes (Signed)
[facility] PROGRESS NOTE    Progress Note  KREG EARHART  CWC:[id number] DOB: 08-18-1922 DOA: 08/04/2018 PCP: [facility]     Brief Narrative:   WESAM GEARHART is an 84 y.o. male past medical history of chronic kidney disease stage III, chronic atrial fibrillation not on anticoagulation, history of CVA presents to the emergency room as patient was unable to care for himself.  In the ED he was found afebrile saturating well with possibly atrial fibrillation, his heart rate improved in the ED after IV fluid.  Assessment/Plan:   Dehydration Denies any vomiting diarrhea or poor appetite. He seems hypovolemic due to rise in BUN and creatinine is slowly improving with IV fluid hydration. Physical therapy evaluated the patient the recommended skilled nursing facility. Awaiting skilled nursing facility placement.  AKI on chronic kidney disease stage III: Likely due to prerenal etiology improving with IV fluid hydration. Baseline creatinine around 1.3.    Atrial fibrillation, chronic CHADS-VASc is 26 (age x2, CVA x2)  Restart Eliquis.  Capacity: The patient just not seem to have any insight on his medical condition and his outcomes.  And what his choices would lead to.  He is also not able to communicate the choices that I have explained to him.   -He could not tell me on his own words his medical conditions, he relates that his medication and the care or assistance healthcare and Meals on Wheels can provide him will not help him. Speaking to the daughters he has been hallucinating at home regularly, he is called his daughter sheryl crying and screaming that his other daughter has died in a motor vehicle accident, when [name] ask how he knows he has told her that God has come down and giving him the gift of seeing the future. Speaking to Romie Minus his all the daughte who is the healthcare power of attorney, she has been trying to get him to a facility, but has been  unsuccessful.  Every time she tries to get Meals on Wheels hold home health aide at home he refuses to let him in.  She is also complained that the person who checks on him at home his phone call cases of animals at his house. Patient obviously lacks capacity to make medical decision.  Severe protein caloric malnutrition: Start IV fluids Ensure 3 times daily consult nutrition.   DVT prophylaxis: lovenox Family Communication: Daughter [phone] Disposition Plan/Barrier to D/C: snf Code Status:     Code Status Orders  (From admission, onward)         Start     Ordered   08/04/18 2051  Do not attempt resuscitation (DNR)  Continuous    Question Answer Comment  In the event of cardiac or respiratory ARREST Do not call a "code blue"   In the event of cardiac or respiratory ARREST Do not perform Intubation, CPR, defibrillation or ACLS   In the event of cardiac or respiratory ARREST Use medication by any route, position, wound care, and other measures to relive pain and suffering. May use oxygen, suction and manual treatment of airway obstruction as needed for comfort.      08/04/18 2052        Code Status History    Date Active Date Inactive Code Status Order ID Comments User Context   04/09/2018 2138 04/10/2018 1924 Full Code [id number]  Valarie Merino, MD ED   02/17/2018 0058 02/18/2018 1918 DNR [id number]  Vianne Bulls, MD ED   09/20/2017  0127 09/20/2017 1802 DNR [id number]  Gwynne Edinger, MD Inpatient   09/17/2017 [id number] 09/17/2017 2227 Full Code [id number]  Reubin Milan, MD Inpatient   12/02/2013 1714 12/10/2013 1433 Full Code [id number]  Elizabeth Sauer Inpatient   11/29/2013 1835 12/02/2013 1714 Full Code [id number]  Adrian Prows, MD ED        IV Access:    Peripheral IV   Procedures and diagnostic studies:   Dg Chest 2 View  Result Date: 08/05/2018 CLINICAL DATA:  Aspiration pneumonia EXAM: CHEST - 2 VIEW COMPARISON:  June 16, 2018 FINDINGS: The heart  size and mediastinal contours are within normal limits. The lungs are hyperinflated. Both lungs are clear. The visualized skeletal structures are stable. IMPRESSION: No active cardiopulmonary disease.  Hyperinflated lungs. Electronically Signed   By: Abelardo Diesel M.D.   On: 08/05/2018 19:57     Medical Consultants:    None.  Anti-Infectives:  None  Subjective:    JAMAS JAQUAY has no complaints.  Objective:    Vitals:   08/05/18 1653 08/05/18 1719 08/05/18 2111 08/06/18 0444  BP: 92/68 (!) 100/58 103/70 100/63  Pulse: 75  (!) 118 74  Resp: 14  18   Temp: 97.7 F (36.5 C)  98.2 F (36.8 C) (!) 97.5 F (36.4 C)  TempSrc: Oral  Oral Oral  SpO2: 92%  96% 99%  Weight:      Height:        Intake/Output Summary (Last 24 hours) at 08/06/2018 0830 Last data filed at 08/06/2018 0600 Gross per 24 hour  Intake 1263.85 ml  Output 151 ml  Net 1112.85 ml   Filed Weights   08/04/18 2141  Weight: 47.8 kg    Exam: General exam: In no acute distress, cachectic Respiratory system: Good air movement and clear to auscultation. Cardiovascular system: S1 & S2 heard, RRR.  Gastrointestinal system: Abdomen is nondistended, soft and nontender.  Central nervous system: Alert and oriented. No focal neurological deficits. Extremities: No pedal edema. Skin: No rashes, lesions or ulcers Psychiatry: Judgement and insight appear normal. Mood & affect appropriate.    Data Reviewed:    Labs: Basic Metabolic Panel: Recent Labs  Lab 08/04/18 1608 08/05/18 0522 08/06/18 0546  NA 141 140 138  K 4.8 4.3 4.0  CL 104 106 109  CO2 26 23 22   GLUCOSE 93 77 84  BUN 43* 35* 30*  CREATININE 1.74* 1.54* 1.26*  CALCIUM 9.9 8.9 8.4*   GFR Estimated Creatinine Clearance: 24.2 mL/min (A) (by C-G formula based on SCr of 1.26 mg/dL (H)). Liver Function Tests: No results for input(s): AST, ALT, ALKPHOS, BILITOT, PROT, ALBUMIN in the last 168 hours. No results for input(s): LIPASE, AMYLASE  in the last 168 hours. No results for input(s): AMMONIA in the last 168 hours. Coagulation profile No results for input(s): INR, PROTIME in the last 168 hours.  CBC: Recent Labs  Lab 08/04/18 1608  WBC 6.9  NEUTROABS 5.3  HGB 13.8  HCT 42.3  MCV 95.7  PLT 192   Cardiac Enzymes: No results for input(s): CKTOTAL, CKMB, CKMBINDEX, TROPONINI in the last 168 hours. BNP (last 3 results) No results for input(s): PROBNP in the last 8760 hours. CBG: Recent Labs  Lab 08/04/18 1428  GLUCAP 84   D-Dimer: No results for input(s): DDIMER in the last 72 hours. Hgb A1c: No results for input(s): HGBA1C in the last 72 hours. Lipid Profile: No results for input(s): CHOL, HDL, LDLCALC, TRIG, CHOLHDL, LDLDIRECT in  the last 72 hours. Thyroid function studies: No results for input(s): TSH, T4TOTAL, T3FREE, THYROIDAB in the last 72 hours.  Invalid input(s): FREET3 Anemia work up: No results for input(s): VITAMINB12, FOLATE, FERRITIN, TIBC, IRON, RETICCTPCT in the last 72 hours. Sepsis Labs: Recent Labs  Lab 08/04/18 1608  WBC 6.9   Microbiology Recent Results (from the past 240 hour(s))  Urine Culture     Status: Abnormal   Collection Time: 08/04/18  7:19 PM  Result Value Ref Range Status   Specimen Description   Final    URINE, CLEAN CATCH Performed at [facility], [place] [address] [place], [place] [address]    Special Requests   Final    NONE Performed at [facility], [place] [address] [place], [place] [address]    Culture MULTIPLE SPECIES PRESENT, SUGGEST RECOLLECTION (A)  Final   Report Status 08/06/2018 FINAL  Final     Medications:   . feeding supplement (ENSURE ENLIVE)  237 mL Oral BID BM  . heparin  5,000 Units Subcutaneous Q8H  . Melatonin  3 mg Oral Q24H  . pantoprazole  40 mg Oral BID  . sodium chloride flush  3 mL Intravenous Q12H   Continuous Infusions: . sodium chloride 75 mL/hr at 08/06/18 0600     LOS: 1 day    Charlynne Cousins  [facility] Pager [phone]  *Please refer to [email or web address], password [email or web address] to get updated schedule on who will round on this patient, as hospitalists switch teams weekly. If 7PM-7AM, please contact night-coverage at [email or web address], password [email or web address] for any overnight needs.  08/06/2018, 8:30 AM

## 2018-08-06 NOTE — NC FL2 (Signed)
[address] LEVEL OF CARE SCREENING TOOL     IDENTIFICATION  Patient Name: [name] Birthdate: May 31, 1922 Sex: male Admission Date (Current Location): 08/04/2018  [facility] and [place] Number:  Herbalist and Address:  [facility],  [place] [place], [place]      Provider Number: [id number]  Attending Physician Name and Address:  Charlynne Cousins, MD  Relative Name and Phone Number:  Gae Bon [phone] (legal guardian/daughter)    Current Level of Care: Hospital Recommended Level of Care: [place] Prior Approval Number:    Date Approved/Denied:   PASRR Number:   [id number]   Discharge Plan: SNF    Current Diagnoses: Patient Active Problem List   Diagnosis Date Noted  . Protein-calorie malnutrition, severe 08/06/2018  . Pressure injury of skin 08/06/2018  . Dehydration 08/04/2018  . Involuntary commitment   . CKD (chronic kidney disease), stage III ([place]) 02/17/2018  . Rib pain on right side 09/20/2017  . Chest pain, pleuritic 09/20/2017  . Abdominal pain 09/17/2017  . Hyperlipidemia 09/17/2017  . Hypertension 09/17/2017  . Anemia 09/17/2017  . Atrial fibrillation, chronic 11/29/2013  . History of completed stroke 11/29/2013  . OTHER VITAMIN B12 DEFICIENCY ANEMIA 01/08/2008  . ANAL FISTULA 01/08/2008    Orientation RESPIRATION BLADDER Height & Weight     Self, Time, Situation, Place  Normal Continent Weight: 105 lb 6.4 oz (47.8 kg) Height:  5\' 8"  (172.7 cm)  BEHAVIORAL SYMPTOMS/MOOD NEUROLOGICAL BOWEL NUTRITION STATUS        Diet(regular)  AMBULATORY STATUS COMMUNICATION OF NEEDS Skin   Limited Assist Verbally PU Stage and Appropriate Care PU Stage 1 Dressing: No Dressing(Location: Coccyx Location Orientation: Medial;Right;Left )                     Personal Care Assistance Level of Assistance  Bathing, Feeding, Dressing Bathing Assistance: Maximum assistance Feeding assistance:  Independent Dressing Assistance: Maximum assistance     Functional Limitations Info  Sight, Hearing, Speech Sight Info: Impaired Hearing Info: Impaired Speech Info: Adequate    SPECIAL CARE FACTORS FREQUENCY  PT (By licensed PT), OT (By licensed OT), Speech therapy     PT Frequency: 5x/week OT Frequency: 5x/week     Speech Therapy Frequency: 1x/wwek      Contractures Contractures Info: Not present    Additional Factors Info  Code Status, Allergies Code Status Info: DNR Allergies Info: Codeine           Current Medications (08/06/2018):  This is the current hospital active medication list Current Facility-Administered Medications  Medication Dose Route Frequency Provider Last Rate Last Dose  . 0.9 %  sodium chloride infusion   Intravenous Continuous Charlynne Cousins, MD 75 mL/hr at 08/06/18 0600    . 0.9 %  sodium chloride infusion   Intravenous Continuous Charlynne Cousins, MD 75 mL/hr at 08/06/18 1049    . acetaminophen (TYLENOL) tablet 650 mg  650 mg Oral Q6H PRN Opyd, Ilene Qua, MD       Or  . acetaminophen (TYLENOL) suppository 650 mg  650 mg Rectal Q6H PRN Opyd, Ilene Qua, MD      . apixaban (ELIQUIS) tablet 2.5 mg  2.5 mg Oral BID Charlynne Cousins, MD      . bisacodyl (DULCOLAX) EC tablet 5 mg  5 mg Oral Daily PRN Opyd, Ilene Qua, MD      . feeding supplement (ENSURE ENLIVE) (ENSURE ENLIVE) liquid 237  mL  237 mL Oral TID BM Charlynne Cousins, MD   237 mL at 08/06/18 1043  . HYDROcodone-acetaminophen (NORCO/VICODIN) 5-325 MG per tablet 1-2 tablet  1-2 tablet Oral Q4H PRN Opyd, Ilene Qua, MD      . Melatonin TABS 3 mg  3 mg Oral Q24H Charlynne Cousins, MD   3 mg at 08/05/18 2058  . metoprolol tartrate (LOPRESSOR) injection 2.5 mg  2.5 mg Intravenous Q2H PRN Opyd, Ilene Qua, MD      . ondansetron (ZOFRAN) tablet 4 mg  4 mg Oral Q6H PRN Opyd, Ilene Qua, MD       Or  . ondansetron (ZOFRAN) injection 4 mg  4 mg Intravenous Q6H PRN Opyd, Ilene Qua, MD    4 mg at 08/05/18 1454  . pantoprazole (PROTONIX) EC tablet 40 mg  40 mg Oral BID Charlynne Cousins, MD   40 mg at 08/06/18 1043  . senna-docusate (Senokot-S) tablet 1 tablet  1 tablet Oral QHS PRN Opyd, Ilene Qua, MD      . sodium chloride flush (NS) 0.9 % injection 3 mL  3 mL Intravenous Q12H Opyd, Ilene Qua, MD   3 mL at 08/06/18 1043     Discharge Medications: Please see discharge summary for a list of discharge medications.  Relevant Imaging Results:  Relevant Lab Results:   Additional Information SSN [id number]  Burnis Medin, LCSW

## 2018-08-06 NOTE — Progress Notes (Signed)
CSW following to assist with discharge planning and placement for patient.  Per chart review, patient switched to inpatient status on 08/05/18. CSW updated patient's daughter/legal guardian Gae Bon). Patient's daughter requested that CSW follow up with [facility] SNF to start the process to have patient placed at [facility].   CSW contacted [facility] Verdell Carmine [phone]) to inquire about SNF referral process, no answer. CSW left voicemail requesting return phone call.   CSW will continue to follow and assist with discharge planning.  Abundio Miu, [place] Social Worker [facility] Cell#: [phone]

## 2018-08-06 NOTE — Progress Notes (Signed)
CSW followed up with [facility] staff member Verdell Carmine. Staff agreed to review patient's referral.  CSW sent patient's referral. CSW awaiting return call from staff.  CSW will continue to follow and assist with discharge planning.   Abundio Miu, [place] Social Worker [facility] Cell#: [phone]

## 2018-08-07 DIAGNOSIS — L89152 Pressure ulcer of sacral region, stage 2: Secondary | ICD-10-CM

## 2018-08-07 LAB — BASIC METABOLIC PANEL
Anion gap: 7 (ref 5–15)
BUN: 29 mg/dL — ABNORMAL HIGH (ref 8–23)
CO2: 23 mmol/L (ref 22–32)
Calcium: 8.1 mg/dL — ABNORMAL LOW (ref 8.9–10.3)
Chloride: 107 mmol/L (ref 98–111)
Creatinine, Ser: 1.2 mg/dL (ref 0.61–1.24)
GFR calc Af Amer: 58 mL/min — ABNORMAL LOW (ref >60)
GFR calc non Af Amer: 50 mL/min — ABNORMAL LOW (ref >60)
Glucose, Bld: 96 mg/dL (ref 70–99)
Potassium: 4.2 mmol/L (ref 3.5–5.1)
Sodium: 137 mmol/L (ref 135–145)

## 2018-08-07 NOTE — Discharge Summary (Addendum)
Physician Discharge Summary  [name] ESP:[id number] DOB: [birth year] DOA: 08/04/2018  [facility]  Admit date: 08/04/2018 Discharge date: 08/07/2018  Admitted From: Home Disposition:  SNF  Recommendations for Outpatient Follow-up:  1. Follow up with PCP in 1-2 weeks 2. Please obtain BMP/CBC in one week   Home Health:No Equipment/Devices:None  Discharge Condition:Guarded CODE STATUS:DNR Diet recommendation: Heart Healthy   Brief/Interim Summary: 84 y.o. male past medical history of chronic kidney disease stage III, chronic atrial fibrillation not on anticoagulation, history of CVA presents to the emergency room as patient was unable to care for himself.  In the ED he was found afebrile saturating well with possibly atrial fibrillation, his heart rate improved in the ED after IV fluid.  Discharge Diagnoses:  Principal Problem:   Dehydration Active Problems:   Atrial fibrillation, chronic   History of completed stroke   Hypertension   CKD (chronic kidney disease), stage III (HCC)   Protein-calorie malnutrition, severe   Pressure injury of skin   Sacral decubitus ulcer, stage II (HCC)  Dehydration: Patient denies any vomiting diarrhea or nausea with poor appetite not able to take care of himself he was hypokalemic on admission with a mild rise in BUN and creatinine which improved with IV fluid hydration. Physical therapy evaluated the patient the recommended skilled nursing facility.  Acute on chronic kidney disease stage III: Likely prerenal in etiology resolved with IV fluid hydration.  Chronic atrial fibrillation: Continue Eliquis, CHADS-VASc is 4   Capacity: The patient does not seem to have any insight on his medical condition and his outcomes, have explained to him the choices and consequences of his choices he is not able to explain them back to me. He cannot tell me in his own words his medical conditions that he has or anything related to  them. As per daughter as he does not allow healthcare assistance physical therapy or home health to go at home or inside his home to help him. Speaking also with his daughter they have been complaining of hallucinations at home regularly he is called 1 of his daughters telling him that the other one has been dead. I spoke with a healthcare power of attorney and she agreed that he needs to be at the facility. The patient obviously lacks capacity and cannot care for himself at (have found coccus of dead animals in his house.  Severe protein caloric malnutrition: Continue Ensure 3 times daily as an outpatient.   Discharge Instructions  Discharge Instructions    Diet - low sodium heart healthy   Complete by:  As directed    Increase activity slowly   Complete by:  As directed      Allergies as of 08/07/2018      Reactions   Codeine Nausea And Vomiting      Medication List    STOP taking these medications   pantoprazole 40 MG tablet Commonly known as:  PROTONIX     TAKE these medications   apixaban 2.5 MG Tabs tablet Commonly known as:  ELIQUIS Take 1 tablet (2.5 mg total) by mouth 2 (two) times daily.   pravastatin 20 MG tablet Commonly known as:  PRAVACHOL Take 1 tablet (20 mg total) by mouth at bedtime.       Allergies  Allergen Reactions  . Codeine Nausea And Vomiting    Consultations:  None   Procedures/Studies: Dg Chest 2 View  Result Date: 08/05/2018 CLINICAL DATA:  Aspiration pneumonia EXAM: CHEST - 2 VIEW  COMPARISON:  June 16, 2018 FINDINGS: The heart size and mediastinal contours are within normal limits. The lungs are hyperinflated. Both lungs are clear. The visualized skeletal structures are stable. IMPRESSION: No active cardiopulmonary disease.  Hyperinflated lungs. Electronically Signed   By: Abelardo Diesel M.D.   On: 08/05/2018 19:57    Subjective: No new complaints.  Discharge Exam: Vitals:   08/06/18 2155 08/07/18 0415  BP: 95/62 105/72   Pulse: 71 81  Resp:  16  Temp:  97.9 F (36.6 C)  SpO2:  98%   Vitals:   08/06/18 2143 08/06/18 2155 08/07/18 0415 08/07/18 0630  BP: (!) 80/53 95/62 105/72   Pulse: 90 71 81   Resp: 16  16   Temp: 98.8 F (37.1 C)  97.9 F (36.6 C)   TempSrc: Oral  Oral   SpO2: 100%  98%   Weight:    52.3 kg  Height:        General: Pt is alert, awake, not in acute distress Cardiovascular: RRR, S1/S2 +, no rubs, no gallops Respiratory: CTA bilaterally, no wheezing, no rhonchi Abdominal: Soft, NT, ND, bowel sounds + Extremities: no edema, no cyanosis    The results of significant diagnostics from this hospitalization (including imaging, microbiology, ancillary and laboratory) are listed below for reference.     Microbiology: Recent Results (from the past 240 hour(s))  Urine Culture     Status: Abnormal   Collection Time: 08/04/18  7:19 PM  Result Value Ref Range Status   Specimen Description   Final    URINE, CLEAN CATCH Performed at [facility], [place] [address], [place], [place] [address]    Special Requests   Final    NONE Performed at [facility], [place] [address], [place], [place] [address]    Culture MULTIPLE SPECIES PRESENT, SUGGEST RECOLLECTION (A)  Final   Report Status 08/06/2018 FINAL  Final     Labs: BNP (last 3 results) No results for input(s): BNP in the last 8760 hours. Basic Metabolic Panel: Recent Labs  Lab 08/04/18 1608 08/05/18 0522 08/06/18 0546 08/07/18 0539  NA 141 140 138 137  K 4.8 4.3 4.0 4.2  CL 104 106 109 107  CO2 26 23 22 23   GLUCOSE 93 77 84 96  BUN 43* 35* 30* 29*  CREATININE 1.74* 1.54* 1.26* 1.20  CALCIUM 9.9 8.9 8.4* 8.1*   Liver Function Tests: No results for input(s): AST, ALT, ALKPHOS, BILITOT, PROT, ALBUMIN in the last 168 hours. No results for input(s): LIPASE, AMYLASE in the last 168 hours. No results for input(s): AMMONIA in the last 168 hours. CBC: Recent Labs  Lab 08/04/18 1608   WBC 6.9  NEUTROABS 5.3  HGB 13.8  HCT 42.3  MCV 95.7  PLT 192   Cardiac Enzymes: No results for input(s): CKTOTAL, CKMB, CKMBINDEX, TROPONINI in the last 168 hours. BNP: Invalid input(s): POCBNP CBG: Recent Labs  Lab 08/04/18 1428  GLUCAP 84   D-Dimer No results for input(s): DDIMER in the last 72 hours. Hgb A1c No results for input(s): HGBA1C in the last 72 hours. Lipid Profile No results for input(s): CHOL, HDL, LDLCALC, TRIG, CHOLHDL, LDLDIRECT in the last 72 hours. Thyroid function studies No results for input(s): TSH, T4TOTAL, T3FREE, THYROIDAB in the last 72 hours.  Invalid input(s): FREET3 Anemia work up No results for input(s): VITAMINB12, FOLATE, FERRITIN, TIBC, IRON, RETICCTPCT in the last 72 hours. Urinalysis    Component Value Date/Time   COLORURINE YELLOW  08/04/2018 1919   APPEARANCEUR CLEAR 08/04/2018 1919   LABSPEC 1.020 08/04/2018 1919   PHURINE 5.0 08/04/2018 1919   GLUCOSEU NEGATIVE 08/04/2018 1919   HGBUR NEGATIVE 08/04/2018 [place] NEGATIVE 08/04/2018 1919   KETONESUR 5 (A) 08/04/2018 1919   PROTEINUR NEGATIVE 08/04/2018 1919   UROBILINOGEN 0.2 11/29/2013 1619   NITRITE NEGATIVE 08/04/2018 1919   LEUKOCYTESUR TRACE (A) 08/04/2018 1919   Sepsis Labs Invalid input(s): PROCALCITONIN,  WBC,  LACTICIDVEN Microbiology Recent Results (from the past 240 hour(s))  Urine Culture     Status: Abnormal   Collection Time: 08/04/18  7:19 PM  Result Value Ref Range Status   Specimen Description   Final    URINE, CLEAN CATCH Performed at [facility], [place] [address], [place], [place] [address]    Special Requests   Final    NONE Performed at [facility], [place] [address], [place], [place] [address]    Culture MULTIPLE SPECIES PRESENT, SUGGEST RECOLLECTION (A)  Final   Report Status 08/06/2018 FINAL  Final     Time coordinating discharge: Over 30 minutes  SIGNED:   Charlynne Cousins, MD  [facility] 08/07/2018, 1:16 PM Pager   If 7PM-7AM, please contact night-coverage [email or web address] Password [email or web address]

## 2018-08-07 NOTE — Progress Notes (Signed)
  Speech Language Pathology Treatment: Dysphagia  Patient Details Name: [name] MRN: [id number] DOB: 11-06-1921 Today's Date: 08/07/2018 Time: 4739-5844 SLP Time Calculation (min) (ACUTE ONLY): 15 min  Assessment / Plan / Recommendation Clinical Impression  Pt today seen to assure po tolerance and to reinforce helpful compensation strategies.  Pt denies ever having problems swallowing - informed him of prior MBS after his right MCA CVA in 2015 that appears significantly improved.  Observed pt with intake of soda and graham crackers  - wet voice noted x2 with liquids that resolved with further intake.  Suspect pt may have some laryngeal penetration that is clearing.  When asked if he was hungry, he stated "I'm always hungry".   Pt continues with slowly eat which aids airway protection.  He does have some difficulty with adequately clearing mouth of solids before taking more but if tolerating this.  Educated him to recommendations with written instruction.  No SLP follow up needed as pt doing well managing his chronic low grade dysphagia.     HPI HPI: pt is a 84 yo male adm to [facility] due to neighbors being concerned pt could not care for himself.  PMH + for dementia per pt, cva, right MCA CVA, malnutrition, CKD, HTN.  Pt also with h/o dysphagia after right MCA = 11/2013 - with recommendations for dys3/nectar with chin tuck.  RN noted pt was coughing yesterday with meal and swallow evaluation was ordered.  Pt cxr negative during repeated admissions = pt has h/o hiatal hernia with part of stomach in thoracic region - noted in 2008.        SLP Plan  All goals met       Recommendations  Diet recommendations: Regular;Thin liquid Liquids provided via: Cup;Straw Medication Administration: (as tolerated) Compensations: Slow rate;Small sips/bites Postural Changes and/or Swallow Maneuvers: Seated upright 90 degrees;Upright 30-60 min after meal                Oral Care Recommendations: Oral  care BID Follow up Recommendations: None SLP Visit Diagnosis: Dysphagia, pharyngoesophageal phase (R13.14) Plan: All goals met       GO                [name] 08/07/2018, 12:06 PM   Luanna Salk, [place] [facility] SLP Acute Rehab Services Pager [phone] Office [phone]

## 2018-08-07 NOTE — Progress Notes (Signed)
CSW contacted by [facility] staff member Arbie Cookey [phone]) and informed that they will be able to accept patient on Monday 08/10/18 after [employer] form is provided.  CSW updated patient's daughter Gae Bon).  CSW will continue to follow and assist with discharge planning.   Abundio Miu, [place] Social Worker [facility] Cell#: [phone]

## 2018-08-08 LAB — BASIC METABOLIC PANEL
Anion gap: 6 (ref 5–15)
BUN: 22 mg/dL (ref 8–23)
CO2: 22 mmol/L (ref 22–32)
Calcium: 7.7 mg/dL — ABNORMAL LOW (ref 8.9–10.3)
Chloride: 108 mmol/L (ref 98–111)
Creatinine, Ser: 1.09 mg/dL (ref 0.61–1.24)
GFR calc Af Amer: >60 mL/min (ref >60)
GFR calc non Af Amer: 56 mL/min — ABNORMAL LOW (ref >60)
Glucose, Bld: 86 mg/dL (ref 70–99)
Potassium: 3.8 mmol/L (ref 3.5–5.1)
Sodium: 136 mmol/L (ref 135–145)

## 2018-08-08 NOTE — Progress Notes (Signed)
[facility] PROGRESS NOTE    Progress Note  [name]  RDE:[id number] DOB: 1922/07/12 DOA: 08/04/2018 [facility]: [facility]     Brief Narrative:   [name] is an 84 y.o. male past medical history of chronic kidney disease stage III, chronic atrial fibrillation not on anticoagulation, history of CVA presents to the emergency room as patient was unable to care for himself.  In the ED he was found afebrile saturating well with possibly atrial fibrillation, his heart rate improved in the ED after IV fluid.  Assessment/Plan:   Dehydration Denies any vomiting diarrhea or poor appetite. Now resolved. Awaiting skilled nursing facility placement.  AKI on chronic kidney disease stage III: Likely prerenal in etiology, resolved with IV fluid hydration her baseline creatinine is 1.0.    Atrial fibrillation, chronic CHADS-VASc is 44 (age x2, CVA x2)  Restart Eliquis.  Capacity:  Severe protein caloric malnutrition: Start IV fluids Ensure 3 times daily consult nutrition.   DVT prophylaxis: lovenox Family Communication: Daughter [phone] Disposition Plan/Barrier to D/C: snf today. Code Status:     Code Status Orders  (From admission, onward)         Start     Ordered   08/04/18 2051  Do not attempt resuscitation (DNR)  Continuous    Question Answer Comment  In the event of cardiac or respiratory ARREST Do not call a "code blue"   In the event of cardiac or respiratory ARREST Do not perform Intubation, CPR, defibrillation or ACLS   In the event of cardiac or respiratory ARREST Use medication by any route, position, wound care, and other measures to relive pain and suffering. May use oxygen, suction and manual treatment of airway obstruction as needed for comfort.      08/04/18 2052        Code Status History    Date Active Date Inactive Code Status Order ID Comments User Context   04/09/2018 2138 04/10/2018 1924 Full Code [id number]  Valarie Merino, MD ED   02/17/2018 0058 02/18/2018 1918 DNR [id number]  Vianne Bulls, MD ED   09/20/2017 0127 09/20/2017 1802 DNR [id number]  Gwynne Edinger, MD Inpatient   09/17/2017 0921 09/17/2017 2227 Full Code [id number]  Reubin Milan, MD Inpatient   12/02/2013 1714 12/10/2013 1433 Full Code [id number]  Cathlyn Parsons, PA-C Inpatient   11/29/2013 1835 12/02/2013 1714 Full Code [id number]  Adrian Prows, MD ED        IV Access:    Peripheral IV   Procedures and diagnostic studies:   No results found.   Medical Consultants:    None.  Anti-Infectives:  None  Subjective:    [name] has no complaints.  Objective:    Vitals:   08/07/18 0630 08/07/18 1349 08/07/18 2110 08/08/18 0524  BP:  (!) 137/94 116/75 (!) 105/56  Pulse:  (!) 56 (!) 127 73  Resp:  18 16 16   Temp:  98.2 F (36.8 C) 99 F (37.2 C) 98.1 F (36.7 C)  TempSrc:  Oral Oral Oral  SpO2:  92% 97% 96%  Weight: 52.3 kg   53.5 kg  Height:        Intake/Output Summary (Last 24 hours) at 08/08/2018 0855 Last data filed at 08/08/2018 0600 Gross per 24 hour  Intake 1945.74 ml  Output 600 ml  Net 1345.74 ml   Filed Weights   08/04/18 2141 08/07/18 0630 08/08/18 0524  Weight: 47.8 kg 52.3 kg 53.5 kg  Exam: General exam: In no acute distress, cachectic Respiratory system: Good air movement and clear to auscultation. Cardiovascular system: S1 & S2 heard, RRR.  Gastrointestinal system: Abdomen is nondistended, soft and nontender.  Central nervous system: Alert and oriented. No focal neurological deficits. Extremities: No pedal edema. Skin: No rashes, lesions or ulcers Psychiatry: Judgement and insight appear normal. Mood & affect appropriate.    Data Reviewed:    Labs: Basic Metabolic Panel: Recent Labs  Lab 08/04/18 1608 08/05/18 0522 08/06/18 0546 08/07/18 0539 08/08/18 0557  NA 141 140 138 137 136  K 4.8 4.3 4.0 4.2 3.8  CL 104 106 109 107 108  CO2 26 23 22 23 22   GLUCOSE 93  77 84 96 86  BUN 43* 35* 30* 29* 22  CREATININE 1.74* 1.54* 1.26* 1.20 1.09  CALCIUM 9.9 8.9 8.4* 8.1* 7.7*   GFR Estimated Creatinine Clearance: 31.4 mL/min (by C-G formula based on SCr of 1.09 mg/dL). Liver Function Tests: No results for input(s): AST, ALT, ALKPHOS, BILITOT, PROT, ALBUMIN in the last 168 hours. No results for input(s): LIPASE, AMYLASE in the last 168 hours. No results for input(s): AMMONIA in the last 168 hours. Coagulation profile No results for input(s): INR, PROTIME in the last 168 hours.  CBC: Recent Labs  Lab 08/04/18 1608  WBC 6.9  NEUTROABS 5.3  HGB 13.8  HCT 42.3  MCV 95.7  PLT 192   Cardiac Enzymes: No results for input(s): CKTOTAL, CKMB, CKMBINDEX, TROPONINI in the last 168 hours. BNP (last 3 results) No results for input(s): PROBNP in the last 8760 hours. CBG: Recent Labs  Lab 08/04/18 1428  GLUCAP 84   D-Dimer: No results for input(s): DDIMER in the last 72 hours. Hgb A1c: No results for input(s): HGBA1C in the last 72 hours. Lipid Profile: No results for input(s): CHOL, HDL, LDLCALC, TRIG, CHOLHDL, LDLDIRECT in the last 72 hours. Thyroid function studies: No results for input(s): TSH, T4TOTAL, T3FREE, THYROIDAB in the last 72 hours.  Invalid input(s): FREET3 Anemia work up: No results for input(s): VITAMINB12, FOLATE, FERRITIN, TIBC, IRON, RETICCTPCT in the last 72 hours. Sepsis Labs: Recent Labs  Lab 08/04/18 1608  WBC 6.9   Microbiology Recent Results (from the past 240 hour(s))  Urine Culture     Status: Abnormal   Collection Time: 08/04/18  7:19 PM  Result Value Ref Range Status   Specimen Description   Final    URINE, CLEAN CATCH Performed at [facility], [place] [address] [place], [place] [address]    Special Requests   Final    NONE Performed at [facility], [place] [address], [place], [place] [address]    Culture MULTIPLE SPECIES PRESENT, SUGGEST RECOLLECTION (A)  Final    Report Status 08/06/2018 FINAL  Final     Medications:   . apixaban  2.5 mg Oral BID  . feeding supplement (ENSURE ENLIVE)  237 mL Oral TID BM  . Melatonin  3 mg Oral Q24H  . pantoprazole  40 mg Oral BID  . sodium chloride flush  3 mL Intravenous Q12H   Continuous Infusions: . sodium chloride 75 mL/hr at 08/08/18 0358     LOS: 3 days   Charlynne Cousins  [facility] Pager [phone]  *Please refer to [email or web address], password [email or web address] to get updated schedule on who will round on this patient, as hospitalists switch teams weekly. If 7PM-7AM, please contact night-coverage at [email or web address], password [email or web address] for any overnight needs.  08/08/2018, 8:55 AM

## 2018-08-09 DIAGNOSIS — I10 Essential (primary) hypertension: Secondary | ICD-10-CM

## 2018-08-09 NOTE — Progress Notes (Signed)
[facility] PROGRESS NOTE    Progress Note  [name]  TIW:[id number] DOB: 1922/05/18 DOA: 08/04/2018 PCP: [facility]     Brief Narrative:   [name] is an 84 y.o. male past medical history of chronic kidney disease stage III, chronic atrial fibrillation not on anticoagulation, history of CVA presents to the emergency room as patient was unable to care for himself.  In the ED he was found afebrile saturating well with possibly atrial fibrillation, his heart rate improved in the ED after IV fluid.  Assessment/Plan:   Dehydration Denies any vomiting diarrhea or poor appetite. Now resolved. Awaiting skilled nursing facility placement.  AKI on chronic kidney disease stage III: Likely prerenal in etiology, resolved with IV fluid hydration her baseline creatinine is 1.0.    Atrial fibrillation, chronic CHADS-VASc is 70 (age x2, CVA x2)  Restart Eliquis.  Capacity: She lacks capacity to make medical decisions.  Severe protein caloric malnutrition: Start IV fluids Ensure 3 times daily consult nutrition.   DVT prophylaxis: lovenox Family Communication: Daughter [phone] Disposition Plan/Barrier to D/C: snf on 08/10/2018. Code Status:     Code Status Orders  (From admission, onward)         Start     Ordered   08/04/18 2051  Do not attempt resuscitation (DNR)  Continuous    Question Answer Comment  In the event of cardiac or respiratory ARREST Do not call a "code blue"   In the event of cardiac or respiratory ARREST Do not perform Intubation, CPR, defibrillation or ACLS   In the event of cardiac or respiratory ARREST Use medication by any route, position, wound care, and other measures to relive pain and suffering. May use oxygen, suction and manual treatment of airway obstruction as needed for comfort.      08/04/18 2052        Code Status History    Date Active Date Inactive Code Status Order ID Comments User Context   04/09/2018  2138 04/10/2018 1924 Full Code [id number]  Valarie Merino, MD ED   02/17/2018 0058 02/18/2018 1918 DNR [id number]  Vianne Bulls, MD ED   09/20/2017 0127 09/20/2017 1802 DNR [id number]  Gwynne Edinger, MD Inpatient   09/17/2017 0921 09/17/2017 2227 Full Code [id number]  Reubin Milan, MD Inpatient   12/02/2013 1714 12/10/2013 1433 Full Code [id number]  Cathlyn Parsons, PA-C Inpatient   11/29/2013 1835 12/02/2013 1714 Full Code [id number]  Adrian Prows, MD ED        IV Access:    Peripheral IV   Procedures and diagnostic studies:   No results found.   Medical Consultants:    None.  Anti-Infectives:  None  Subjective:    [name] has no complaints.  Objective:    Vitals:   08/08/18 0524 08/08/18 1351 08/08/18 2041 08/09/18 0525  BP: (!) 105/56 106/67 132/77 134/81  Pulse: 73 96 (!) 113 70  Resp: 16 16 16 16   Temp: 98.1 F (36.7 C) (!) 97.4 F (36.3 C) 98.2 F (36.8 C) 98 F (36.7 C)  TempSrc: Oral Oral Oral Oral  SpO2: 96% 97% 98% 97%  Weight: 53.5 kg   55.7 kg  Height:        Intake/Output Summary (Last 24 hours) at 08/09/2018 0829 Last data filed at 08/09/2018 0600 Gross per 24 hour  Intake 2336.89 ml  Output 525 ml  Net 1811.89 ml   Filed Weights   08/07/18 0630 08/08/18 0524 08/09/18  0525  Weight: 52.3 kg 53.5 kg 55.7 kg    Exam: General exam: In no acute distress, cachectic Respiratory system: Good air movement and clear to auscultation. Cardiovascular system: S1 & S2 heard, RRR.  Gastrointestinal system: Abdomen is nondistended, soft and nontender.  Central nervous system: Alert and oriented. No focal neurological deficits. Extremities: No pedal edema. Skin: No rashes, lesions or ulcers Psychiatry: Judgement and insight appear normal. Mood & affect appropriate.    Data Reviewed:    Labs: Basic Metabolic Panel: Recent Labs  Lab 08/04/18 1608 08/05/18 0522 08/06/18 0546 08/07/18 0539 08/08/18 0557  NA 141 140 138 137  136  K 4.8 4.3 4.0 4.2 3.8  CL 104 106 109 107 108  CO2 26 23 22 23 22   GLUCOSE 93 77 84 96 86  BUN 43* 35* 30* 29* 22  CREATININE 1.74* 1.54* 1.26* 1.20 1.09  CALCIUM 9.9 8.9 8.4* 8.1* 7.7*   GFR Estimated Creatinine Clearance: 32.6 mL/min (by C-G formula based on SCr of 1.09 mg/dL). Liver Function Tests: No results for input(s): AST, ALT, ALKPHOS, BILITOT, PROT, ALBUMIN in the last 168 hours. No results for input(s): LIPASE, AMYLASE in the last 168 hours. No results for input(s): AMMONIA in the last 168 hours. Coagulation profile No results for input(s): INR, PROTIME in the last 168 hours.  CBC: Recent Labs  Lab 08/04/18 1608  WBC 6.9  NEUTROABS 5.3  HGB 13.8  HCT 42.3  MCV 95.7  PLT 192   Cardiac Enzymes: No results for input(s): CKTOTAL, CKMB, CKMBINDEX, TROPONINI in the last 168 hours. BNP (last 3 results) No results for input(s): PROBNP in the last 8760 hours. CBG: Recent Labs  Lab 08/04/18 1428  GLUCAP 84   D-Dimer: No results for input(s): DDIMER in the last 72 hours. Hgb A1c: No results for input(s): HGBA1C in the last 72 hours. Lipid Profile: No results for input(s): CHOL, HDL, LDLCALC, TRIG, CHOLHDL, LDLDIRECT in the last 72 hours. Thyroid function studies: No results for input(s): TSH, T4TOTAL, T3FREE, THYROIDAB in the last 72 hours.  Invalid input(s): FREET3 Anemia work up: No results for input(s): VITAMINB12, FOLATE, FERRITIN, TIBC, IRON, RETICCTPCT in the last 72 hours. Sepsis Labs: Recent Labs  Lab 08/04/18 1608  WBC 6.9   Microbiology Recent Results (from the past 240 hour(s))  Urine Culture     Status: Abnormal   Collection Time: 08/04/18  7:19 PM  Result Value Ref Range Status   Specimen Description   Final    URINE, CLEAN CATCH Performed at [facility], [place] [address], [place], [place] [address]    Special Requests   Final    NONE Performed at [facility], [place] [address] [place],  [place] [address]    Culture MULTIPLE SPECIES PRESENT, SUGGEST RECOLLECTION (A)  Final   Report Status 08/06/2018 FINAL  Final     Medications:   . apixaban  2.5 mg Oral BID  . feeding supplement (ENSURE ENLIVE)  237 mL Oral TID BM  . Melatonin  3 mg Oral Q24H  . pantoprazole  40 mg Oral BID  . sodium chloride flush  3 mL Intravenous Q12H   Continuous Infusions: . sodium chloride 75 mL/hr at 08/09/18 0705     LOS: 4 days   Charlynne Cousins  [facility] Pager [phone]  *Please refer to [email or web address], password [email or web address] to get updated schedule on who will round on this patient, as hospitalists switch teams weekly. If 7PM-7AM, please contact night-coverage  at [email or web address], password [email or web address] for any overnight needs.  08/09/2018, 8:29 AM

## 2018-08-10 DIAGNOSIS — R279 Unspecified lack of coordination: Secondary | ICD-10-CM | POA: Diagnosis not present

## 2018-08-10 DIAGNOSIS — F331 Major depressive disorder, recurrent, moderate: Secondary | ICD-10-CM | POA: Diagnosis not present

## 2018-08-10 DIAGNOSIS — G3183 Dementia with Lewy bodies: Secondary | ICD-10-CM | POA: Diagnosis not present

## 2018-08-10 DIAGNOSIS — E7849 Other hyperlipidemia: Secondary | ICD-10-CM | POA: Diagnosis not present

## 2018-08-10 DIAGNOSIS — N183 Chronic kidney disease, stage 3 (moderate): Secondary | ICD-10-CM | POA: Diagnosis not present

## 2018-08-10 DIAGNOSIS — R531 Weakness: Secondary | ICD-10-CM | POA: Diagnosis not present

## 2018-08-10 DIAGNOSIS — Z743 Need for continuous supervision: Secondary | ICD-10-CM | POA: Diagnosis not present

## 2018-08-10 DIAGNOSIS — E860 Dehydration: Secondary | ICD-10-CM | POA: Diagnosis not present

## 2018-08-10 DIAGNOSIS — I482 Chronic atrial fibrillation, unspecified: Secondary | ICD-10-CM | POA: Diagnosis not present

## 2018-08-10 DIAGNOSIS — G4700 Insomnia, unspecified: Secondary | ICD-10-CM | POA: Diagnosis not present

## 2018-08-10 DIAGNOSIS — E43 Unspecified severe protein-calorie malnutrition: Secondary | ICD-10-CM | POA: Diagnosis not present

## 2018-08-10 DIAGNOSIS — F5109 Other insomnia not due to a substance or known physiological condition: Secondary | ICD-10-CM | POA: Diagnosis not present

## 2018-08-10 DIAGNOSIS — G459 Transient cerebral ischemic attack, unspecified: Secondary | ICD-10-CM | POA: Diagnosis not present

## 2018-08-10 DIAGNOSIS — M6281 Muscle weakness (generalized): Secondary | ICD-10-CM | POA: Diagnosis not present

## 2018-08-10 DIAGNOSIS — I6990 Unspecified sequelae of unspecified cerebrovascular disease: Secondary | ICD-10-CM | POA: Diagnosis not present

## 2018-08-10 DIAGNOSIS — N179 Acute kidney failure, unspecified: Secondary | ICD-10-CM | POA: Diagnosis not present

## 2018-08-10 DIAGNOSIS — R262 Difficulty in walking, not elsewhere classified: Secondary | ICD-10-CM | POA: Diagnosis not present

## 2018-08-10 DIAGNOSIS — R41841 Cognitive communication deficit: Secondary | ICD-10-CM | POA: Diagnosis not present

## 2018-08-10 DIAGNOSIS — F0391 Unspecified dementia with behavioral disturbance: Secondary | ICD-10-CM | POA: Diagnosis not present

## 2018-08-10 DIAGNOSIS — E440 Moderate protein-calorie malnutrition: Secondary | ICD-10-CM | POA: Diagnosis not present

## 2018-08-10 NOTE — Progress Notes (Signed)
Patient was stable at discharge.

## 2018-08-10 NOTE — Discharge Summary (Signed)
Physician Discharge Summary  [name]:[id number] DOB: 1922/07/09 DOA: 08/04/2018  PCP: [facility]  Admit date: 08/04/2018 Discharge date: 08/10/2018  Admitted From: Home Disposition:  SNF  Recommendations for Outpatient Follow-up:  1. Follow up with PCP in 1-2 weeks 2. Please obtain BMP/CBC in one week   Home Health:No Equipment/Devices:None  Discharge Condition:Guarded CODE STATUS:DNR Diet recommendation: Heart Healthy   Brief/Interim Summary: 84 y.o. male past medical history of chronic kidney disease stage III, chronic atrial fibrillation not on anticoagulation, history of CVA presents to the emergency room as patient was unable to care for himself.  In the ED he was found afebrile saturating well with possibly atrial fibrillation, his heart rate improved in the ED after IV fluid.  Discharge Diagnoses:  Principal Problem:   Dehydration Active Problems:   Atrial fibrillation, chronic   History of completed stroke   Hypertension   CKD (chronic kidney disease), stage III (HCC)   Protein-calorie malnutrition, severe   Pressure injury of skin   Sacral decubitus ulcer, stage II (HCC)  Dehydration: Patient denies any vomiting diarrhea or nausea with poor appetite not able to take care of himself he was hypokalemic on admission with a mild rise in BUN and creatinine which improved with IV fluid hydration. Physical therapy evaluated the patient the recommended skilled nursing facility. Patient will go to skilled nursing facility [place] [facility]  Acute on chronic kidney disease stage III: Likely prerenal in etiology resolved with IV fluid hydration.  Chronic atrial fibrillation: Continue Eliquis, CHADS-VASc is 4   Capacity: The patient does not seem to have any insight on his medical condition and his outcomes, have explained to him the choices and consequences of his choices he is not able to explain them back to me. He cannot tell me in  his own words his medical conditions that he has or anything related to them. As per daughter as he does not allow healthcare assistance physical therapy or home health to go at home or inside his home to help him. Speaking also with his daughter they have been complaining of hallucinations at home regularly he is called 1 of his daughters telling him that the other one has been dead. I spoke with a healthcare power of attorney and she agreed that he needs to be at the facility. The patient obviously lacks capacity and cannot care for himself at (have found coccus of dead animals in his house.  Severe protein caloric malnutrition: Continue Ensure 3 times daily as an outpatient.   Discharge Instructions  Discharge Instructions    Diet - low sodium heart healthy   Complete by:  As directed    Diet - low sodium heart healthy   Complete by:  As directed    Increase activity slowly   Complete by:  As directed    Increase activity slowly   Complete by:  As directed      Allergies as of 08/10/2018      Reactions   Codeine Nausea And Vomiting      Medication List    STOP taking these medications   pantoprazole 40 MG tablet Commonly known as:  PROTONIX     TAKE these medications   apixaban 2.5 MG Tabs tablet Commonly known as:  ELIQUIS Take 1 tablet (2.5 mg total) by mouth 2 (two) times daily.   pravastatin 20 MG tablet Commonly known as:  PRAVACHOL Take 1 tablet (20 mg total) by mouth at bedtime.  Allergies  Allergen Reactions  . Codeine Nausea And Vomiting    Consultations:  None   Procedures/Studies: Dg Chest 2 View  Result Date: 08/05/2018 CLINICAL DATA:  Aspiration pneumonia EXAM: CHEST - 2 VIEW COMPARISON:  June 16, 2018 FINDINGS: The heart size and mediastinal contours are within normal limits. The lungs are hyperinflated. Both lungs are clear. The visualized skeletal structures are stable. IMPRESSION: No active cardiopulmonary disease.  Hyperinflated  lungs. Electronically Signed   By: Abelardo Diesel M.D.   On: 08/05/2018 19:57    Subjective: No new complaints.  Discharge Exam: Vitals:   08/09/18 2055 08/10/18 0554  BP: 113/77 139/70  Pulse: (!) 107 64  Resp: 18 18  Temp: 97.6 F (36.4 C) 97.9 F (36.6 C)  SpO2: 97% 97%   Vitals:   08/09/18 1542 08/09/18 2055 08/10/18 0552 08/10/18 0554  BP: 103/75 113/77  139/70  Pulse: 66 (!) 107  64  Resp: 17 18  18   Temp: 97.7 F (36.5 C) 97.6 F (36.4 C)  97.9 F (36.6 C)  TempSrc: Oral Oral  Oral  SpO2: 97% 97%  97%  Weight:   56.9 kg   Height:        General: Pt is alert, awake, not in acute distress Cardiovascular: RRR, S1/S2 +, no rubs, no gallops Respiratory: CTA bilaterally, no wheezing, no rhonchi Abdominal: Soft, NT, ND, bowel sounds + Extremities: no edema, no cyanosis    The results of significant diagnostics from this hospitalization (including imaging, microbiology, ancillary and laboratory) are listed below for reference.     Microbiology: Recent Results (from the past 240 hour(s))  Urine Culture     Status: Abnormal   Collection Time: 08/04/18  7:19 PM  Result Value Ref Range Status   Specimen Description   Final    URINE, CLEAN CATCH Performed at [facility], [place] [address] [place], [place] [address]    Special Requests   Final    NONE Performed at [facility], [place] [address] [place], [place] [address]    Culture MULTIPLE SPECIES PRESENT, SUGGEST RECOLLECTION (A)  Final   Report Status 08/06/2018 FINAL  Final     Labs: BNP (last 3 results) No results for input(s): BNP in the last 8760 hours. Basic Metabolic Panel: Recent Labs  Lab 08/04/18 1608 08/05/18 0522 08/06/18 0546 08/07/18 0539 08/08/18 0557  NA 141 140 138 137 136  K 4.8 4.3 4.0 4.2 3.8  CL 104 106 109 107 108  CO2 26 23 22 23 22   GLUCOSE 93 77 84 96 86  BUN 43* 35* 30* 29* 22  CREATININE 1.74* 1.54* 1.26* 1.20 1.09  CALCIUM 9.9 8.9  8.4* 8.1* 7.7*   Liver Function Tests: No results for input(s): AST, ALT, ALKPHOS, BILITOT, PROT, ALBUMIN in the last 168 hours. No results for input(s): LIPASE, AMYLASE in the last 168 hours. No results for input(s): AMMONIA in the last 168 hours. CBC: Recent Labs  Lab 08/04/18 1608  WBC 6.9  NEUTROABS 5.3  HGB 13.8  HCT 42.3  MCV 95.7  PLT 192   Cardiac Enzymes: No results for input(s): CKTOTAL, CKMB, CKMBINDEX, TROPONINI in the last 168 hours. BNP: Invalid input(s): POCBNP CBG: Recent Labs  Lab 08/04/18 1428  GLUCAP 84   D-Dimer No results for input(s): DDIMER in the last 72 hours. Hgb A1c No results for input(s): HGBA1C in the last 72 hours. Lipid Profile No results for input(s): CHOL, HDL, LDLCALC, TRIG, CHOLHDL, LDLDIRECT  in the last 72 hours. Thyroid function studies No results for input(s): TSH, T4TOTAL, T3FREE, THYROIDAB in the last 72 hours.  Invalid input(s): FREET3 Anemia work up No results for input(s): VITAMINB12, FOLATE, FERRITIN, TIBC, IRON, RETICCTPCT in the last 72 hours. Urinalysis    Component Value Date/Time   COLORURINE YELLOW 08/04/2018 1919   APPEARANCEUR CLEAR 08/04/2018 1919   LABSPEC 1.020 08/04/2018 1919   PHURINE 5.0 08/04/2018 1919   GLUCOSEU NEGATIVE 08/04/2018 1919   HGBUR NEGATIVE 08/04/2018 [place] NEGATIVE 08/04/2018 1919   KETONESUR 5 (A) 08/04/2018 1919   PROTEINUR NEGATIVE 08/04/2018 1919   UROBILINOGEN 0.2 11/29/2013 1619   NITRITE NEGATIVE 08/04/2018 1919   LEUKOCYTESUR TRACE (A) 08/04/2018 1919   Sepsis Labs Invalid input(s): PROCALCITONIN,  WBC,  LACTICIDVEN Microbiology Recent Results (from the past 240 hour(s))  Urine Culture     Status: Abnormal   Collection Time: 08/04/18  7:19 PM  Result Value Ref Range Status   Specimen Description   Final    URINE, CLEAN CATCH Performed at [facility], [place] [address], [place], [place] [address]    Special Requests   Final     NONE Performed at [facility], [place] [address], [place],  [address]    Culture MULTIPLE SPECIES PRESENT, SUGGEST RECOLLECTION (A)  Final   Report Status 08/06/2018 FINAL  Final     Time coordinating discharge: Over 30 minutes  SIGNED:   Charlynne Cousins, MD  [facility] 08/10/2018, 7:47 AM Pager   If 7PM-7AM, please contact night-coverage [email or web address] Password [email or web address]

## 2018-08-10 NOTE — Progress Notes (Signed)
PT Cancellation Note  Patient Details Name: [name] MRN: [id number] DOB: 01/28/1922   Cancelled Treatment:    Reason Eval/Treat Not Completed: Patient declined, no reason specified Pt was just finishing breakfast and declines to participate, "just not feeling up to it."  Pt likely to d/c to SNF today.   Daksh Coates,KATHrine E 08/10/2018, 11:02 AM Carmelia Bake, PT, DPT Acute Rehabilitation Services Office: [phone] Pager: [phone]

## 2018-08-10 NOTE — Clinical Social Work Placement (Signed)
Patient received and accepted bed offer at [facility] SNF. Facility aware of discharge and confirmed bed offer. PTAR contacted, patient's daughter notified. Patient's RN can call report to [phone] ask for Roderic Palau, packet complete. CSW signing off, no other needs identified at this time.  CLINICAL SOCIAL WORK PLACEMENT  NOTE  Date:  08/10/2018  Patient Details  Name: [name] MRN: [id number] Date of Birth: [birth year]  Clinical Social Work is seeking post-discharge placement for this patient at the [place] level of care (*CSW will initial, date and re-position this form in  chart as items are completed):  Yes   Patient/family provided with [place] Work Department's list of facilities offering this level of care within the geographic area requested by the patient (or if unable, by the patient's family).  Yes   Patient/family informed of their freedom to choose among providers that offer the needed level of care, that participate in Medicare, Medicaid or managed care program needed by the patient, have an available bed and are willing to accept the patient.  Yes   Patient/family informed of [facility]'s ownership interest in [facility] and [facility], as well as of the fact that they are under no obligation to receive care at these facilities.  PASRR submitted to EDS on       PASRR number received on       Existing PASRR number confirmed on 08/06/18     FL2 transmitted to all facilities in geographic area requested by pt/family on 08/06/18     FL2 transmitted to all facilities within larger geographic area on       Patient informed that his/her managed care company has contracts with or will negotiate with certain facilities, including the following:        Yes   Patient/family informed of bed offers received.  Patient chooses bed at Other - please specify in the comment section below:([place])     Physician recommends and patient chooses bed at      Patient to be transferred to Other - please specify in the comment section below:( [place]) on 08/10/18.  Patient to be transferred to facility by PTAR     Patient family notified on 08/10/18 of transfer.  Name of family member notified:  Gae Bon     PHYSICIAN       Additional Comment:    _______________________________________________ Burnis Medin, LCSW 08/10/2018, 10:46 AM

## 2018-08-10 NOTE — Care Management Note (Signed)
Case Management Note  Patient Details  Name: BERTHA EARWOOD MRN: [id number] Date of Birth: 03/07/1922  Subjective/Objective: dc SNF. Medically stable.CSW already following.                   Action/Plan:dc SNF.   Expected Discharge Date:  08/08/18               Expected Discharge Plan:  Skilled Nursing Facility  In-House Referral:  Clinical Social Work  Discharge planning Services  CM Consult  Post Acute Care Choice:    Choice offered to:     DME Arranged:    DME Agency:     HH Arranged:    [place] Agency:     Status of Service:  Completed, signed off  If discussed at [employer], dates discussed:    Additional Comments:  Dessa Phi, RN 08/10/2018, 10:05 AM

## 2018-08-11 DIAGNOSIS — E440 Moderate protein-calorie malnutrition: Secondary | ICD-10-CM | POA: Diagnosis not present

## 2018-08-11 DIAGNOSIS — G3183 Dementia with Lewy bodies: Secondary | ICD-10-CM | POA: Diagnosis not present

## 2018-08-11 DIAGNOSIS — N183 Chronic kidney disease, stage 3 (moderate): Secondary | ICD-10-CM | POA: Diagnosis not present

## 2018-09-08 DIAGNOSIS — F0391 Unspecified dementia with behavioral disturbance: Secondary | ICD-10-CM | POA: Diagnosis not present

## 2018-09-08 DIAGNOSIS — F331 Major depressive disorder, recurrent, moderate: Secondary | ICD-10-CM | POA: Diagnosis not present

## 2018-09-08 DIAGNOSIS — F5109 Other insomnia not due to a substance or known physiological condition: Secondary | ICD-10-CM | POA: Diagnosis not present

## 2018-09-11 DIAGNOSIS — G3183 Dementia with Lewy bodies: Secondary | ICD-10-CM | POA: Diagnosis not present

## 2018-09-11 DIAGNOSIS — N183 Chronic kidney disease, stage 3 (moderate): Secondary | ICD-10-CM | POA: Diagnosis not present

## 2018-09-11 DIAGNOSIS — E440 Moderate protein-calorie malnutrition: Secondary | ICD-10-CM | POA: Diagnosis not present

## 2018-09-11 DIAGNOSIS — I482 Chronic atrial fibrillation, unspecified: Secondary | ICD-10-CM | POA: Diagnosis not present

## 2018-09-28 DIAGNOSIS — I4891 Unspecified atrial fibrillation: Secondary | ICD-10-CM | POA: Diagnosis not present

## 2018-09-28 DIAGNOSIS — E43 Unspecified severe protein-calorie malnutrition: Secondary | ICD-10-CM | POA: Diagnosis not present

## 2018-09-28 DIAGNOSIS — C44619 Basal cell carcinoma of skin of left upper limb, including shoulder: Secondary | ICD-10-CM | POA: Diagnosis not present

## 2018-10-06 DIAGNOSIS — G301 Alzheimer's disease with late onset: Secondary | ICD-10-CM | POA: Diagnosis not present

## 2018-10-06 DIAGNOSIS — F331 Major depressive disorder, recurrent, moderate: Secondary | ICD-10-CM | POA: Diagnosis not present

## 2018-10-06 DIAGNOSIS — F0280 Dementia in other diseases classified elsewhere without behavioral disturbance: Secondary | ICD-10-CM | POA: Diagnosis not present

## 2018-10-06 DIAGNOSIS — F5109 Other insomnia not due to a substance or known physiological condition: Secondary | ICD-10-CM | POA: Diagnosis not present

## 2018-10-07 DIAGNOSIS — I6990 Unspecified sequelae of unspecified cerebrovascular disease: Secondary | ICD-10-CM | POA: Diagnosis not present

## 2018-10-07 DIAGNOSIS — M6281 Muscle weakness (generalized): Secondary | ICD-10-CM | POA: Diagnosis not present

## 2018-10-07 DIAGNOSIS — R296 Repeated falls: Secondary | ICD-10-CM | POA: Diagnosis not present

## 2018-10-07 DIAGNOSIS — E7849 Other hyperlipidemia: Secondary | ICD-10-CM | POA: Diagnosis not present

## 2018-10-07 DIAGNOSIS — G4700 Insomnia, unspecified: Secondary | ICD-10-CM | POA: Diagnosis not present

## 2018-10-07 DIAGNOSIS — R262 Difficulty in walking, not elsewhere classified: Secondary | ICD-10-CM | POA: Diagnosis not present

## 2018-10-07 DIAGNOSIS — I482 Chronic atrial fibrillation, unspecified: Secondary | ICD-10-CM | POA: Diagnosis not present

## 2018-10-07 DIAGNOSIS — E43 Unspecified severe protein-calorie malnutrition: Secondary | ICD-10-CM | POA: Diagnosis not present

## 2018-10-07 DIAGNOSIS — K219 Gastro-esophageal reflux disease without esophagitis: Secondary | ICD-10-CM | POA: Diagnosis not present

## 2018-10-07 DIAGNOSIS — R41841 Cognitive communication deficit: Secondary | ICD-10-CM | POA: Diagnosis not present

## 2018-10-08 DIAGNOSIS — I482 Chronic atrial fibrillation, unspecified: Secondary | ICD-10-CM | POA: Diagnosis not present

## 2018-10-14 DIAGNOSIS — I482 Chronic atrial fibrillation, unspecified: Secondary | ICD-10-CM | POA: Diagnosis not present

## 2018-10-14 DIAGNOSIS — G3183 Dementia with Lewy bodies: Secondary | ICD-10-CM | POA: Diagnosis not present

## 2018-10-14 DIAGNOSIS — N183 Chronic kidney disease, stage 3 (moderate): Secondary | ICD-10-CM | POA: Diagnosis not present

## 2018-10-14 DIAGNOSIS — E440 Moderate protein-calorie malnutrition: Secondary | ICD-10-CM | POA: Diagnosis not present

## 2018-10-16 DIAGNOSIS — R0781 Pleurodynia: Secondary | ICD-10-CM | POA: Diagnosis not present

## 2018-10-20 DIAGNOSIS — F331 Major depressive disorder, recurrent, moderate: Secondary | ICD-10-CM | POA: Diagnosis not present

## 2018-10-20 DIAGNOSIS — F0280 Dementia in other diseases classified elsewhere without behavioral disturbance: Secondary | ICD-10-CM | POA: Diagnosis not present

## 2018-10-20 DIAGNOSIS — F5109 Other insomnia not due to a substance or known physiological condition: Secondary | ICD-10-CM | POA: Diagnosis not present

## 2018-10-20 DIAGNOSIS — G301 Alzheimer's disease with late onset: Secondary | ICD-10-CM | POA: Diagnosis not present

## 2018-10-22 DIAGNOSIS — I482 Chronic atrial fibrillation, unspecified: Secondary | ICD-10-CM | POA: Diagnosis not present

## 2018-10-27 DIAGNOSIS — F5109 Other insomnia not due to a substance or known physiological condition: Secondary | ICD-10-CM | POA: Diagnosis not present

## 2018-10-27 DIAGNOSIS — F331 Major depressive disorder, recurrent, moderate: Secondary | ICD-10-CM | POA: Diagnosis not present

## 2018-10-29 DIAGNOSIS — E440 Moderate protein-calorie malnutrition: Secondary | ICD-10-CM | POA: Diagnosis not present

## 2018-10-29 DIAGNOSIS — N183 Chronic kidney disease, stage 3 (moderate): Secondary | ICD-10-CM | POA: Diagnosis not present

## 2018-10-29 DIAGNOSIS — I482 Chronic atrial fibrillation, unspecified: Secondary | ICD-10-CM | POA: Diagnosis not present

## 2018-10-29 DIAGNOSIS — G3183 Dementia with Lewy bodies: Secondary | ICD-10-CM | POA: Diagnosis not present

## 2018-11-03 DIAGNOSIS — F331 Major depressive disorder, recurrent, moderate: Secondary | ICD-10-CM | POA: Diagnosis not present

## 2018-11-03 DIAGNOSIS — G301 Alzheimer's disease with late onset: Secondary | ICD-10-CM | POA: Diagnosis not present

## 2018-11-03 DIAGNOSIS — F5109 Other insomnia not due to a substance or known physiological condition: Secondary | ICD-10-CM | POA: Diagnosis not present

## 2018-11-03 DIAGNOSIS — F0280 Dementia in other diseases classified elsewhere without behavioral disturbance: Secondary | ICD-10-CM | POA: Diagnosis not present

## 2018-11-18 DIAGNOSIS — I482 Chronic atrial fibrillation, unspecified: Secondary | ICD-10-CM | POA: Diagnosis not present

## 2018-11-18 DIAGNOSIS — Z8669 Personal history of other diseases of the nervous system and sense organs: Secondary | ICD-10-CM | POA: Diagnosis not present

## 2018-11-18 DIAGNOSIS — H9193 Unspecified hearing loss, bilateral: Secondary | ICD-10-CM | POA: Diagnosis not present

## 2018-11-18 DIAGNOSIS — E440 Moderate protein-calorie malnutrition: Secondary | ICD-10-CM | POA: Diagnosis not present

## 2018-11-18 DIAGNOSIS — N183 Chronic kidney disease, stage 3 (moderate): Secondary | ICD-10-CM | POA: Diagnosis not present

## 2018-11-18 DIAGNOSIS — G3183 Dementia with Lewy bodies: Secondary | ICD-10-CM | POA: Diagnosis not present

## 2018-11-19 DIAGNOSIS — I482 Chronic atrial fibrillation, unspecified: Secondary | ICD-10-CM | POA: Diagnosis not present

## 2018-11-27 DIAGNOSIS — F0391 Unspecified dementia with behavioral disturbance: Secondary | ICD-10-CM | POA: Diagnosis not present

## 2018-11-27 DIAGNOSIS — F331 Major depressive disorder, recurrent, moderate: Secondary | ICD-10-CM | POA: Diagnosis not present

## 2018-12-01 DIAGNOSIS — F0280 Dementia in other diseases classified elsewhere without behavioral disturbance: Secondary | ICD-10-CM | POA: Diagnosis not present

## 2018-12-01 DIAGNOSIS — G301 Alzheimer's disease with late onset: Secondary | ICD-10-CM | POA: Diagnosis not present

## 2018-12-01 DIAGNOSIS — F5109 Other insomnia not due to a substance or known physiological condition: Secondary | ICD-10-CM | POA: Diagnosis not present

## 2018-12-01 DIAGNOSIS — F331 Major depressive disorder, recurrent, moderate: Secondary | ICD-10-CM | POA: Diagnosis not present

## 2018-12-26 DIAGNOSIS — F331 Major depressive disorder, recurrent, moderate: Secondary | ICD-10-CM | POA: Diagnosis not present

## 2018-12-26 DIAGNOSIS — G301 Alzheimer's disease with late onset: Secondary | ICD-10-CM | POA: Diagnosis not present

## 2018-12-29 DIAGNOSIS — F0280 Dementia in other diseases classified elsewhere without behavioral disturbance: Secondary | ICD-10-CM | POA: Diagnosis not present

## 2018-12-29 DIAGNOSIS — G301 Alzheimer's disease with late onset: Secondary | ICD-10-CM | POA: Diagnosis not present

## 2018-12-29 DIAGNOSIS — F5109 Other insomnia not due to a substance or known physiological condition: Secondary | ICD-10-CM | POA: Diagnosis not present

## 2018-12-29 DIAGNOSIS — F331 Major depressive disorder, recurrent, moderate: Secondary | ICD-10-CM | POA: Diagnosis not present

## 2019-01-14 DIAGNOSIS — E440 Moderate protein-calorie malnutrition: Secondary | ICD-10-CM | POA: Diagnosis not present

## 2019-01-14 DIAGNOSIS — G3183 Dementia with Lewy bodies: Secondary | ICD-10-CM | POA: Diagnosis not present

## 2019-01-14 DIAGNOSIS — I482 Chronic atrial fibrillation, unspecified: Secondary | ICD-10-CM | POA: Diagnosis not present

## 2019-01-14 DIAGNOSIS — N183 Chronic kidney disease, stage 3 (moderate): Secondary | ICD-10-CM | POA: Diagnosis not present

## 2019-01-22 DIAGNOSIS — M6281 Muscle weakness (generalized): Secondary | ICD-10-CM | POA: Diagnosis not present

## 2019-01-22 DIAGNOSIS — E7849 Other hyperlipidemia: Secondary | ICD-10-CM | POA: Diagnosis not present

## 2019-01-22 DIAGNOSIS — G301 Alzheimer's disease with late onset: Secondary | ICD-10-CM | POA: Diagnosis not present

## 2019-01-22 DIAGNOSIS — R296 Repeated falls: Secondary | ICD-10-CM | POA: Diagnosis not present

## 2019-01-22 DIAGNOSIS — R262 Difficulty in walking, not elsewhere classified: Secondary | ICD-10-CM | POA: Diagnosis not present

## 2019-01-22 DIAGNOSIS — F339 Major depressive disorder, recurrent, unspecified: Secondary | ICD-10-CM | POA: Diagnosis not present

## 2019-01-22 DIAGNOSIS — I6990 Unspecified sequelae of unspecified cerebrovascular disease: Secondary | ICD-10-CM | POA: Diagnosis not present

## 2019-01-22 DIAGNOSIS — R0989 Other specified symptoms and signs involving the circulatory and respiratory systems: Secondary | ICD-10-CM | POA: Diagnosis not present

## 2019-01-22 DIAGNOSIS — F331 Major depressive disorder, recurrent, moderate: Secondary | ICD-10-CM | POA: Diagnosis not present

## 2019-01-22 DIAGNOSIS — F0280 Dementia in other diseases classified elsewhere without behavioral disturbance: Secondary | ICD-10-CM | POA: Diagnosis not present

## 2019-01-22 DIAGNOSIS — I482 Chronic atrial fibrillation, unspecified: Secondary | ICD-10-CM | POA: Diagnosis not present

## 2019-01-22 DIAGNOSIS — F411 Generalized anxiety disorder: Secondary | ICD-10-CM | POA: Diagnosis not present

## 2019-01-22 DIAGNOSIS — R41841 Cognitive communication deficit: Secondary | ICD-10-CM | POA: Diagnosis not present

## 2019-01-22 DIAGNOSIS — F5109 Other insomnia not due to a substance or known physiological condition: Secondary | ICD-10-CM | POA: Diagnosis not present

## 2019-01-22 DIAGNOSIS — E43 Unspecified severe protein-calorie malnutrition: Secondary | ICD-10-CM | POA: Diagnosis not present

## 2019-01-22 DIAGNOSIS — K219 Gastro-esophageal reflux disease without esophagitis: Secondary | ICD-10-CM | POA: Diagnosis not present

## 2019-01-22 DIAGNOSIS — R509 Fever, unspecified: Secondary | ICD-10-CM | POA: Diagnosis not present

## 2019-01-22 DIAGNOSIS — G4700 Insomnia, unspecified: Secondary | ICD-10-CM | POA: Diagnosis not present

## 2019-01-26 DIAGNOSIS — G301 Alzheimer's disease with late onset: Secondary | ICD-10-CM | POA: Diagnosis not present

## 2019-01-26 DIAGNOSIS — F331 Major depressive disorder, recurrent, moderate: Secondary | ICD-10-CM | POA: Diagnosis not present

## 2019-01-26 DIAGNOSIS — F0280 Dementia in other diseases classified elsewhere without behavioral disturbance: Secondary | ICD-10-CM | POA: Diagnosis not present

## 2019-01-26 DIAGNOSIS — F5109 Other insomnia not due to a substance or known physiological condition: Secondary | ICD-10-CM | POA: Diagnosis not present

## 2019-03-29 DEATH — deceased
